# Patient Record
Sex: Female | Born: 1980 | Race: Black or African American | Hispanic: No | Marital: Married | Smoking: Former smoker
Health system: Southern US, Community
[De-identification: ages and names within clinical notes are randomized; demographics above are authoritative.]

## PROBLEM LIST (undated history)

## (undated) DIAGNOSIS — F410 Panic disorder [episodic paroxysmal anxiety] without agoraphobia: Secondary | ICD-10-CM

## (undated) DIAGNOSIS — F419 Anxiety disorder, unspecified: Secondary | ICD-10-CM

## (undated) DIAGNOSIS — Z9889 Other specified postprocedural states: Secondary | ICD-10-CM

## (undated) DIAGNOSIS — M797 Fibromyalgia: Secondary | ICD-10-CM

## (undated) DIAGNOSIS — F99 Mental disorder, not otherwise specified: Secondary | ICD-10-CM

## (undated) DIAGNOSIS — E785 Hyperlipidemia, unspecified: Secondary | ICD-10-CM

## (undated) DIAGNOSIS — F32A Depression, unspecified: Secondary | ICD-10-CM

## (undated) DIAGNOSIS — K5900 Constipation, unspecified: Secondary | ICD-10-CM

## (undated) DIAGNOSIS — M549 Dorsalgia, unspecified: Secondary | ICD-10-CM

## (undated) DIAGNOSIS — D649 Anemia, unspecified: Secondary | ICD-10-CM

## (undated) DIAGNOSIS — F329 Major depressive disorder, single episode, unspecified: Secondary | ICD-10-CM

## (undated) DIAGNOSIS — Z9289 Personal history of other medical treatment: Secondary | ICD-10-CM

## (undated) DIAGNOSIS — R112 Nausea with vomiting, unspecified: Secondary | ICD-10-CM

## (undated) HISTORY — DX: Constipation, unspecified: K59.00

## (undated) HISTORY — DX: Fibromyalgia: M79.7

## (undated) HISTORY — DX: Mental disorder, not otherwise specified: F99

## (undated) HISTORY — DX: Dorsalgia, unspecified: M54.9

## (undated) HISTORY — PX: TUBAL LIGATION: SHX77

## (undated) SURGERY — Surgical Case
Anesthesia: *Unknown

---
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
## 2001-12-14 ENCOUNTER — Emergency Department (HOSPITAL_COMMUNITY): Admission: EM | Admit: 2001-12-14 | Discharge: 2001-12-14 | Payer: Self-pay | Admitting: Emergency Medicine

## 2001-12-15 ENCOUNTER — Emergency Department (HOSPITAL_COMMUNITY): Admission: EM | Admit: 2001-12-15 | Discharge: 2001-12-16 | Payer: Self-pay | Admitting: Emergency Medicine

## 2002-03-28 ENCOUNTER — Emergency Department (HOSPITAL_COMMUNITY): Admission: EM | Admit: 2002-03-28 | Discharge: 2002-03-28 | Payer: Self-pay | Admitting: Emergency Medicine

## 2002-05-15 ENCOUNTER — Inpatient Hospital Stay (HOSPITAL_COMMUNITY): Admission: AD | Admit: 2002-05-15 | Discharge: 2002-05-15 | Payer: Self-pay | Admitting: *Deleted

## 2002-05-15 ENCOUNTER — Encounter: Payer: Self-pay | Admitting: *Deleted

## 2002-05-18 ENCOUNTER — Inpatient Hospital Stay (HOSPITAL_COMMUNITY): Admission: AD | Admit: 2002-05-18 | Discharge: 2002-05-18 | Payer: Self-pay | Admitting: *Deleted

## 2002-06-06 ENCOUNTER — Inpatient Hospital Stay (HOSPITAL_COMMUNITY): Admission: AD | Admit: 2002-06-06 | Discharge: 2002-06-06 | Payer: Self-pay | Admitting: *Deleted

## 2002-06-11 ENCOUNTER — Inpatient Hospital Stay (HOSPITAL_COMMUNITY): Admission: AD | Admit: 2002-06-11 | Discharge: 2002-06-11 | Payer: Self-pay | Admitting: Family Medicine

## 2002-06-12 ENCOUNTER — Encounter: Payer: Self-pay | Admitting: *Deleted

## 2002-06-15 DIAGNOSIS — Z9289 Personal history of other medical treatment: Secondary | ICD-10-CM

## 2002-06-15 HISTORY — DX: Personal history of other medical treatment: Z92.89

## 2002-06-16 ENCOUNTER — Inpatient Hospital Stay (HOSPITAL_COMMUNITY): Admission: AD | Admit: 2002-06-16 | Discharge: 2002-06-16 | Payer: Self-pay | Admitting: *Deleted

## 2002-06-19 ENCOUNTER — Encounter (INDEPENDENT_AMBULATORY_CARE_PROVIDER_SITE_OTHER): Payer: Self-pay | Admitting: Specialist

## 2002-06-19 ENCOUNTER — Inpatient Hospital Stay (HOSPITAL_COMMUNITY): Admission: AD | Admit: 2002-06-19 | Discharge: 2002-06-23 | Payer: Self-pay | Admitting: Family Medicine

## 2002-06-24 ENCOUNTER — Inpatient Hospital Stay (HOSPITAL_COMMUNITY): Admission: AD | Admit: 2002-06-24 | Discharge: 2002-06-26 | Payer: Self-pay | Admitting: Obstetrics and Gynecology

## 2002-06-28 ENCOUNTER — Inpatient Hospital Stay (HOSPITAL_COMMUNITY): Admission: AD | Admit: 2002-06-28 | Discharge: 2002-06-28 | Payer: Self-pay | Admitting: Family Medicine

## 2003-10-24 ENCOUNTER — Inpatient Hospital Stay (HOSPITAL_COMMUNITY): Admission: AD | Admit: 2003-10-24 | Discharge: 2003-10-24 | Payer: Self-pay | Admitting: Obstetrics & Gynecology

## 2003-11-07 ENCOUNTER — Inpatient Hospital Stay (HOSPITAL_COMMUNITY): Admission: AD | Admit: 2003-11-07 | Discharge: 2003-11-07 | Payer: Self-pay | Admitting: Obstetrics & Gynecology

## 2003-11-07 IMAGING — US US OB COMP LESS 14 WK
1 series · 19 of 28 positions shown · non-contrast
Comparison: none

CLINICAL DATA: Recurrent urinary tract infection/positive urine pregnancy test.
 EARLY OBSTETRICAL ULTRASOUND:
 There is a single fetus with an estimated gestational age of 8 weeks 6 days based on crown rump length.  Heart rate is 176 bpm.  Amniotic fluid is subjectively normal.  Yolk sac is visualized.

[Series 1: us ob comp<14 wk · 19 of 35 slices shown]
[im 1/35]
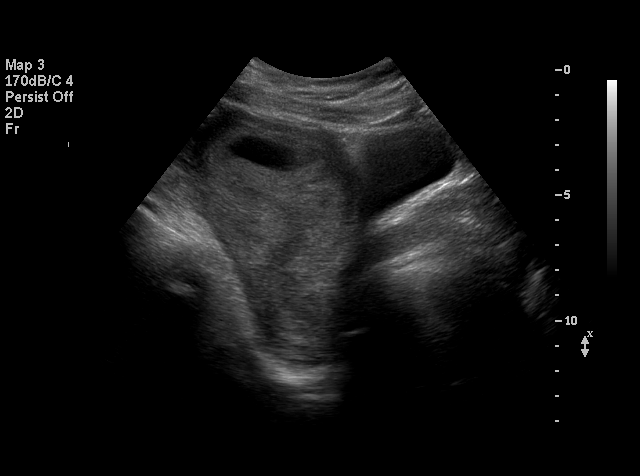
[im 3/35]
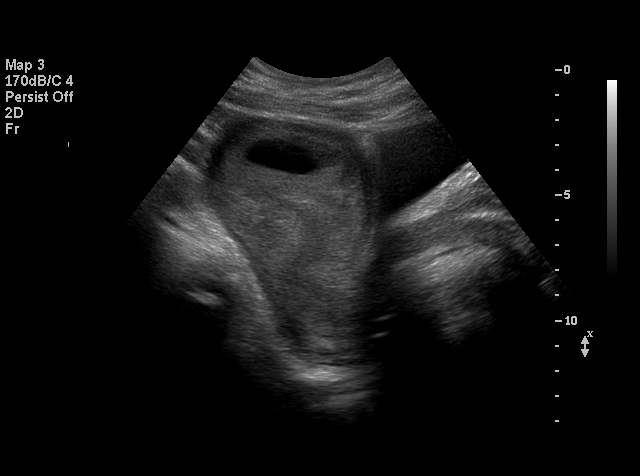
[im 4/35]
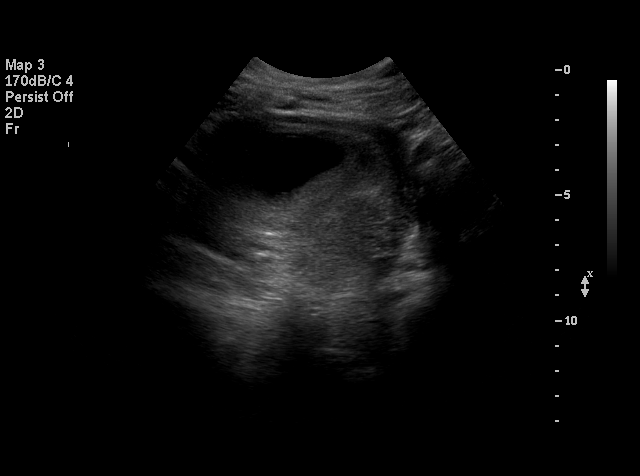
[im 7/35]
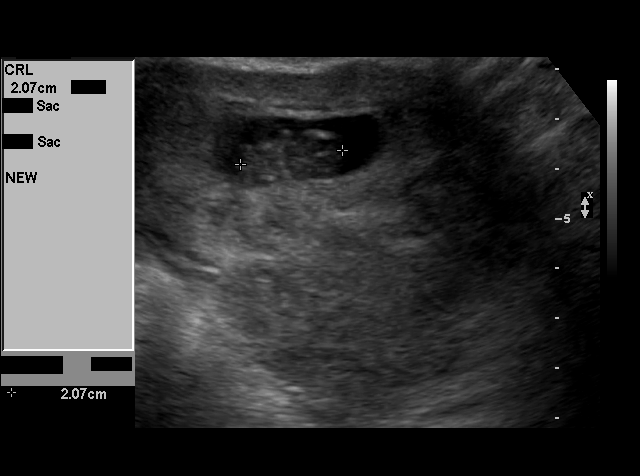
[im 8/35]
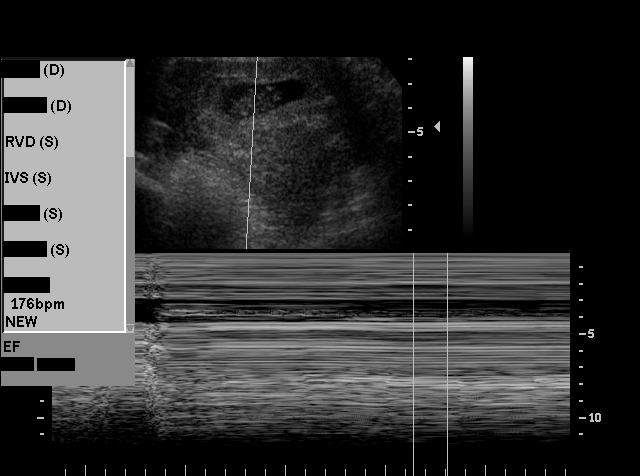
[im 11/35]
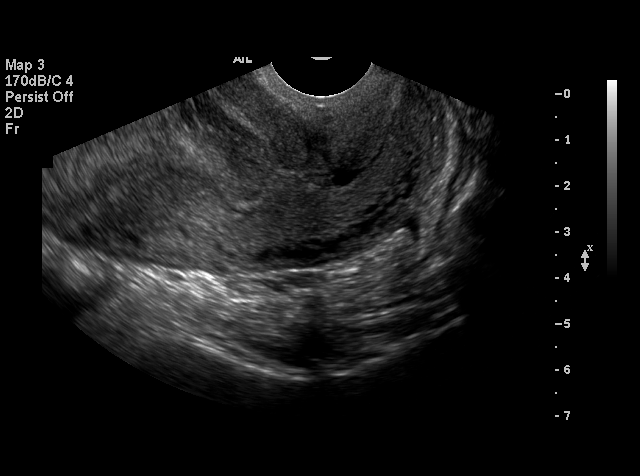
[im 12/35]
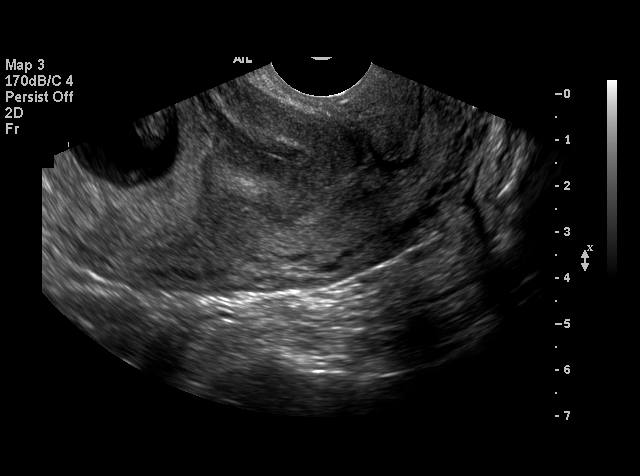
[im 14/35]
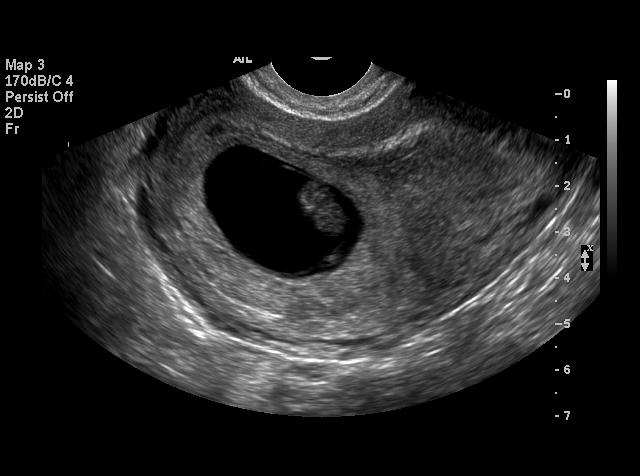
[im 16/35]
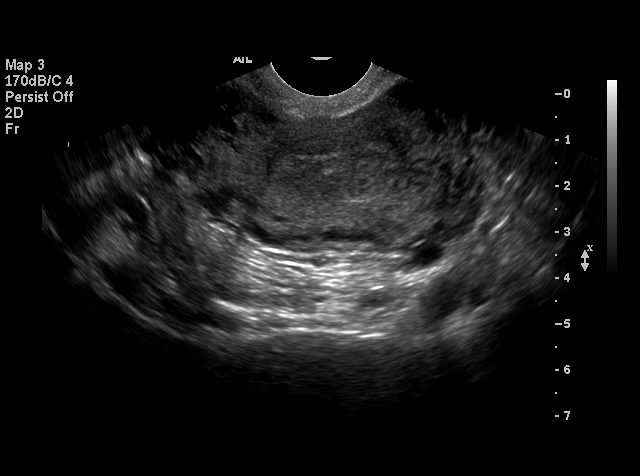
[im 18/35]
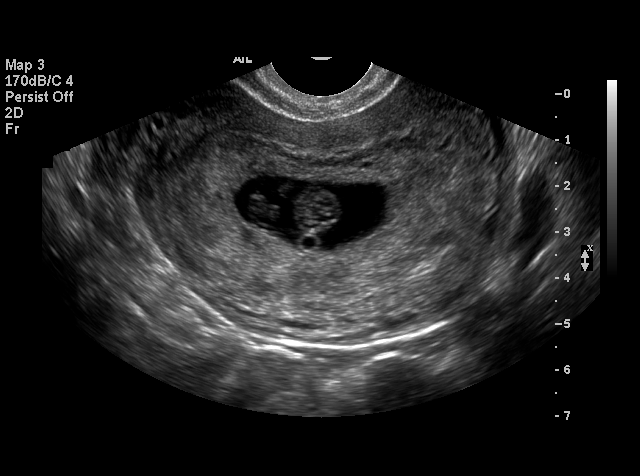
[im 19/35]
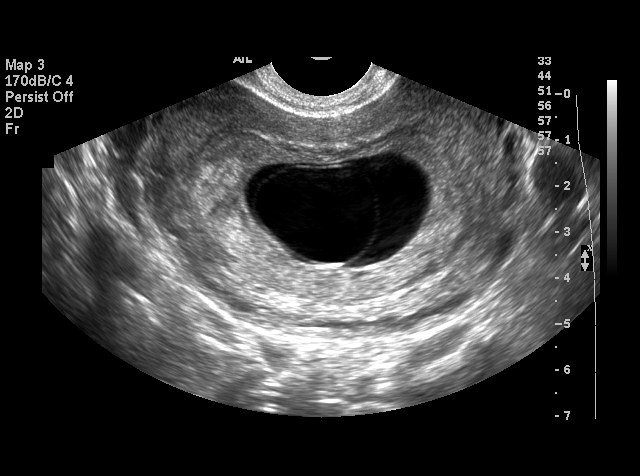
[im 21/35]
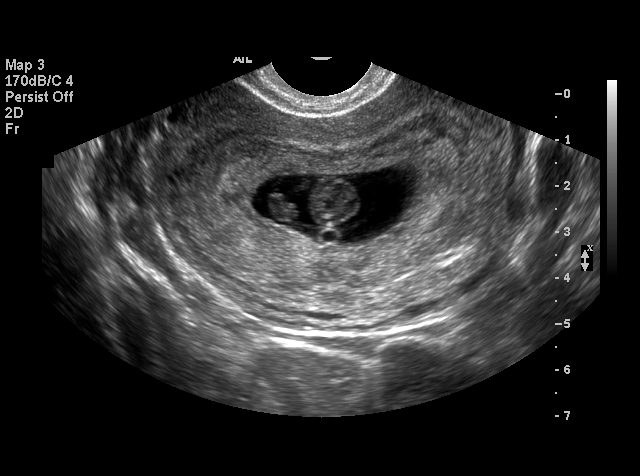
[im 23/35]
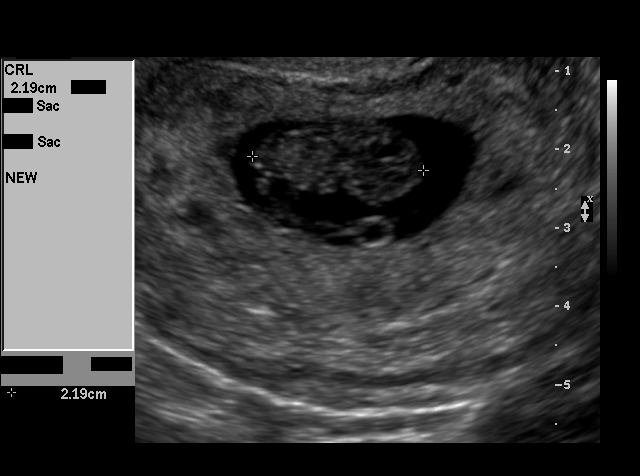
[im 24/35]
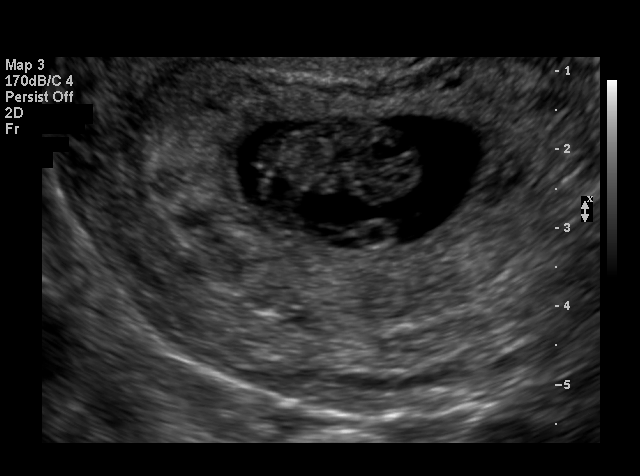
[im 27/35]
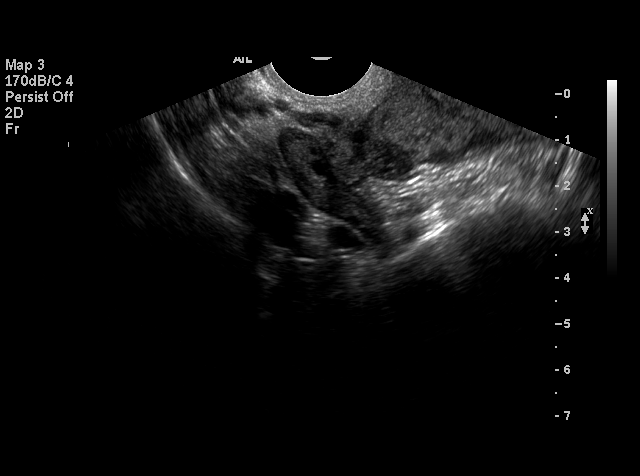
[im 28/35]
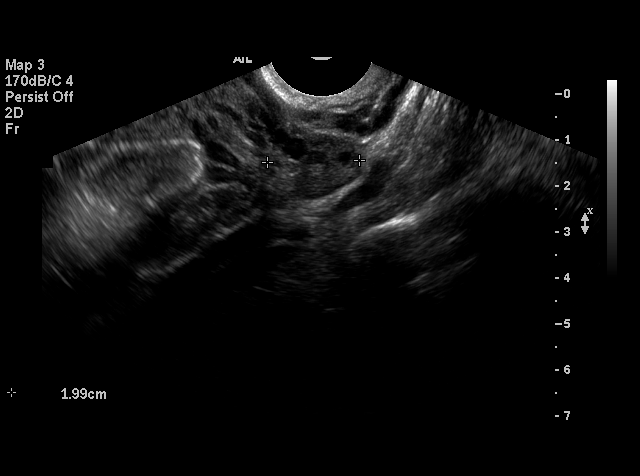
[im 31/35]
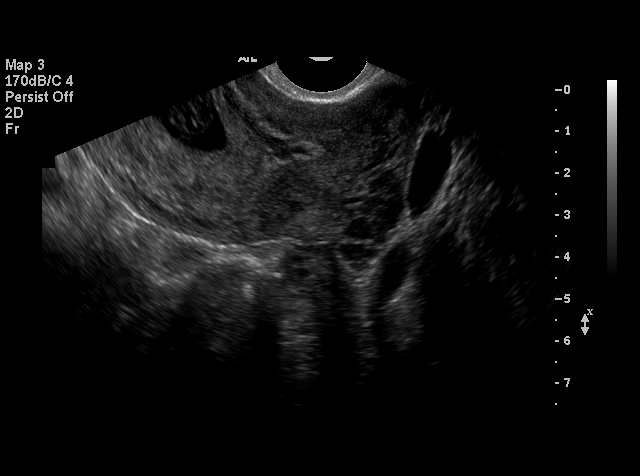
[im 32/35]
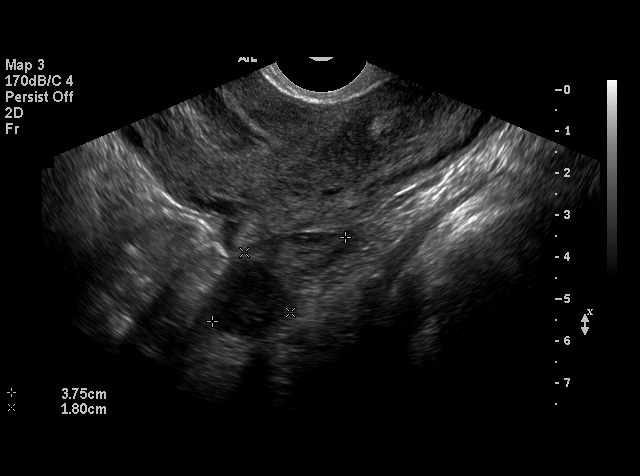
[im 35/35]
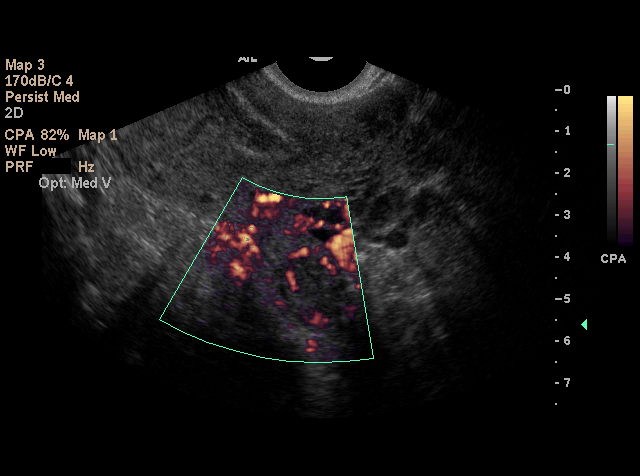

[19 of 28 positions shown; findings below may reference images not displayed]

IMPRESSION: Single, alive intrauterine fetus, with heart rate of 176 bpm.  Gestational age estimated at 8 weeks 6 days.

## 2004-01-05 ENCOUNTER — Ambulatory Visit (HOSPITAL_COMMUNITY): Admission: RE | Admit: 2004-01-05 | Discharge: 2004-01-05 | Payer: Self-pay | Admitting: Obstetrics & Gynecology

## 2004-01-05 IMAGING — US US OB COMP +14 WK
1 series · 13 of 28 positions shown · non-contrast
Comparison: none

CLINICAL DATA: Anatomic exam.

[Series 1: us ob comp +14 wk · 0.37mm/px · 13 of 76 slices shown]
[im 3/76]
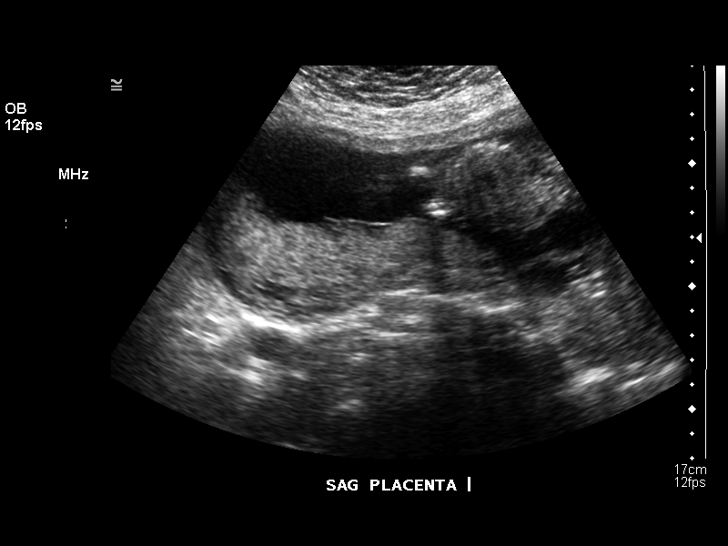
[im 9/76]
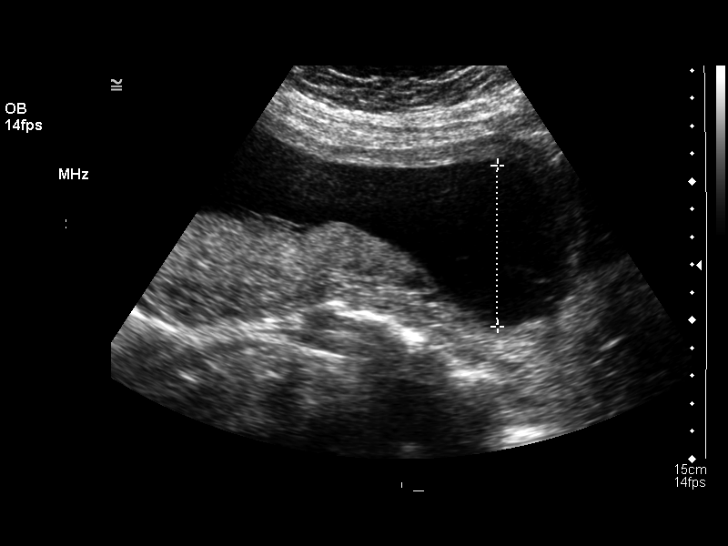
[im 14/76]
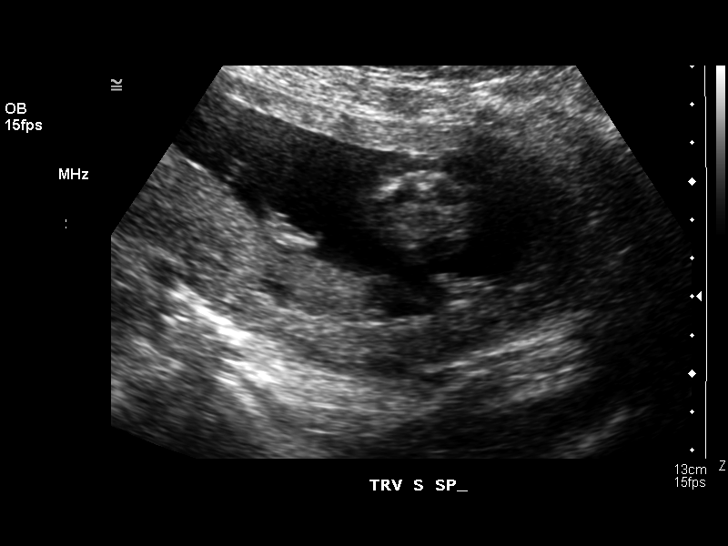
[im 20/76]
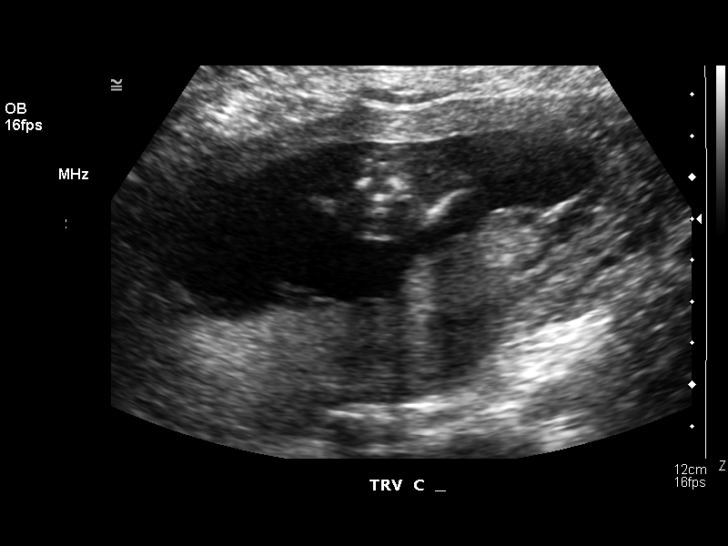
[im 26/76]
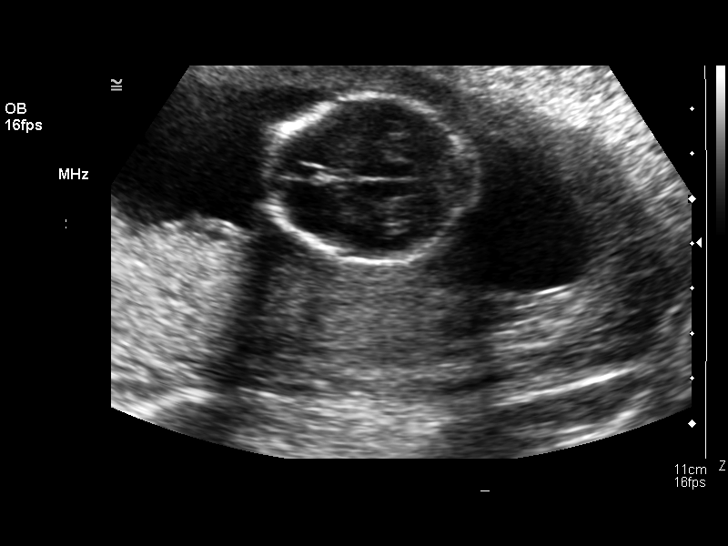
[im 31/76]
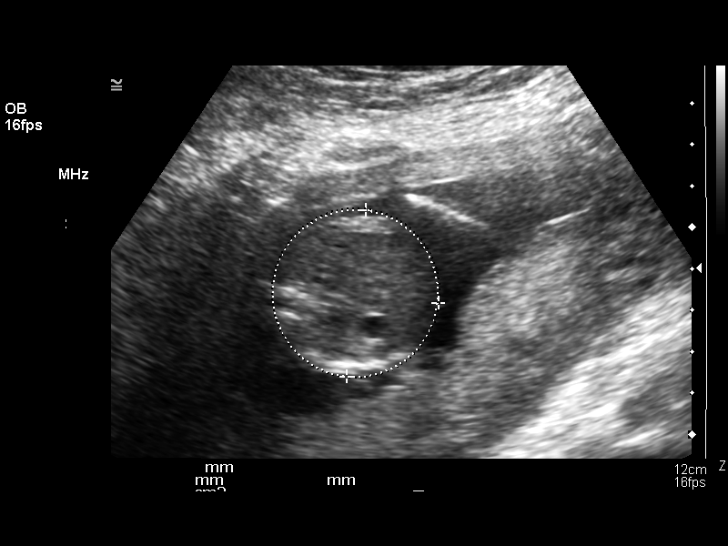
[im 39/76]
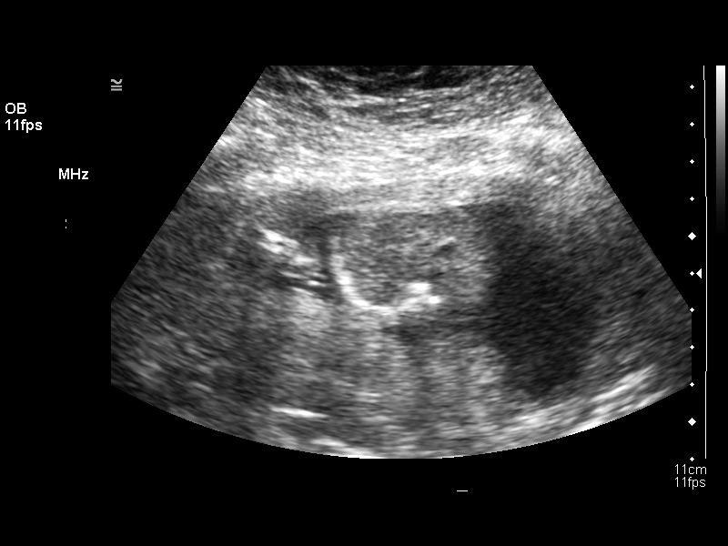
[im 45/76]
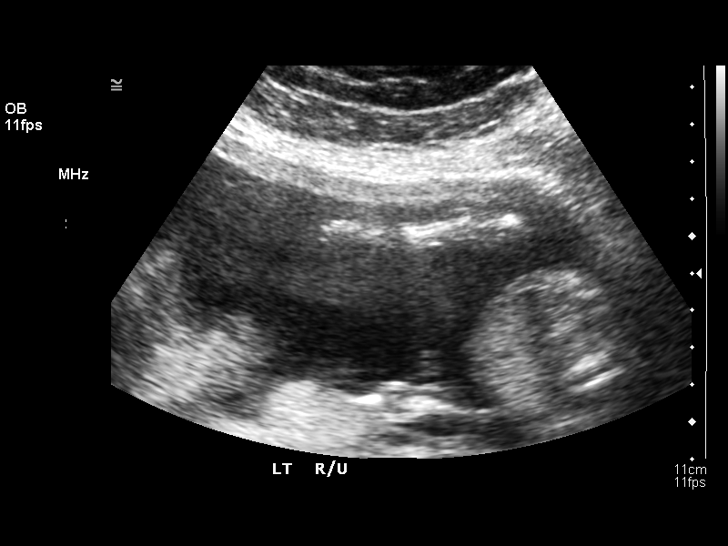
[im 51/76]
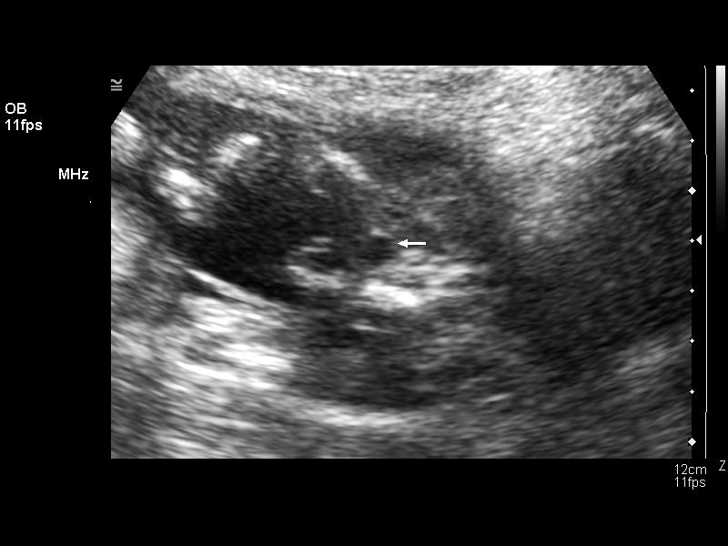
[im 56/76]
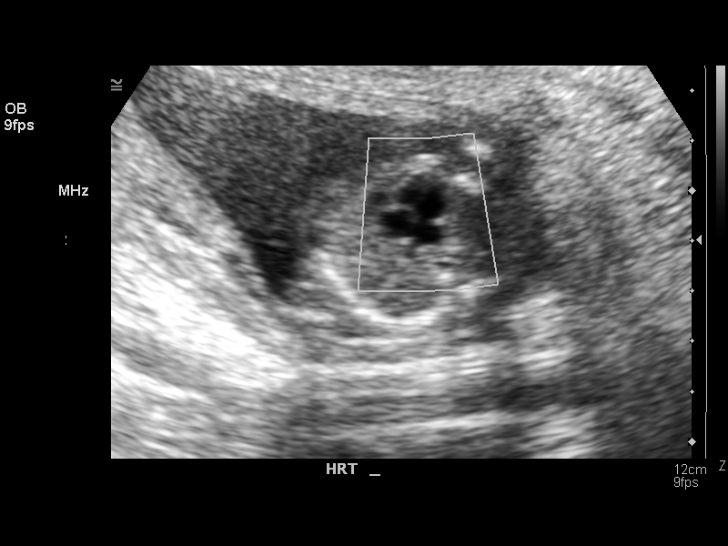
[im 62/76]
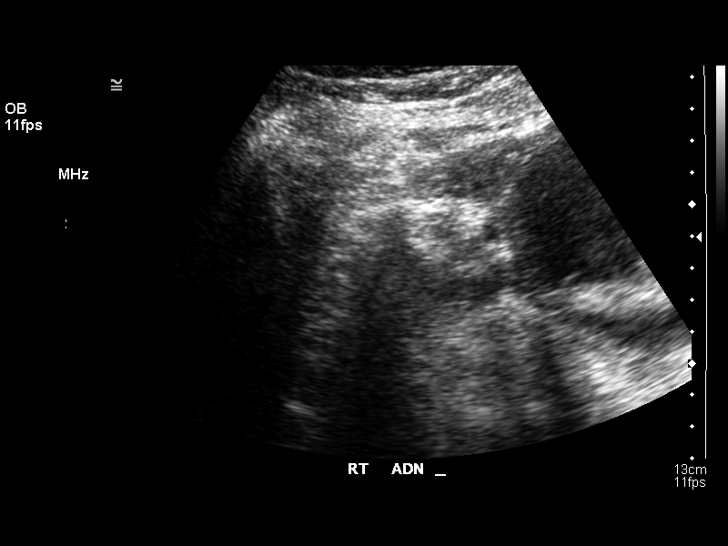
[im 67/76]
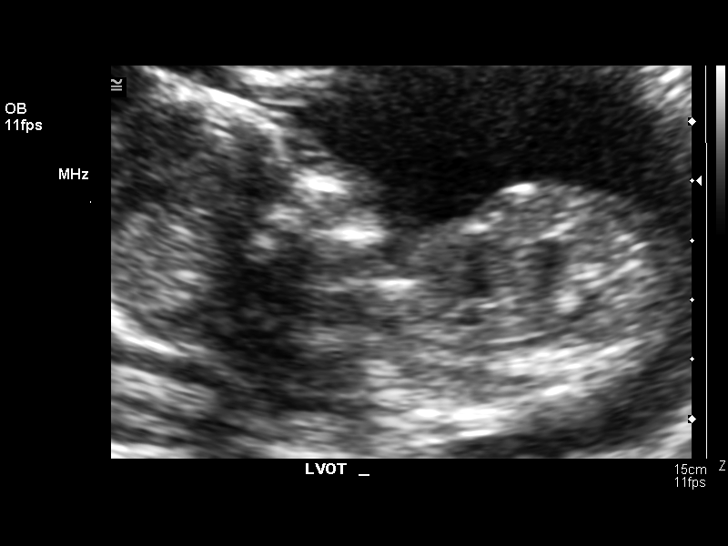
[im 73/76]
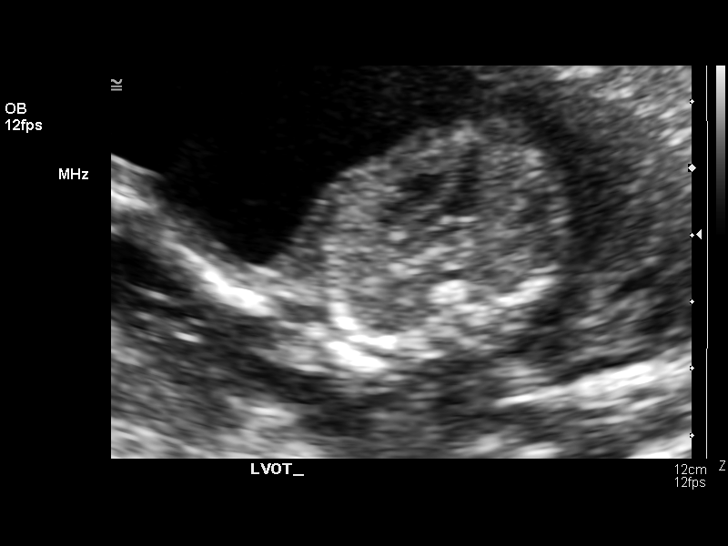

[13 of 28 positions shown; findings below may reference images not displayed]

OBSTETRICAL ULTRASOUND:
Number of Fetuses:  1
Heart Rate:  141
Movement:  Yes
Breathing:    No  
Presentation:  Breech
Placental Location:  Posterior
Grade:  I
Previa:  No
Amniotic Fluid (Subjective):  Normal
Amniotic Fluid (Objective):   5.8 cm Vertical pocket 

FETAL BIOMETRY
BPD:  3.8 cm   17 w 4 d
HC:  14.5 cm  17 w 5 d
AC:  13.0 cm   18 w 2 d
FL:    2.6 cm   18 w 0 d

MEAN GA:  17 w 6 d

FETAL ANATOMY
Lateral Ventricles:    Visualized 
Thalami/CSP:      Visualized 
Posterior Fossa:  Visualized 
Nuchal Region:    Visualized 
Spine:      Visualized 
4 Chamber Heart on Left:      Visualized 
Stomach on Left:      Visualized 
3 Vessel Cord:    Visualized 
Cord Insertion site:    Visualized 
Kidneys:  Visualized 
Bladder:  Visualized 
Extremities:      Visualized 

ADDITIONAL ANATOMY VISUALIZED:  LVOT, RVOT, upper lip, orbits, profile, diaphragm, heel, 5th digit, ductal arch, aortic arch, and female genitalia.  

MATERNAL UTERINE AND ADNEXAL FINDINGS
Cervix:   3.2 cm Transabdominally
IMPRESSION: 1.  Single intrauterine pregnancy demonstrating an estimated gestational age by ultrasound of 17 weeks and 6 days.  Correlation with assigned gestational age by initial ultrasound of 17 weeks and 4 days suggests appropriate growth.  
2.  No focal fetal or placental abnormalities are noted with a reasonable anatomic exam possible.  Overall scan resolution was somewhat limited by maternal body habitus.  

</u12:p>

## 2004-03-21 ENCOUNTER — Inpatient Hospital Stay (HOSPITAL_COMMUNITY): Admission: AD | Admit: 2004-03-21 | Discharge: 2004-03-21 | Payer: Self-pay | Admitting: Obstetrics & Gynecology

## 2004-03-21 IMAGING — US US OB FOLLOW-UP
1 series · 13 of 28 positions shown · non-contrast
Comparison: none

<!--  IDXRADR:ADDEND:BEGIN -->Addendum Begins<!--  IDXRADR:ADDEND:INNER_BEGIN -->DUPLICATE COPY for exam association in [facility].  No change to original report. ([DATE])

 <!--  IDXRADR:ADDEND:INNER_END -->Addendum Ends
<!--  IDXRADR:ADDEND:END -->Clinical data: Back pain. Headache. Pregnant.
OBSTETRICAL ULTRASOUND RE-EVALUATION WITH TRANSVAGINAL:
Number of fetuses:  1
Heart rate:  150
Movement:    Yes
Breathing:  No
Presentation:   Cephalic
Placental Location:  Posterior
Placental Grade:   I
Placenta Previa:    No
Amniotic Fluid (Subjective):   Normal
Amniotic Fluid (Objective):   11.3 cm AFI
FETAL BIOMETRY:
BPD:  7.2 cm 28 w 5 d
HC:    25.5 cm 27 w 5 d
AC:    24.2 cm 28 w 3 d
FL:     5.3 cm 28 w 2 d
Mean GA:     28 w 2 d
EFW:  [HO] g (H) 75th - 90th%ile ([HO] - [HO] g) for 28 wks
FETAL ANATOMY
Lateral Ventricle:    Visualized
Thalamus/CSP:     Previously seen 
Posterior Fossa:    Previously seen 
Nuchal Region:     Previously seen 
Spine:                 Previously seen 
4-Chamber Heart:     Previously seen 
Stomach:          Visualized 
3-Vessel Cord:   Previously seen 
Abd Cord Insert:   Previously seen 
Kidneys:           Visualized 
Bladder:            Visualized 
Extremities:        Previously seen 
Limited by:   Fetal position
MATERNAL UTERINE AND ADNEXAL FINDINGS
Cervix: 2.8 cm Transvaginally; 2.3 cm with fundal pressure

[Series 1: us ob follow-up · 0.35mm/px · 13 of 32 slices shown]
[im 2/32]
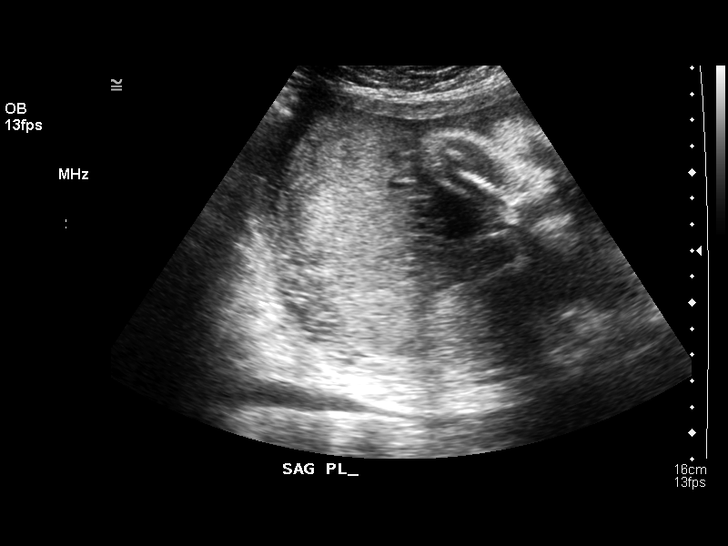
[im 4/32]
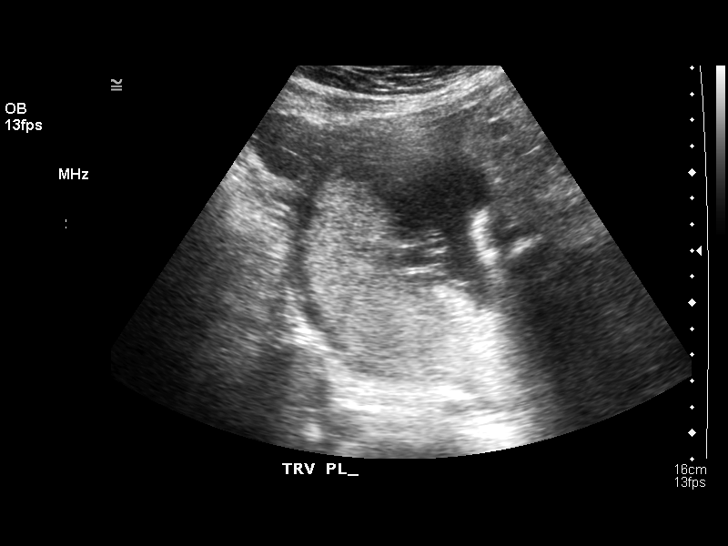
[im 6/32]
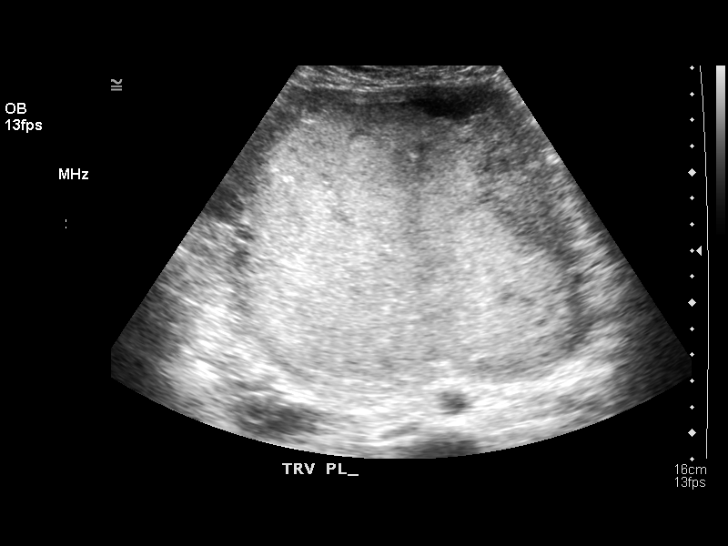
[im 9/32]
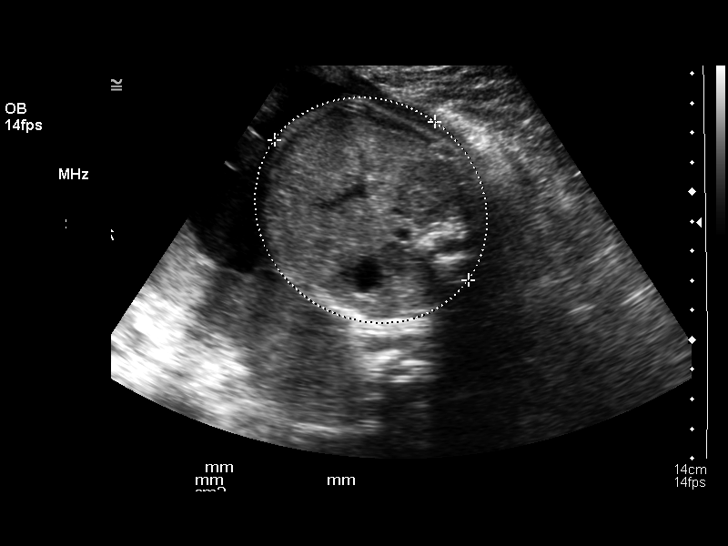
[im 11/32]
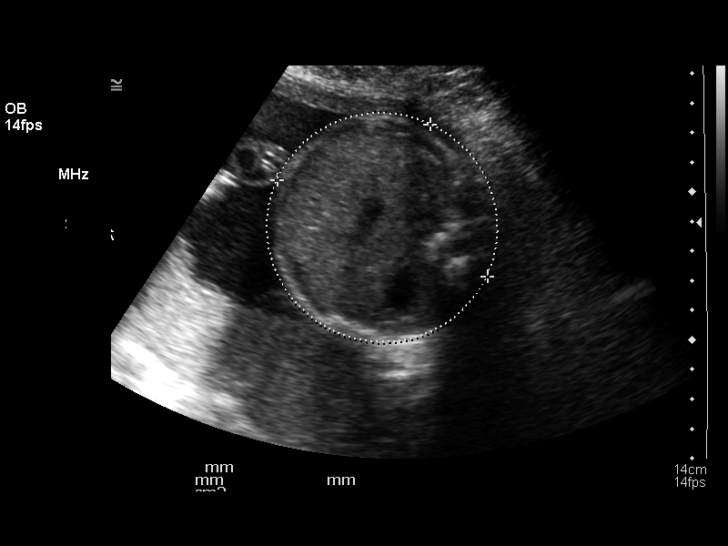
[im 13/32]
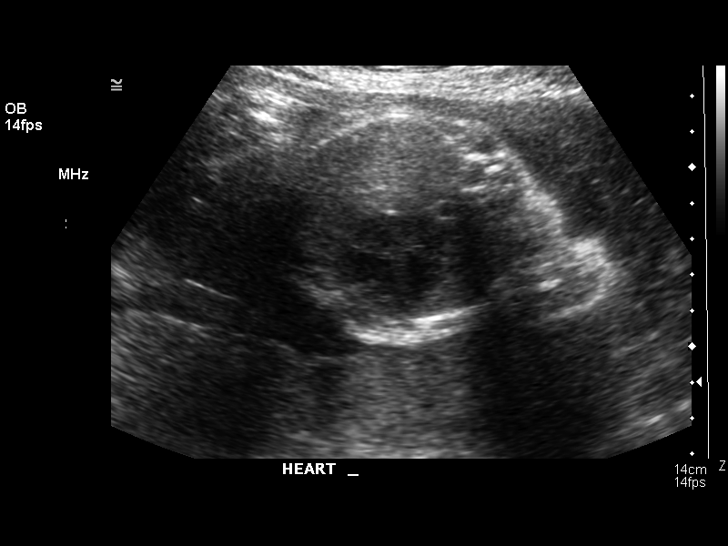
[im 17/32]
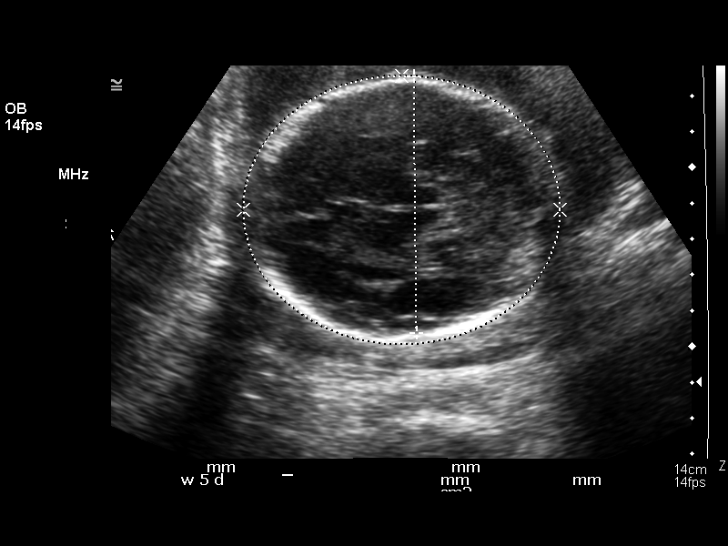
[im 19/32]
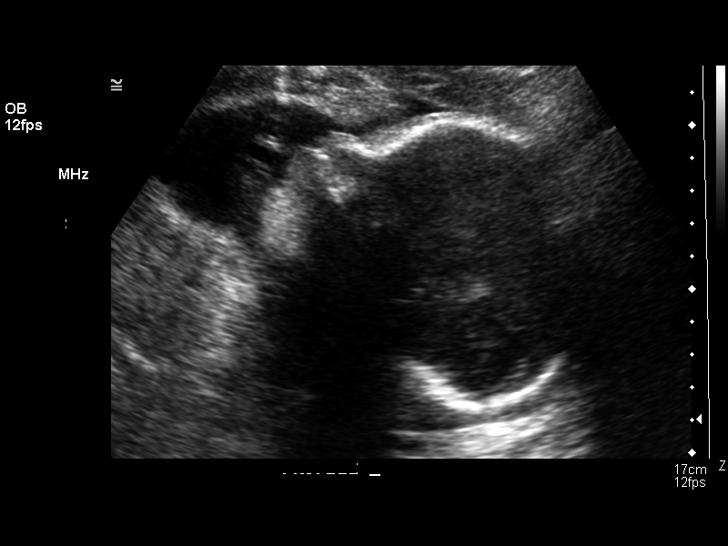
[im 21/32]
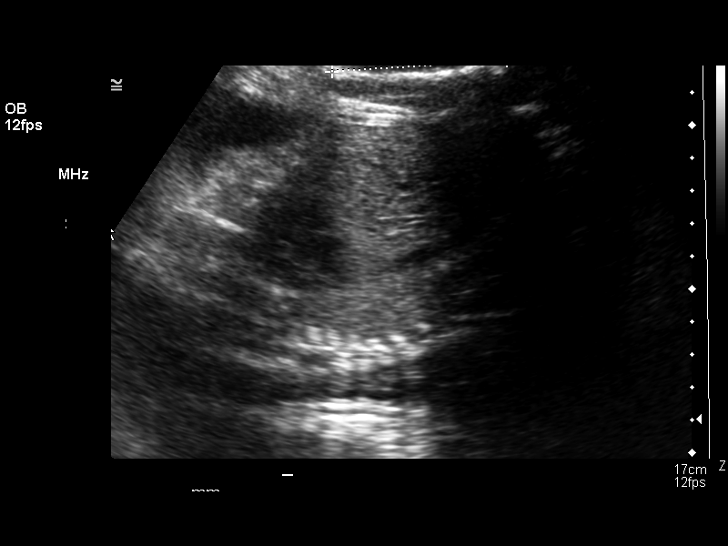
[im 23/32]
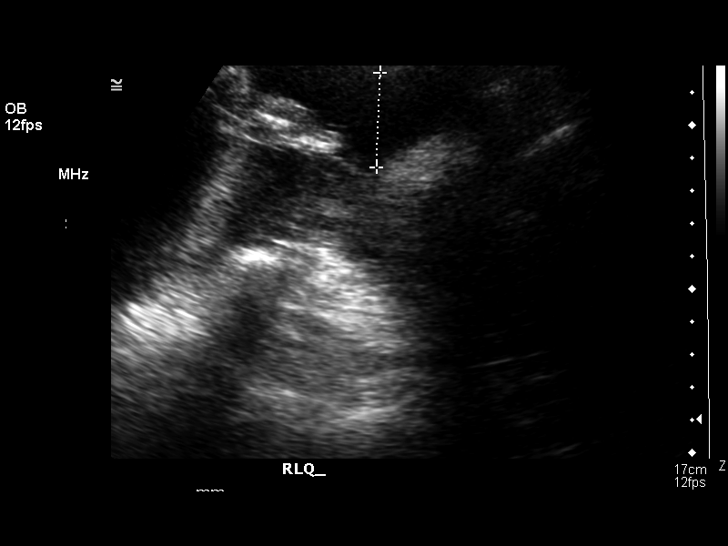
[im 26/32]
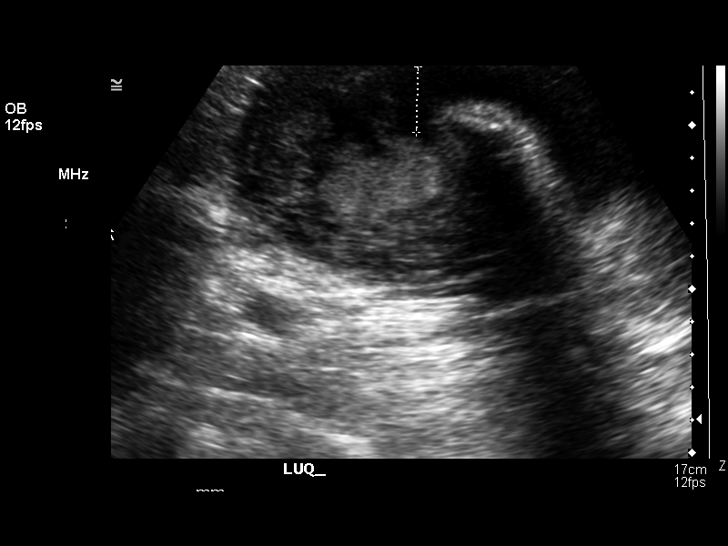
[im 28/32]
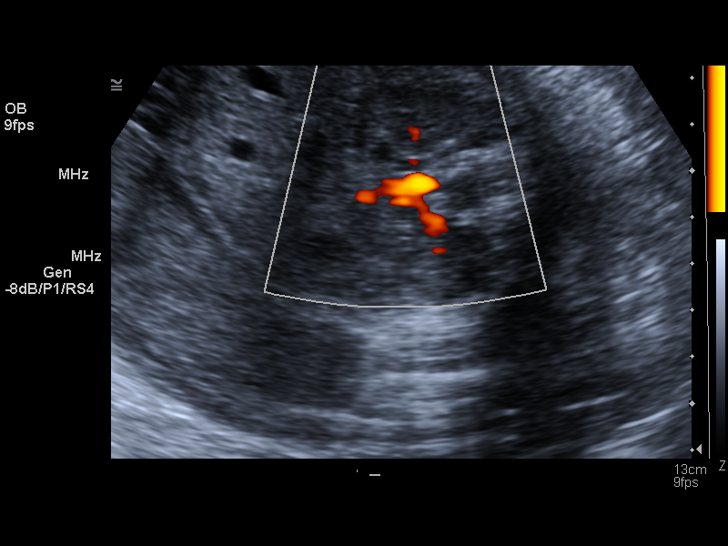
[im 30/32]
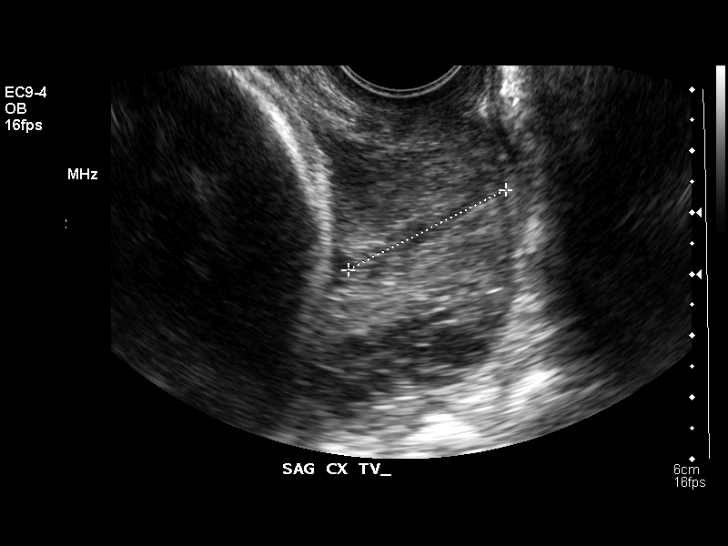

[13 of 28 positions shown; findings below may reference images not displayed]

IMPRESSION: 1. Cervical length 2.3 cm with fundal pressure.
2. Normal amniotic fluid index.
3. Live 28 week 2 day intrauterine gestation.

## 2004-05-26 ENCOUNTER — Inpatient Hospital Stay (HOSPITAL_COMMUNITY): Admission: AD | Admit: 2004-05-26 | Discharge: 2004-05-29 | Payer: Self-pay | Admitting: Obstetrics & Gynecology

## 2004-05-26 ENCOUNTER — Encounter (INDEPENDENT_AMBULATORY_CARE_PROVIDER_SITE_OTHER): Payer: Self-pay | Admitting: *Deleted

## 2004-07-19 ENCOUNTER — Inpatient Hospital Stay (HOSPITAL_COMMUNITY): Admission: AD | Admit: 2004-07-19 | Discharge: 2004-07-19 | Payer: Self-pay | Admitting: Obstetrics

## 2004-12-29 ENCOUNTER — Emergency Department (HOSPITAL_COMMUNITY): Admission: EM | Admit: 2004-12-29 | Discharge: 2004-12-29 | Payer: Self-pay | Admitting: Emergency Medicine

## 2006-05-26 ENCOUNTER — Emergency Department (HOSPITAL_COMMUNITY): Admission: EM | Admit: 2006-05-26 | Discharge: 2006-05-26 | Payer: Self-pay | Admitting: Emergency Medicine

## 2006-07-03 ENCOUNTER — Emergency Department (HOSPITAL_COMMUNITY): Admission: EM | Admit: 2006-07-03 | Discharge: 2006-07-04 | Payer: Self-pay | Admitting: Emergency Medicine

## 2006-07-04 IMAGING — US US ABDOMEN COMPLETE
1 series · 14 of 25 positions shown · non-contrast
Comparison: none

CLINICAL DATA: Abdominal pain. 
 ABDOMEN ULTRASOUND:
TECHNIQUE: Complete abdominal ultrasound examination was performed including evaluation of the liver, gallbladder, bile ducts, pancreas, kidneys, spleen, IVC, and abdominal aorta.

[Series 1: unknown · 0.34mm/px · 14 of 41 slices shown]
[im 1/41]
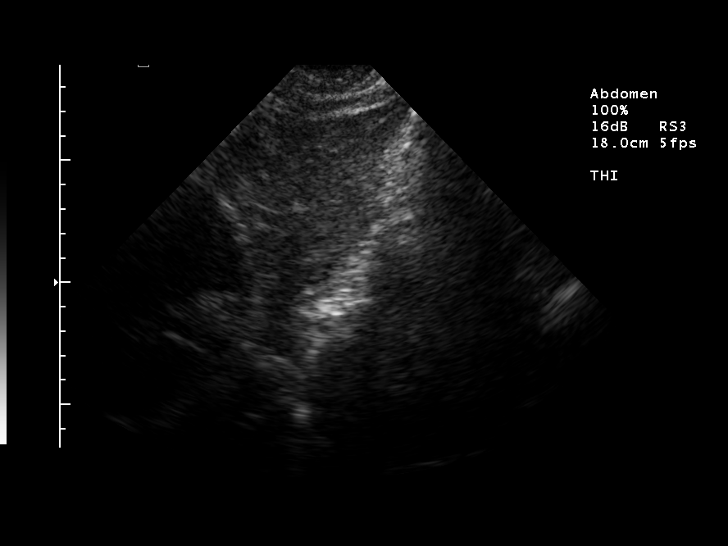
[im 4/41]
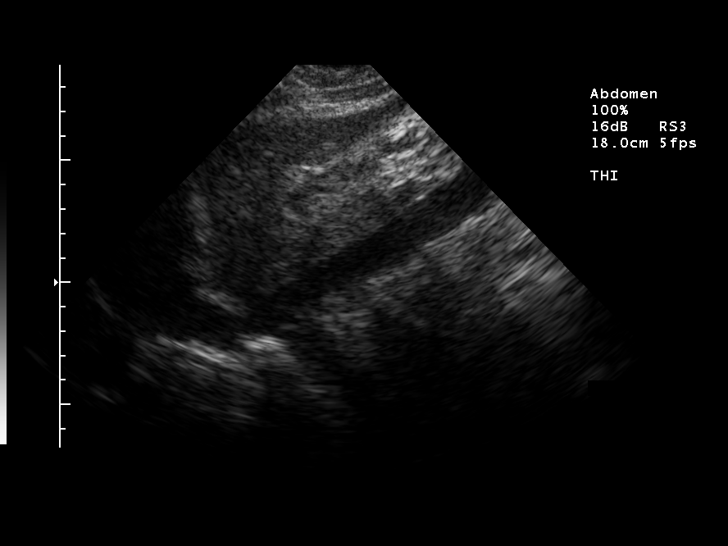
[im 7/41]
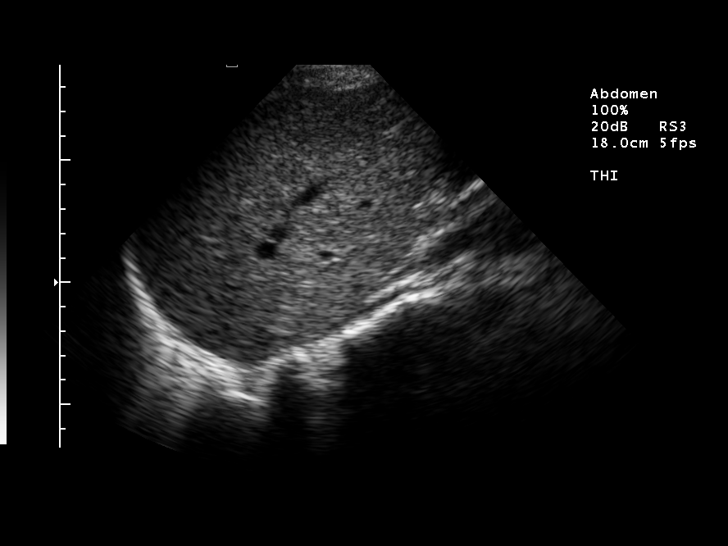
[im 11/41]
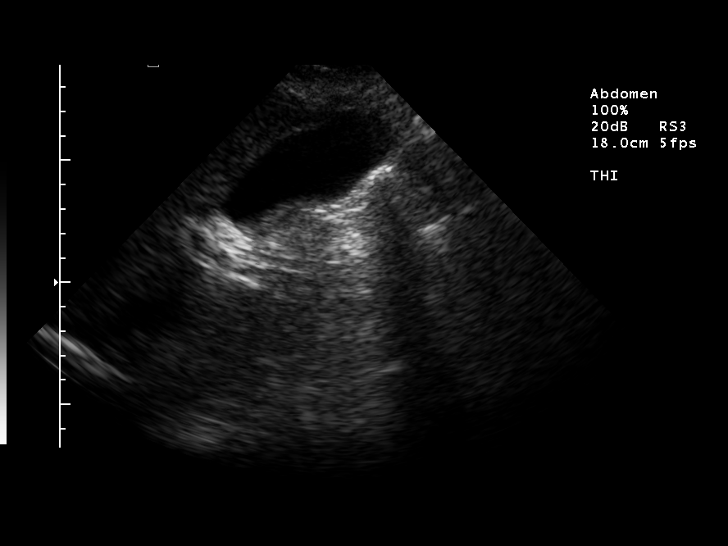
[im 14/41]
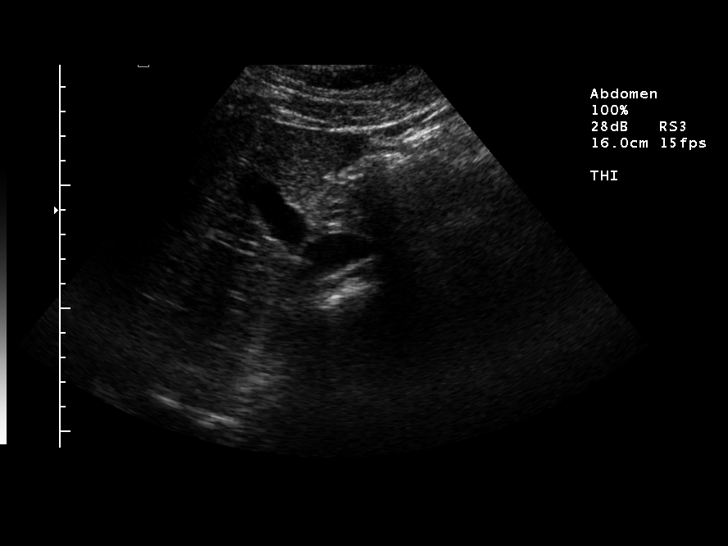
[im 16/41]
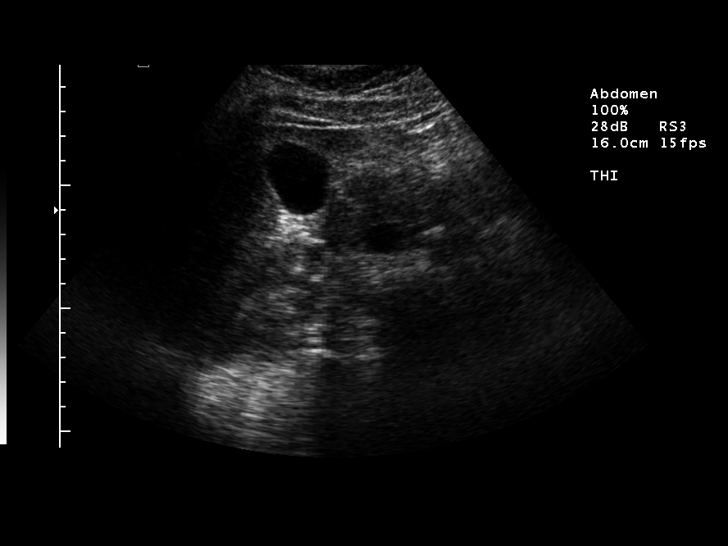
[im 19/41]
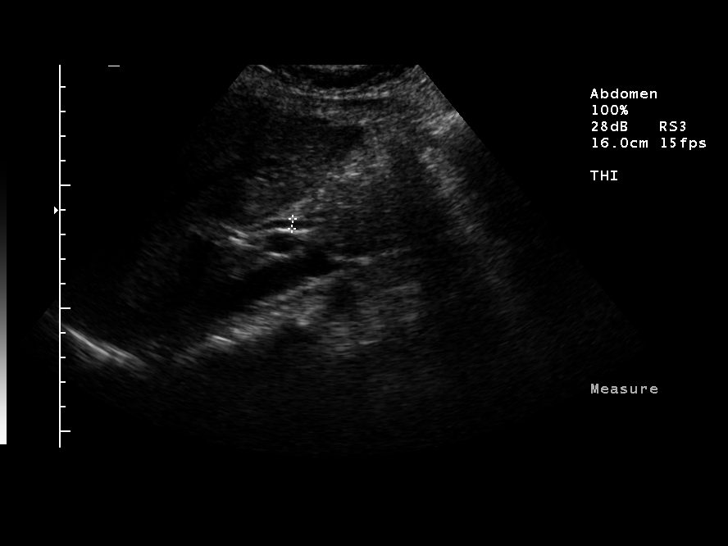
[im 22/41]
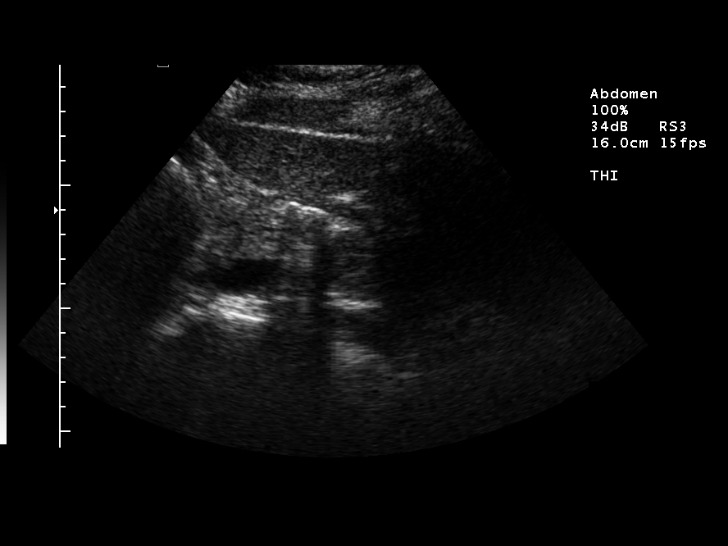
[im 26/41]
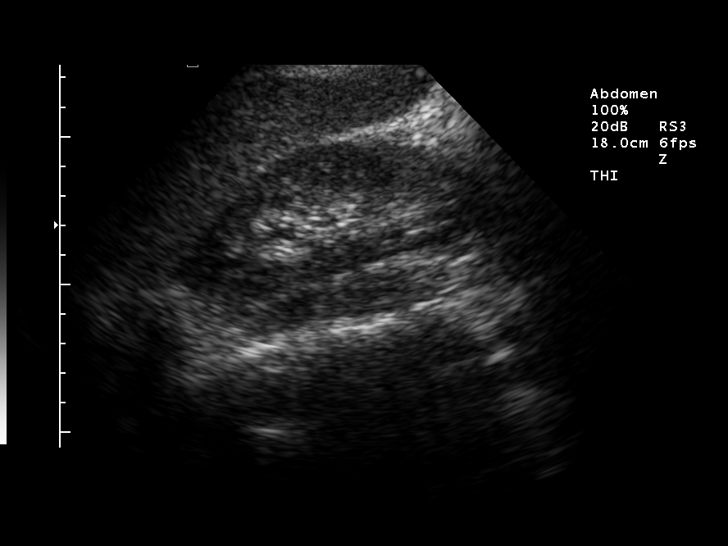
[im 27/41]
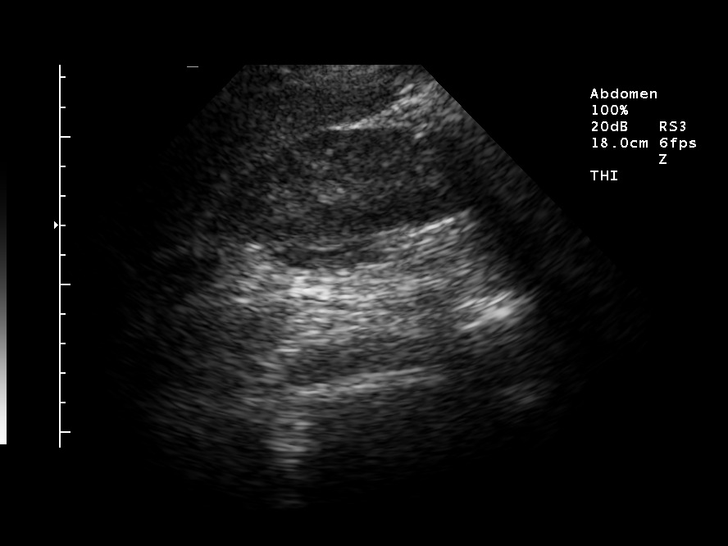
[im 31/41]
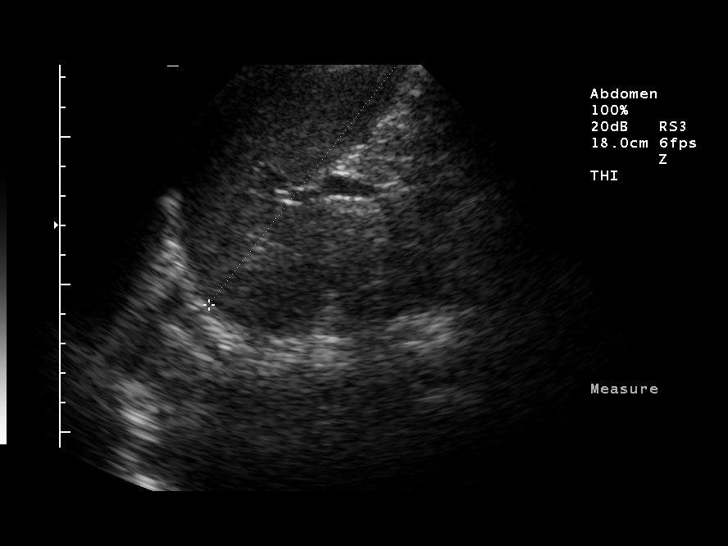
[im 34/41]
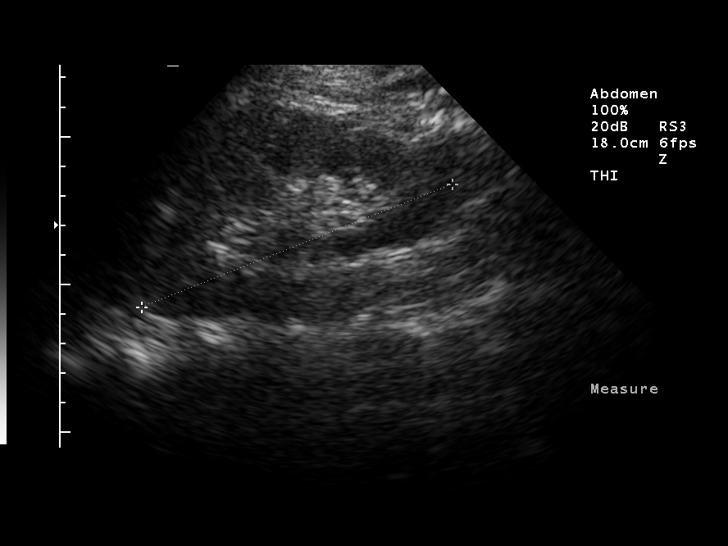
[im 37/41]
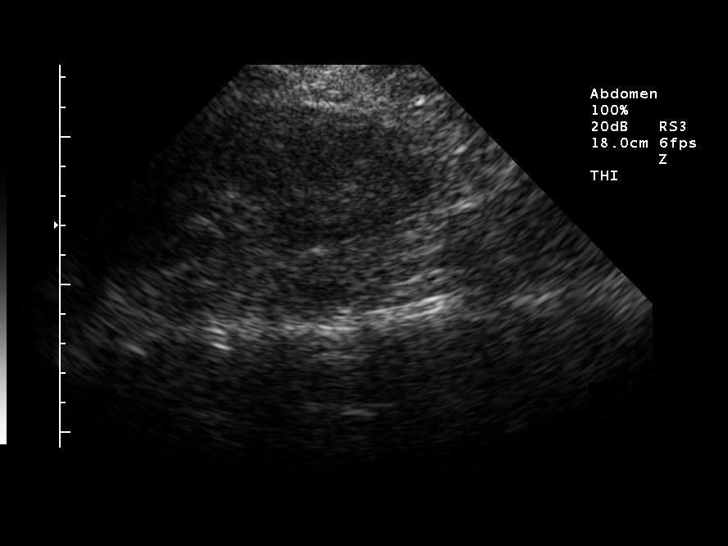
[im 41/41]
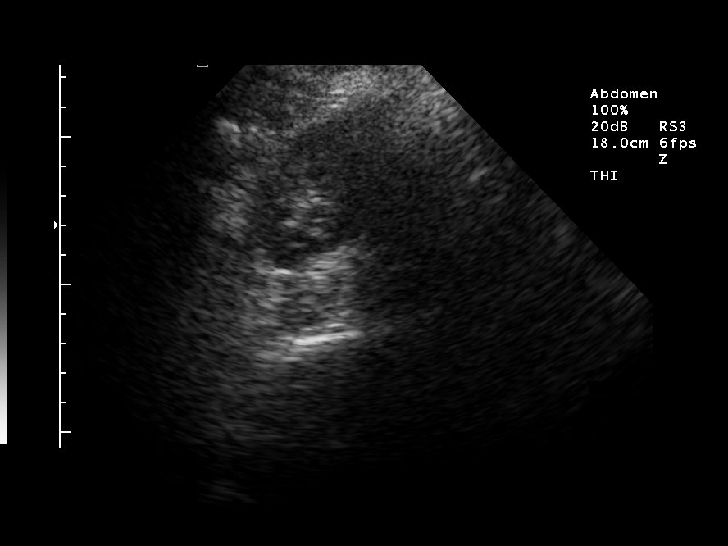

[14 of 25 positions shown; findings below may reference images not displayed]

FINDINGS: There is no evidence of gallstones or biliary ductal dilatation.  The liver is within normal limits in echogenicity, and no focal liver lesions are seen.  The visualized portions of the IVC and pancreas are unremarkable.
 There is no evidence of splenomegaly.  The kidneys are unremarkable, and there is no evidence of hydronephrosis.  The abdominal aorta is non-dilated.
IMPRESSION: Negative abdominal ultrasound.

## 2006-07-04 IMAGING — CR DG CHEST 2V
2 series · 2 of 2 positions shown · non-contrast
Comparison: None.

CLINICAL DATA: Back pain.
 CHEST - 2 VIEW:

[w chest lat]
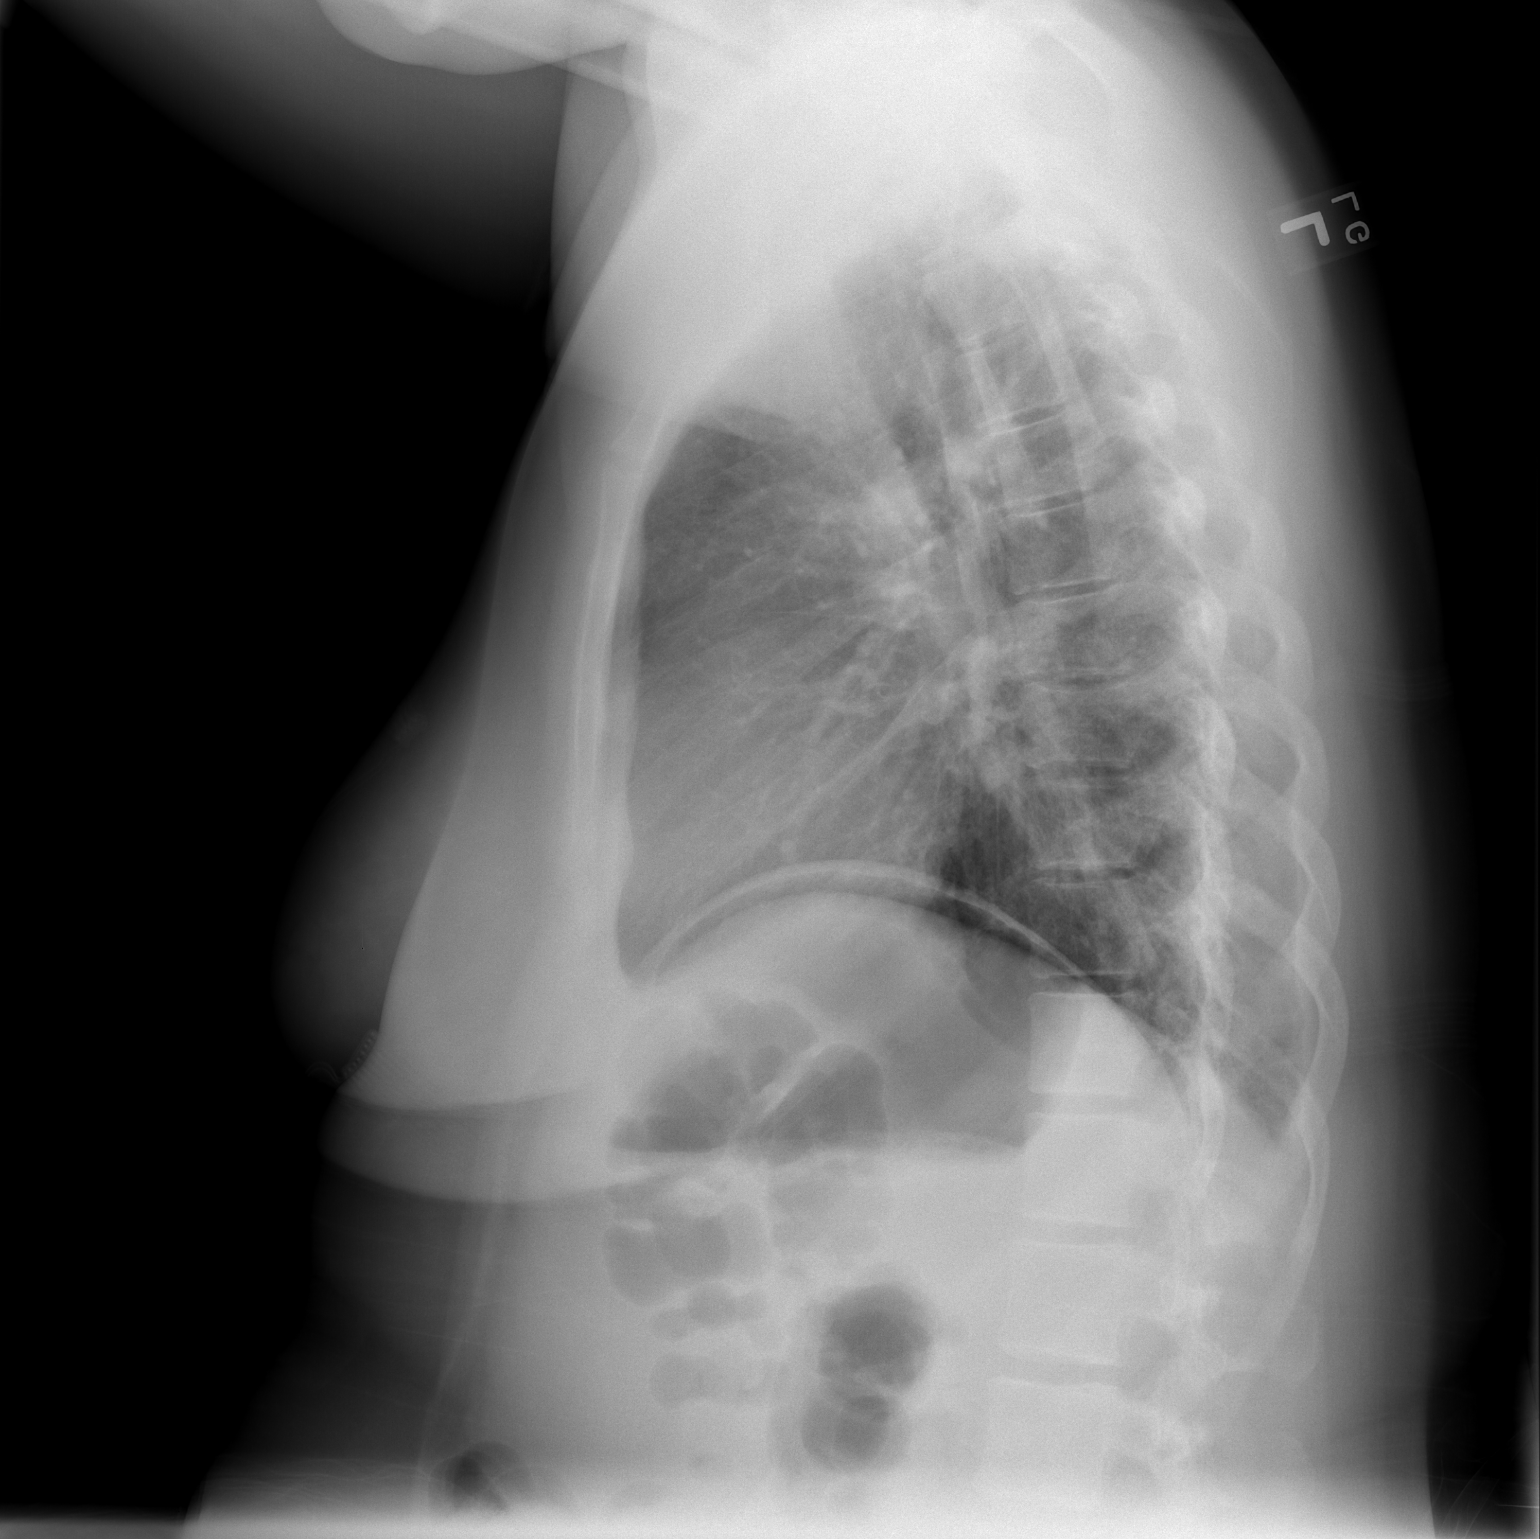

[w chest pa]
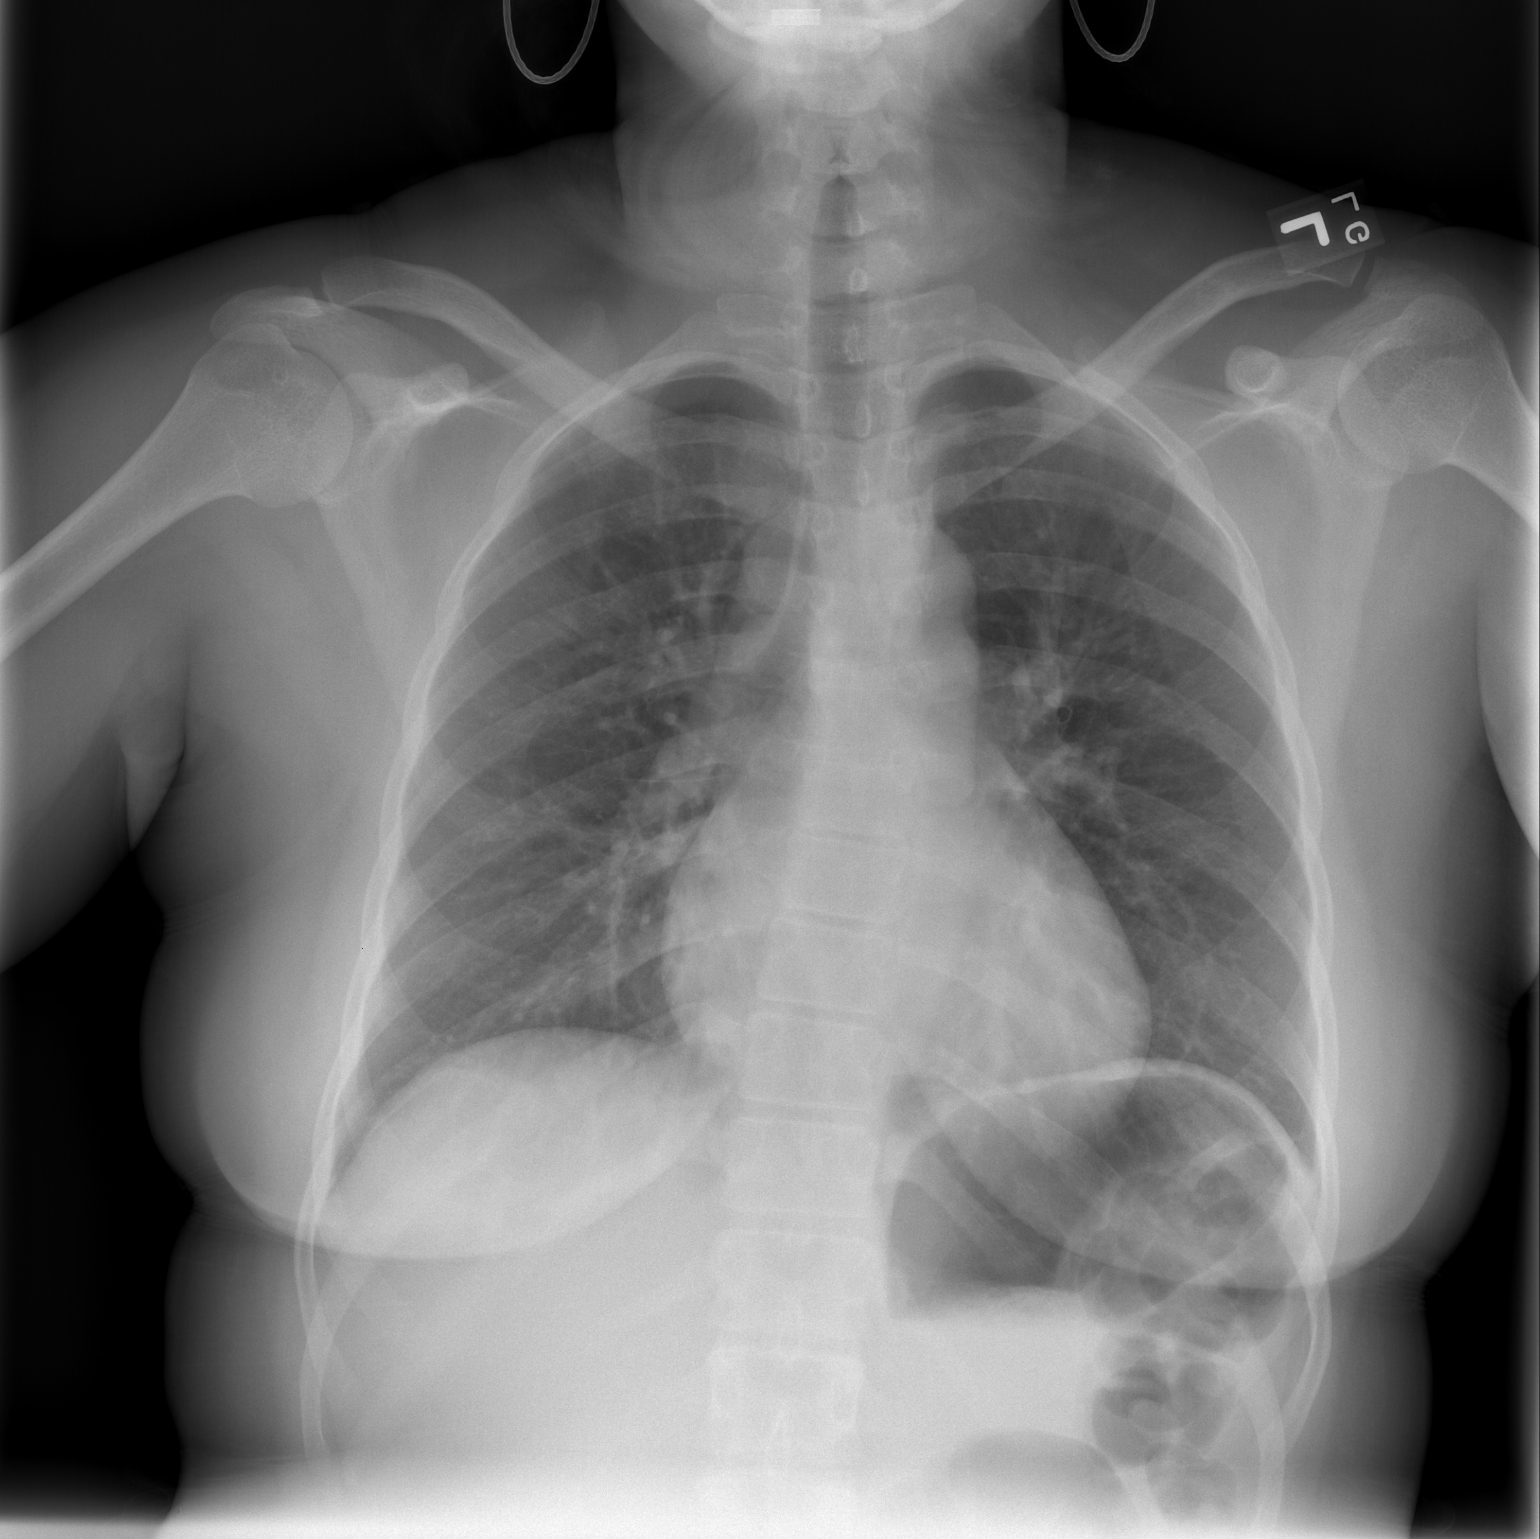

[2 of 2 positions shown; findings below may reference images not displayed]

FINDINGS: The heart is upper normal in size.  Bronchitic changes are present.  No pulmonary edema.  No pneumothoraces or effusions are seen.
IMPRESSION: Bronchitic changes.

## 2006-12-14 ENCOUNTER — Emergency Department (HOSPITAL_COMMUNITY): Admission: EM | Admit: 2006-12-14 | Discharge: 2006-12-14 | Payer: Self-pay | Admitting: Emergency Medicine

## 2007-11-18 IMAGING — CR DG LUMBAR SPINE COMPLETE 4+V
5 series · 5 of 5 positions shown · non-contrast
Comparison: None

CLINICAL DATA: Low back pain.  No known trauma

LUMBAR SPINE - COMPLETE 4+ VIEW

[t l-spine a.p.]
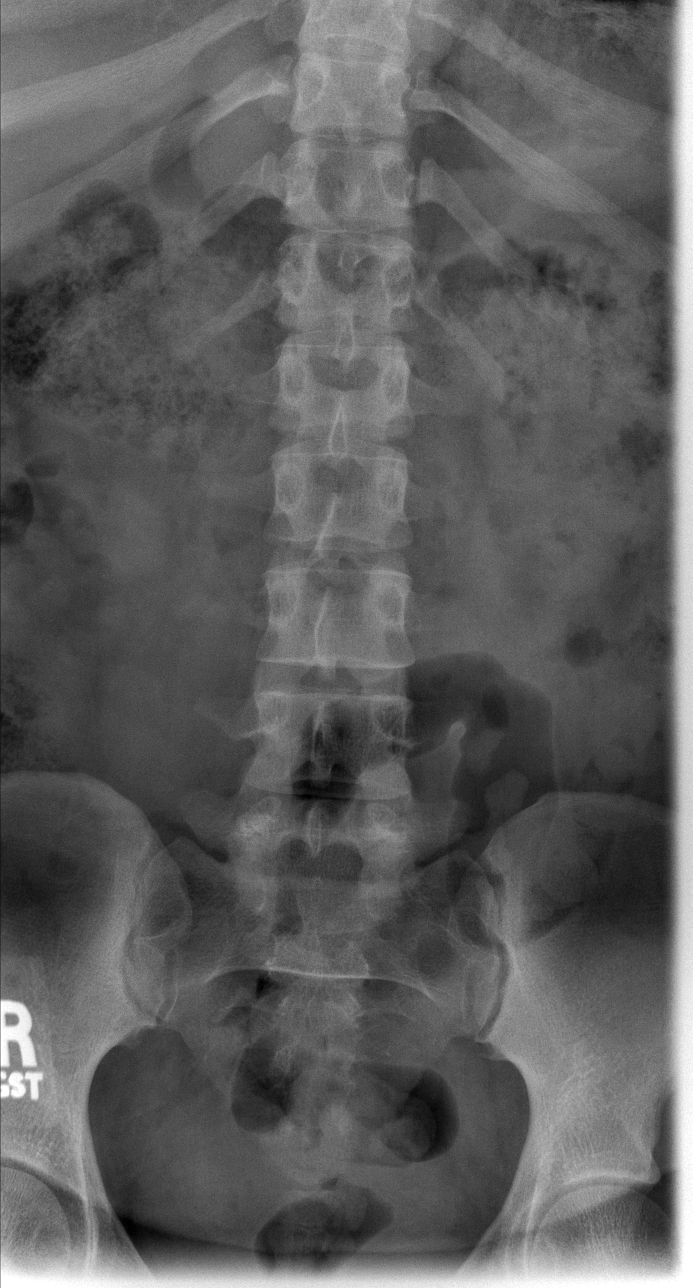

[t l-spine oblique exposure (1 of 2)]
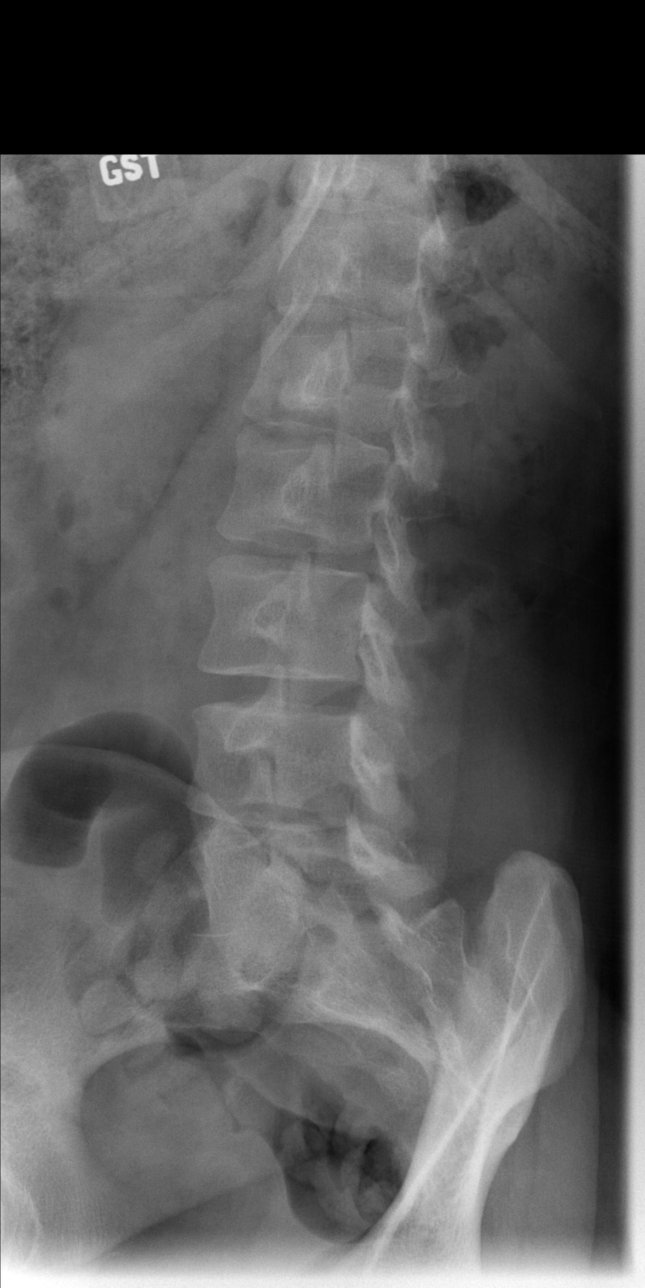

[t l-spine oblique exposure (2 of 2)]
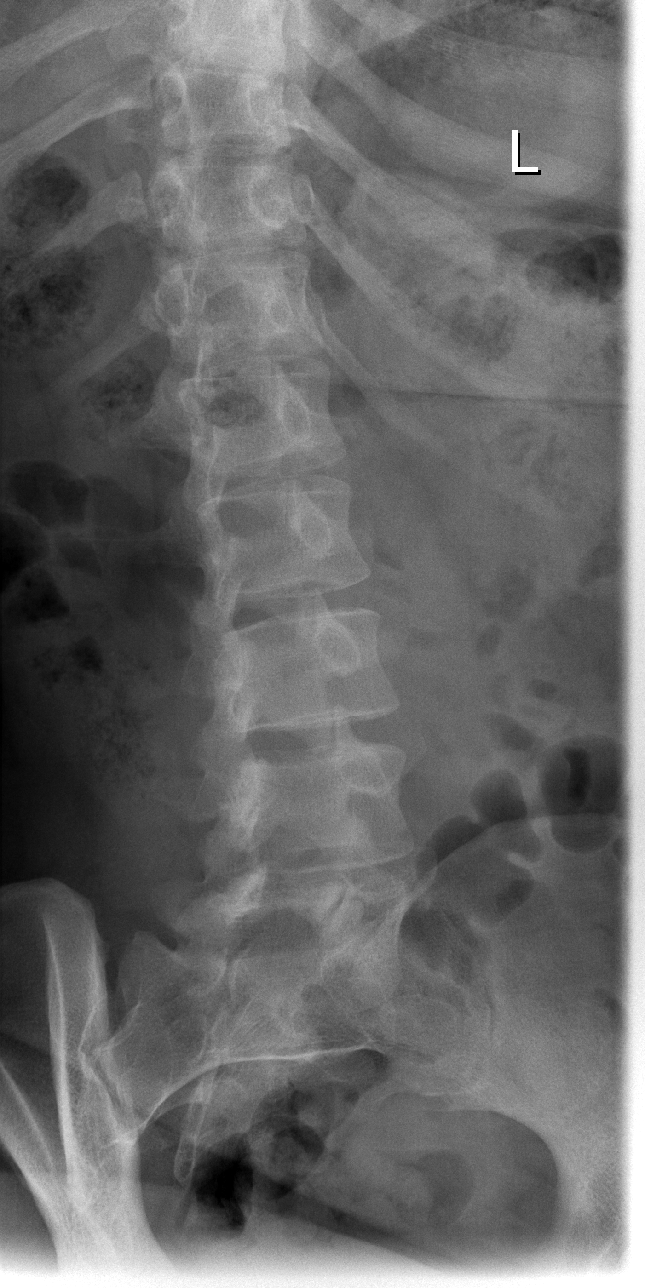

[t l-spine lat]
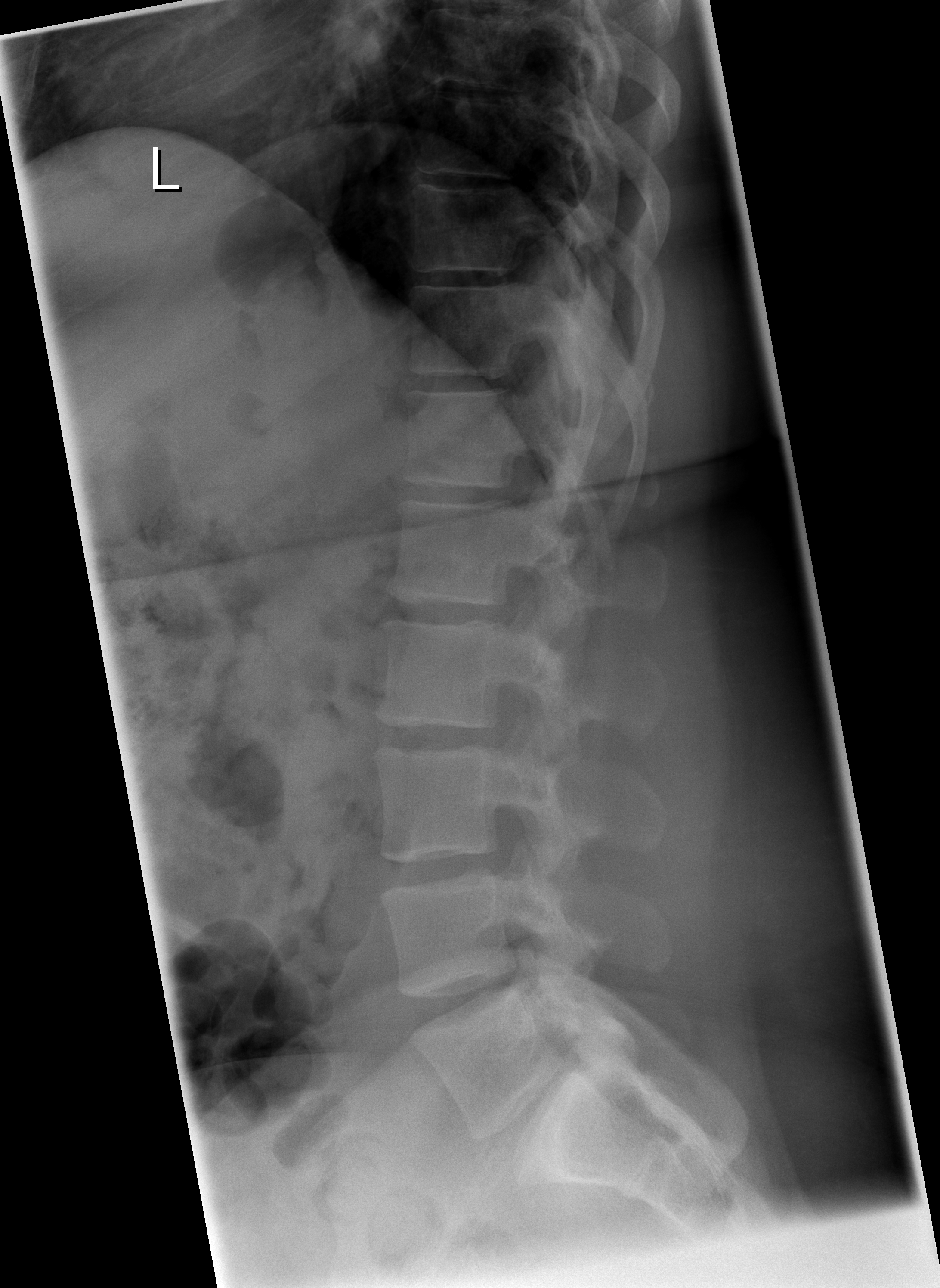

[t l-spine l5-s1 spot]
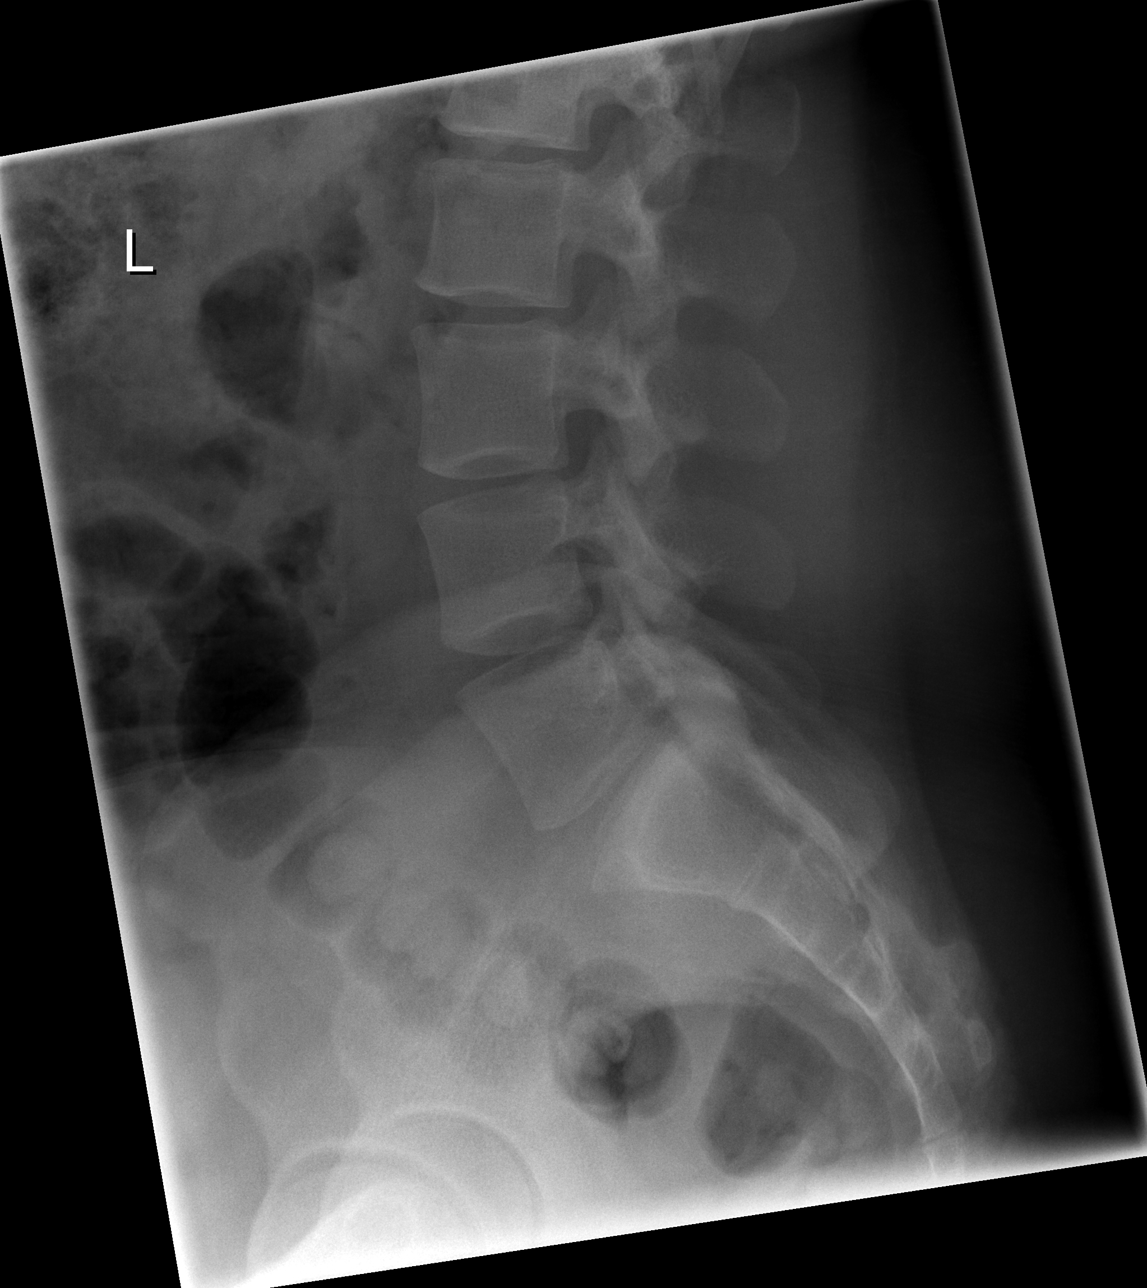

[5 of 5 positions shown; findings below may reference images not displayed]

FINDINGS: There is no evidence of lumbar spine fracture.  Alignment
is normal.  Intervertebral disc spaces are maintained.
IMPRESSION: Negative.

## 2008-04-12 ENCOUNTER — Emergency Department (HOSPITAL_COMMUNITY): Admission: EM | Admit: 2008-04-12 | Discharge: 2008-04-13 | Payer: Self-pay | Admitting: Emergency Medicine

## 2008-04-28 ENCOUNTER — Emergency Department (HOSPITAL_COMMUNITY): Admission: EM | Admit: 2008-04-28 | Discharge: 2008-04-28 | Payer: Self-pay | Admitting: Emergency Medicine

## 2008-07-12 ENCOUNTER — Emergency Department (HOSPITAL_COMMUNITY): Admission: EM | Admit: 2008-07-12 | Discharge: 2008-07-12 | Payer: Self-pay | Admitting: Emergency Medicine

## 2008-09-22 ENCOUNTER — Observation Stay (HOSPITAL_COMMUNITY): Admission: EM | Admit: 2008-09-22 | Discharge: 2008-09-22 | Payer: Self-pay | Admitting: Emergency Medicine

## 2008-09-22 IMAGING — CT CT ABDOMEN W/ CM
2 of 4 series · 17 of 46 positions shown, 19 images · IV contrast (water & 100 ML OMNI 300)
Comparison: None

CT ABDOMEN

CLINICAL DATA: Right-sided abdominal pain and nausea.

CT ABDOMEN AND PELVIS WITH CONTRAST
TECHNIQUE: Multidetector CT imaging of the abdomen and pelvis was
performed using the standard protocol following bolus
administration of intravenous contrast.
Contrast: 100 ml of [36]

[Series 2: routine abdomen · axial · 0.88mm/px · z∈[-457,-82]mm · 14 of 83 slices shown, 16 images]
[im 4/83  soft-tissue]
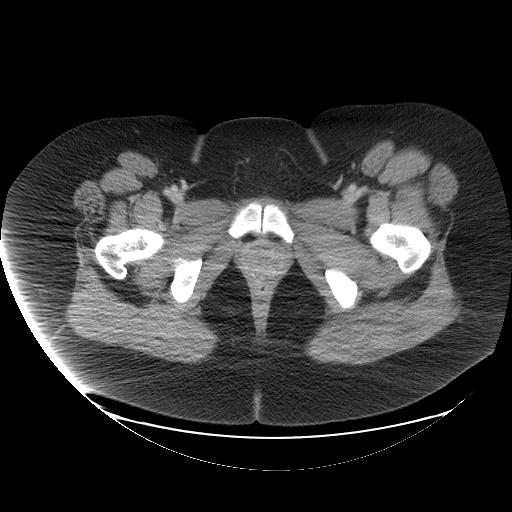
[im 4/83  bone]
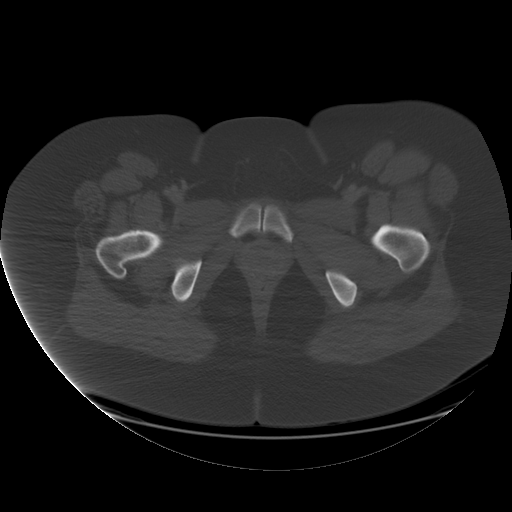
[im 11/83  soft-tissue]
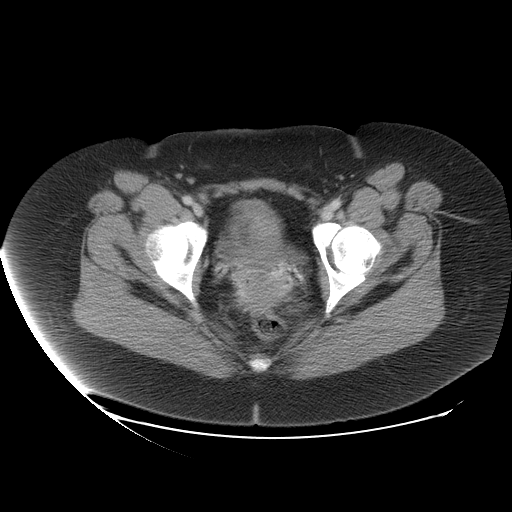
[im 15/83  soft-tissue]
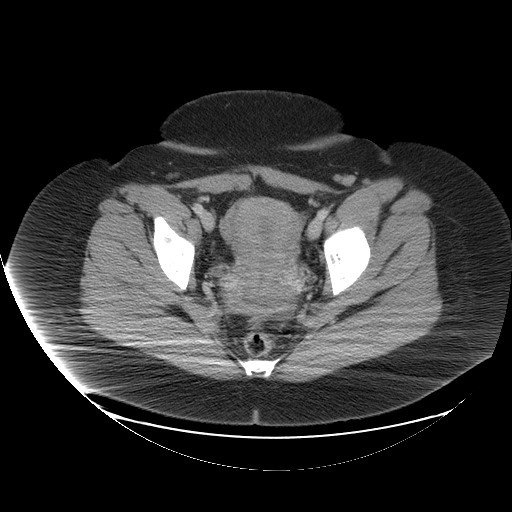
[im 22/83  soft-tissue]
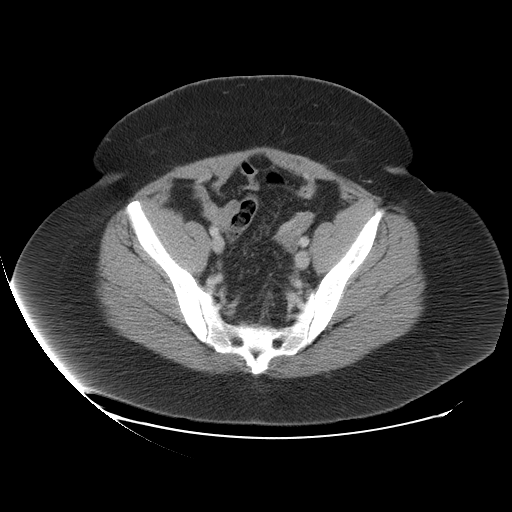
[im 29/83  soft-tissue]
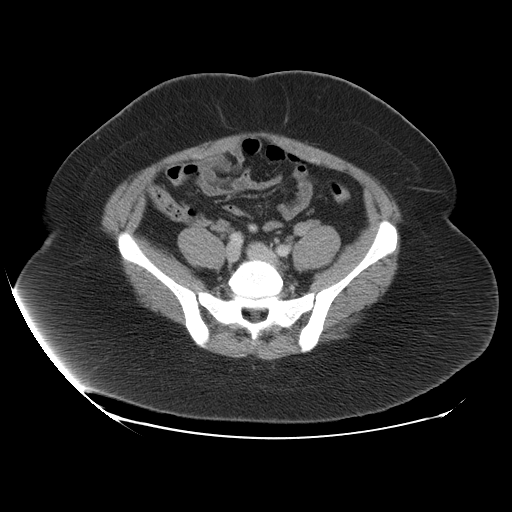
[im 33/83  soft-tissue]
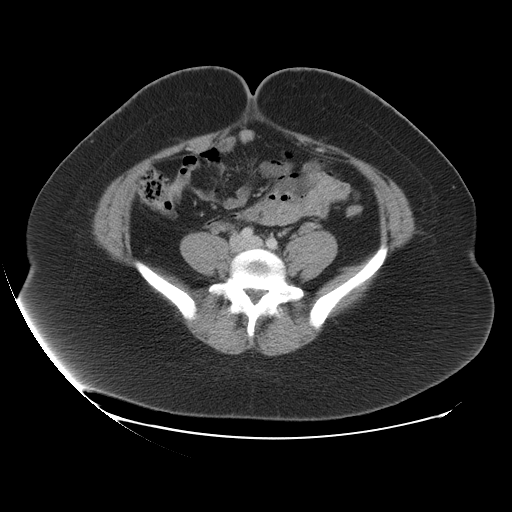
[im 40/83  soft-tissue]
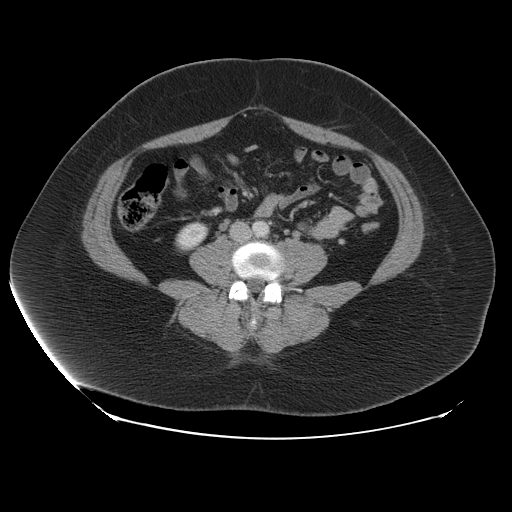
[im 43/83  soft-tissue]
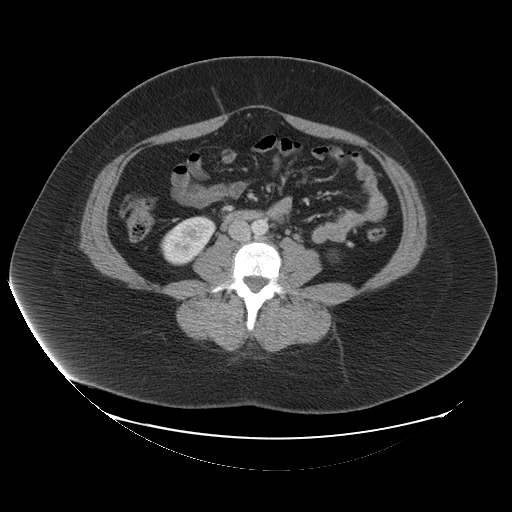
[im 50/83  soft-tissue]
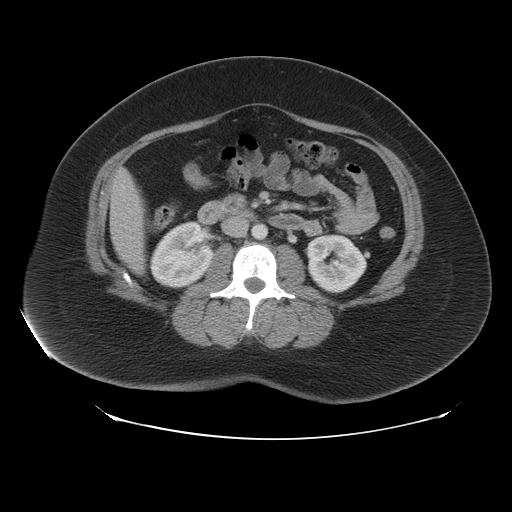
[im 50/83  bone]
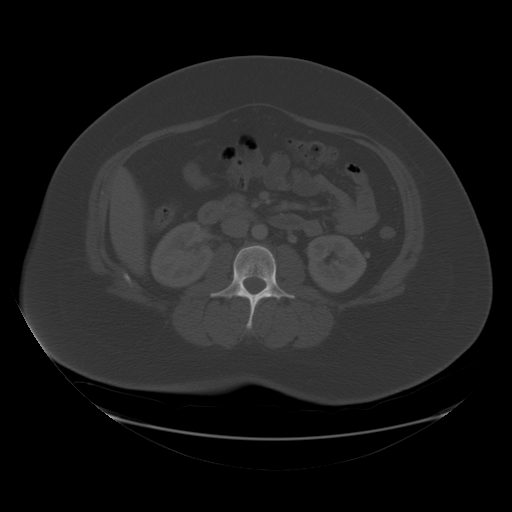
[im 54/83  soft-tissue]
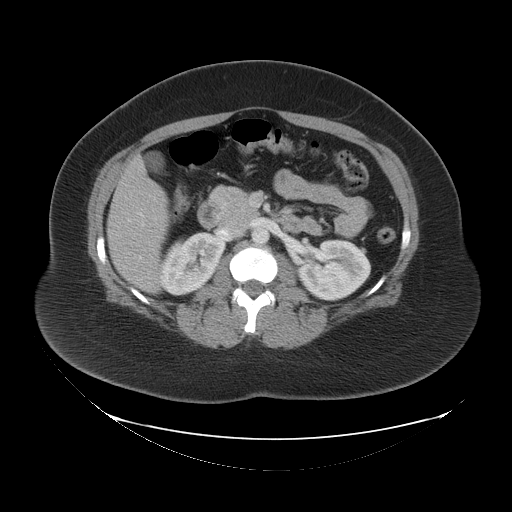
[im 61/83  soft-tissue]
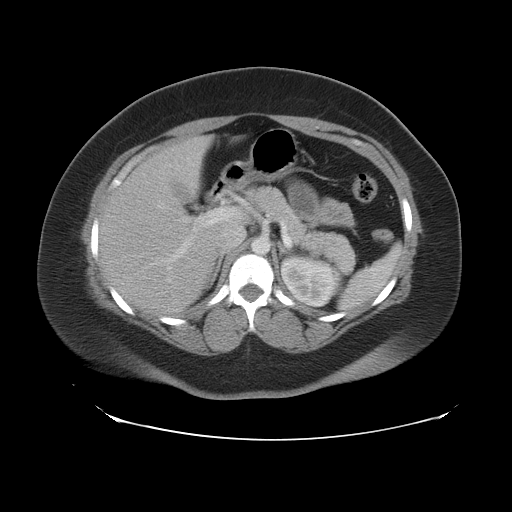
[im 68/83  soft-tissue]
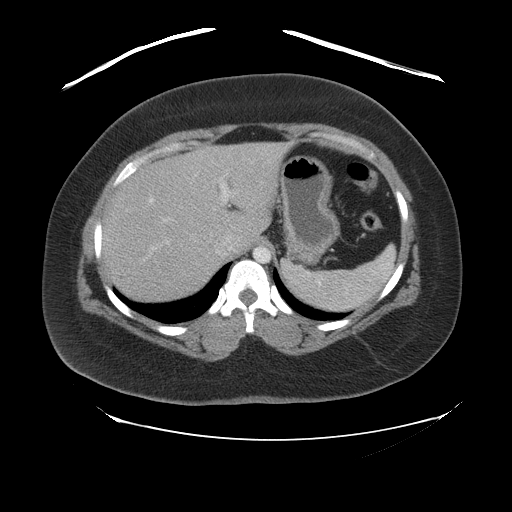
[im 72/83  soft-tissue]
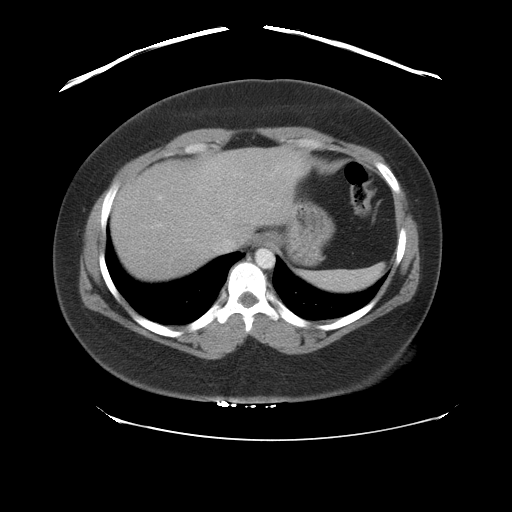
[im 79/83  soft-tissue]
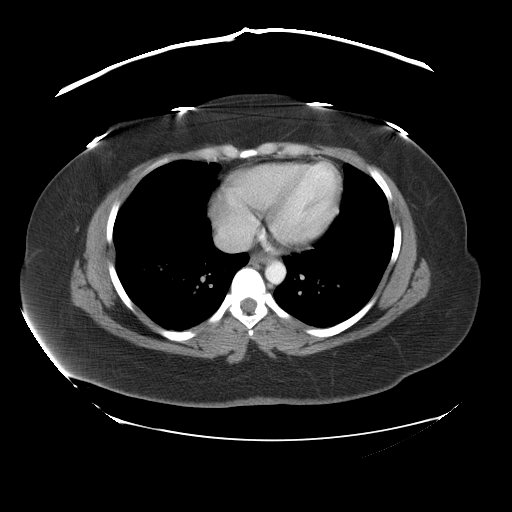

[Series 401: cor · coronal · 0.88mm/px · 3 of 87 slices shown]
[im 29/87  soft-tissue]
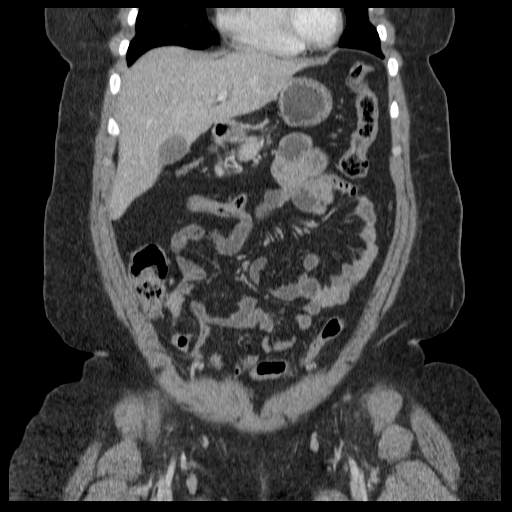
[im 39/87  soft-tissue]
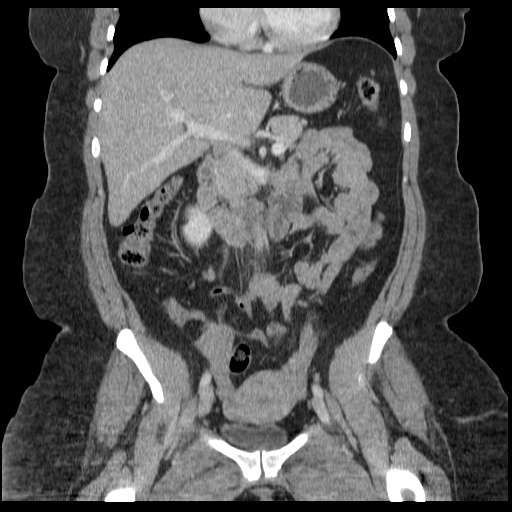
[im 48/87  soft-tissue]
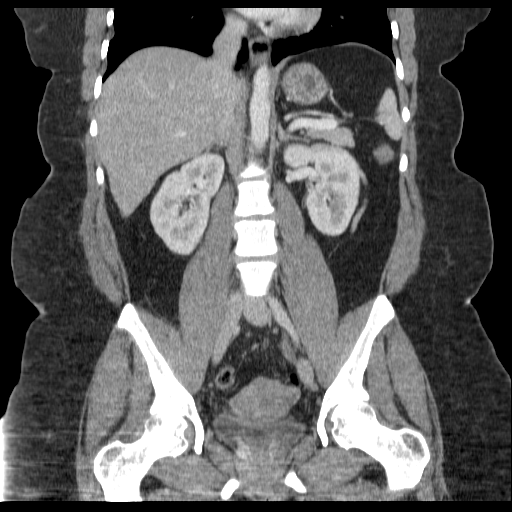

[17 of 46 positions shown; findings below may reference images not displayed]

FINDINGS: The lung bases are clear except for minimal dependent
atelectasis.

The liver, spleen, pancreas, adrenal glands and kidneys are
unremarkable.  The gallbladder appears normal.  The stomach,
duodenum, small bowel and colon are unremarkable.  No mesenteric or
retroperitoneal masses or adenopathy.  The aorta is normal in
caliber.  The major branch vessels are normal.  The portal and
splenic veins patent.

No significant bony findings.
IMPRESSION: Unremarkable CT examination of the abdomen.  No acute abdominal
findings, mass lesions or adenopathy.

CT PELVIS
FINDINGS: The rectum, sigmoid colon and visualized small bowel
loops are unremarkable.  The appendix is normal.  A tiny
appendicolith is noted.  The uterus and ovaries are unremarkable.
There is a 18 mm cyst associated the left ovary.  No pelvic mass,
adenopathy or free pelvic fluid collection.  No inguinal mass or
hernia.  The bony pelvis is intact.
IMPRESSION: 1.  No acute pelvic findings, mass lesions or adenopathy.
2.  Normal appendix except for tiny appendicolith.
3.  18 mm cyst associated with the left ovary.

## 2008-12-10 ENCOUNTER — Emergency Department (HOSPITAL_COMMUNITY): Admission: EM | Admit: 2008-12-10 | Discharge: 2008-12-10 | Payer: Self-pay | Admitting: Emergency Medicine

## 2008-12-10 IMAGING — CR DG CHEST 2V
2 series · 2 of 2 positions shown · non-contrast
Comparison: [DATE]

CLINICAL DATA: Upper chest pain, shortness of breath, cough

CHEST - 2 VIEW

[w chest pa]
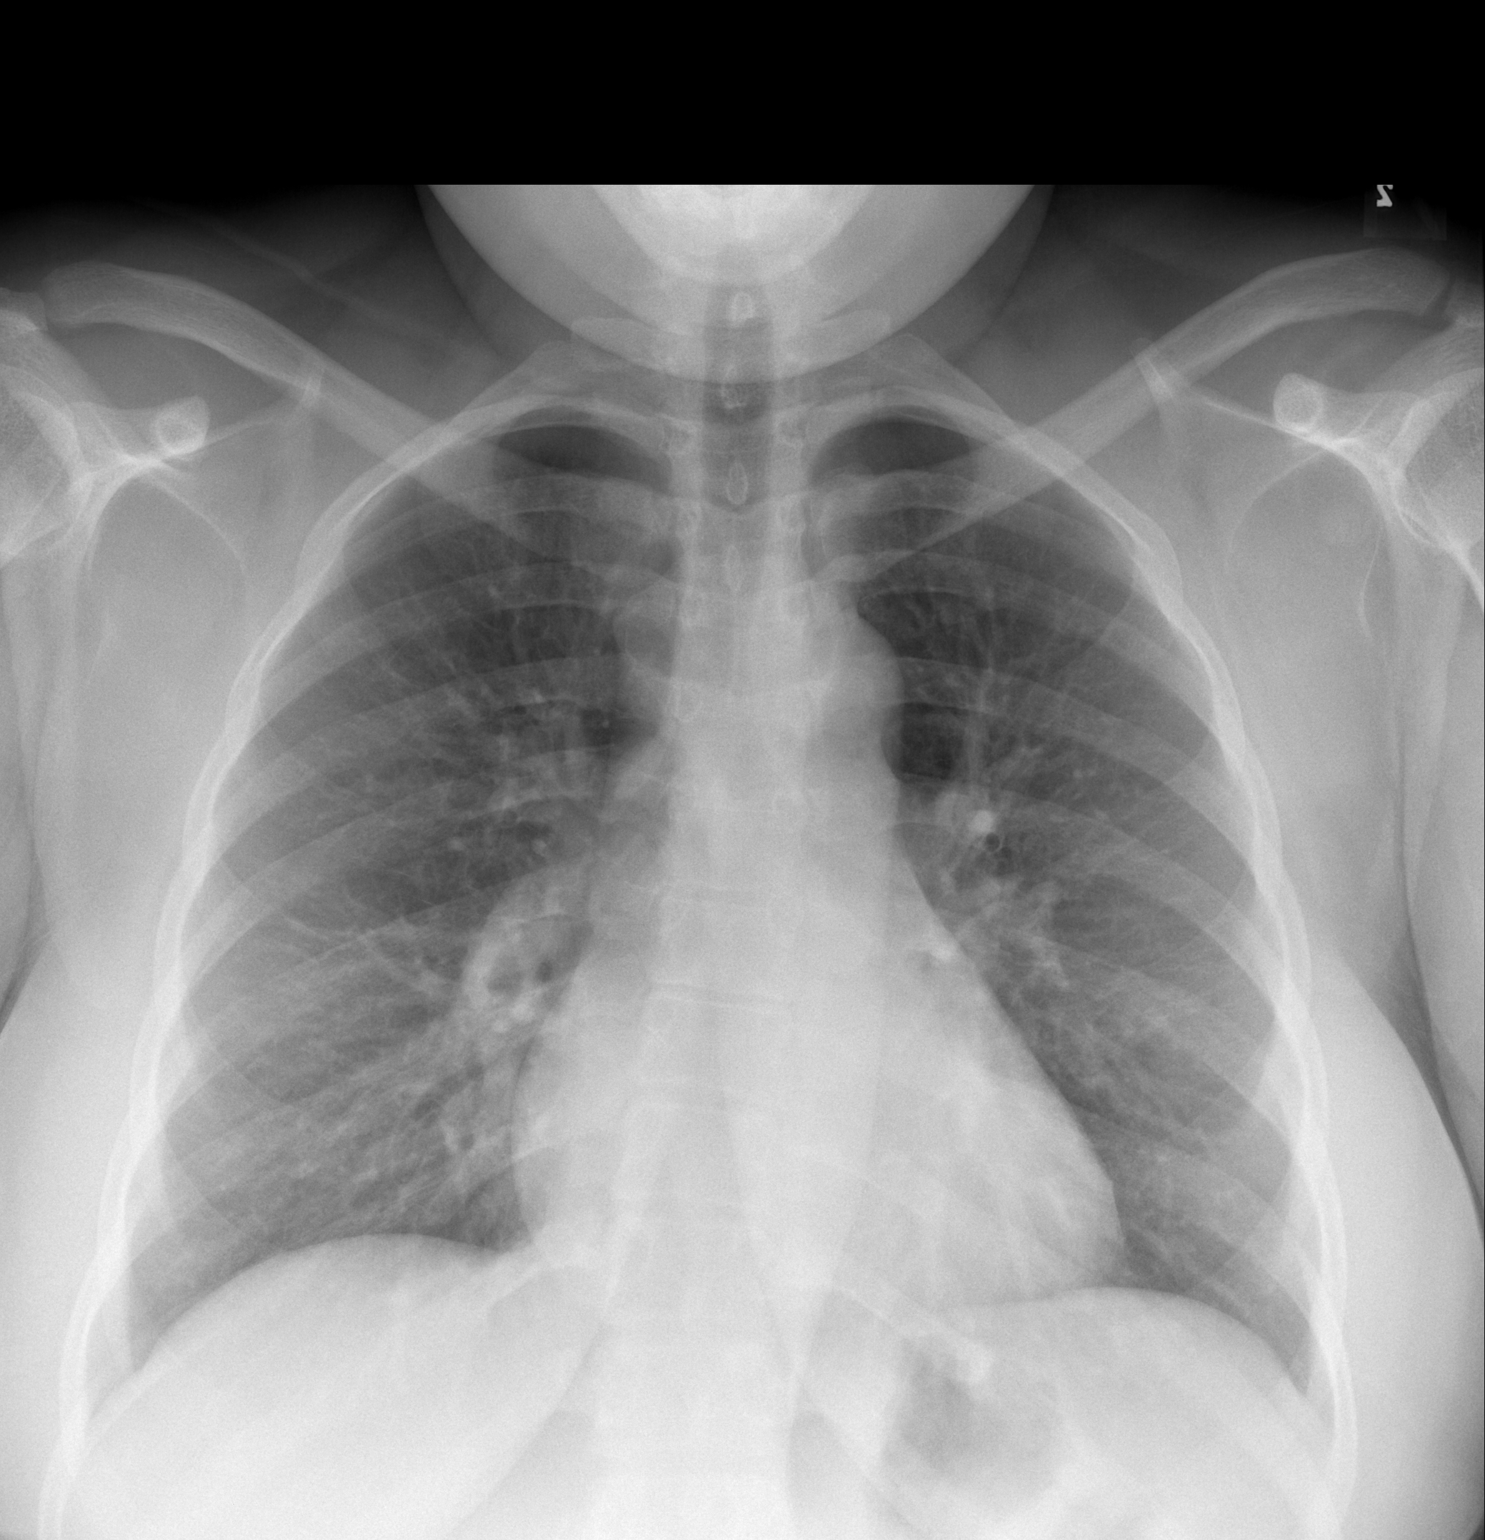

[w chest lat]
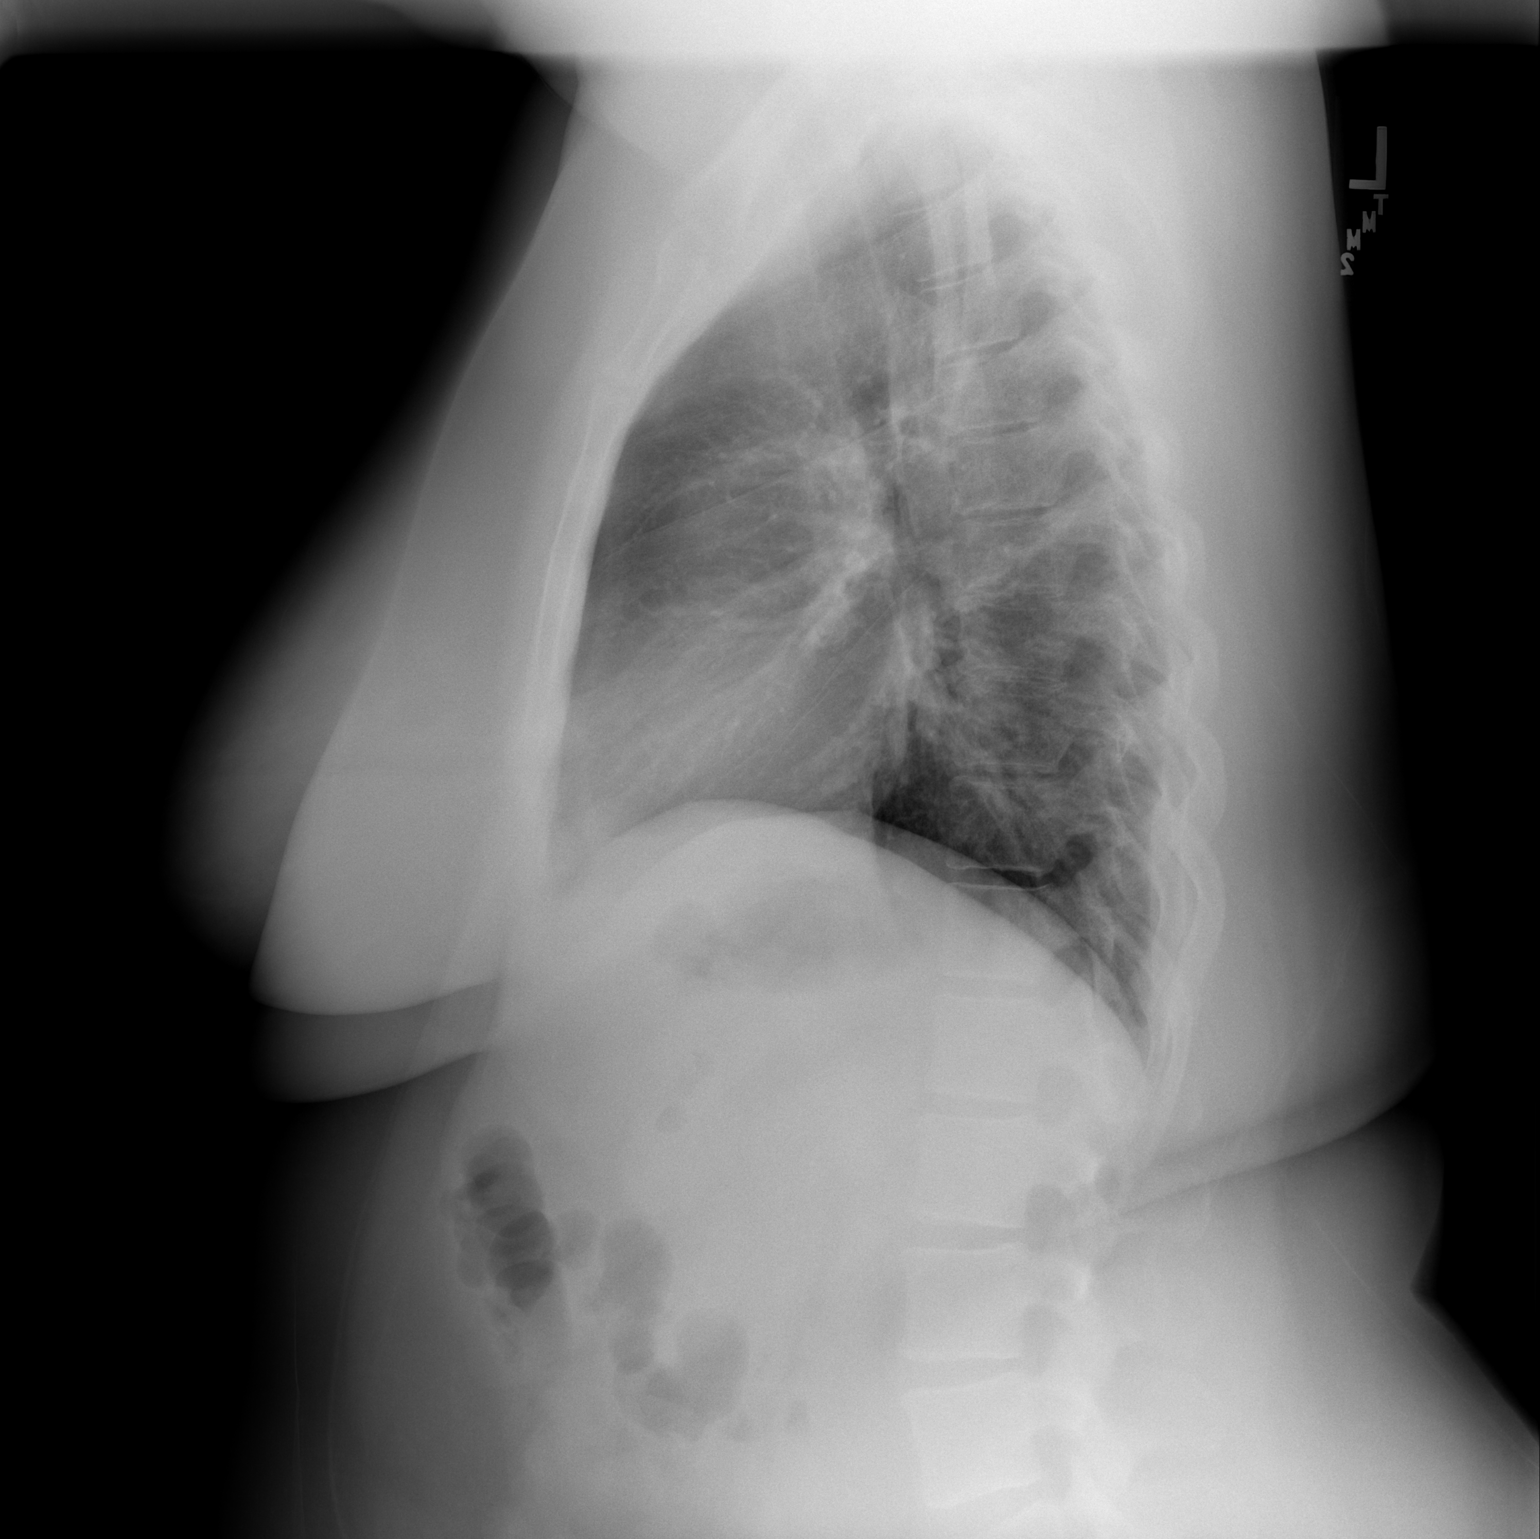

[2 of 2 positions shown; findings below may reference images not displayed]

FINDINGS: Borderline cardiac enlargement.
Normal mediastinal contours and pulmonary vascularity.
Minimal chronic bronchitic changes without infiltrate or effusion.
Bones unremarkable.
No pneumothorax.
IMPRESSION: Minimal chronic bronchitic changes.
No acute abnormalities.

## 2009-10-18 ENCOUNTER — Encounter: Admission: RE | Admit: 2009-10-18 | Discharge: 2009-12-04 | Payer: Self-pay | Admitting: Internal Medicine

## 2010-04-20 LAB — WET PREP, GENITAL
Clue Cells Wet Prep HPF POC: NONE SEEN
Trich, Wet Prep: NONE SEEN

## 2010-04-20 LAB — COMPREHENSIVE METABOLIC PANEL
ALT: 19 U/L (ref 0–35)
AST: 25 U/L (ref 0–37)
Albumin: 4 g/dL (ref 3.5–5.2)
Alkaline Phosphatase: 66 U/L (ref 39–117)
BUN: 10 mg/dL (ref 6–23)
CO2: 22 mEq/L (ref 19–32)
Calcium: 9.4 mg/dL (ref 8.4–10.5)
Chloride: 105 mEq/L (ref 96–112)
Creatinine, Ser: 0.72 mg/dL (ref 0.4–1.2)
GFR calc Af Amer: >60 mL/min (ref >60)
GFR calc non Af Amer: >60 mL/min (ref >60)
Glucose, Bld: 95 mg/dL (ref 70–99)
Potassium: 4.2 mEq/L (ref 3.5–5.1)
Sodium: 138 mEq/L (ref 135–145)
Total Bilirubin: 0.5 mg/dL (ref 0.3–1.2)
Total Protein: 7.7 g/dL (ref 6.0–8.3)

## 2010-04-20 LAB — CBC
HCT: 29.2 % — ABNORMAL LOW (ref 36.0–46.0)
Hemoglobin: 9.2 g/dL — ABNORMAL LOW (ref 12.0–15.0)
MCHC: 31.6 g/dL (ref 30.0–36.0)
MCV: 70.9 fL — ABNORMAL LOW (ref 78.0–100.0)
Platelets: 359 10*3/uL (ref 150–400)
RBC: 4.13 MIL/uL (ref 3.87–5.11)
RDW: 19 % — ABNORMAL HIGH (ref 11.5–15.5)
WBC: 12.3 10*3/uL — ABNORMAL HIGH (ref 4.0–10.5)

## 2010-04-20 LAB — URINALYSIS, ROUTINE W REFLEX MICROSCOPIC
Bilirubin Urine: NEGATIVE
Glucose, UA: NEGATIVE mg/dL
Hgb urine dipstick: NEGATIVE
Ketones, ur: 15 mg/dL — AB
Nitrite: NEGATIVE
Protein, ur: NEGATIVE mg/dL
Specific Gravity, Urine: 1.03 (ref 1.005–1.030)
Urobilinogen, UA: 0.2 mg/dL (ref 0.0–1.0)
pH: 6 (ref 5.0–8.0)

## 2010-04-20 LAB — DIFFERENTIAL
Basophils Absolute: 0 10*3/uL (ref 0.0–0.1)
Basophils Relative: 0 % (ref 0–1)
Eosinophils Absolute: 0.1 10*3/uL (ref 0.0–0.7)
Eosinophils Relative: 0 % (ref 0–5)
Lymphocytes Relative: 17 % (ref 12–46)
Lymphs Abs: 2.1 10*3/uL (ref 0.7–4.0)
Monocytes Absolute: 0.7 10*3/uL (ref 0.1–1.0)
Monocytes Relative: 6 % (ref 3–12)
Neutro Abs: 9.4 10*3/uL — ABNORMAL HIGH (ref 1.7–7.7)
Neutrophils Relative %: 77 % (ref 43–77)

## 2010-04-20 LAB — GC/CHLAMYDIA PROBE AMP, GENITAL
Chlamydia, DNA Probe: NEGATIVE
GC Probe Amp, Genital: NEGATIVE

## 2010-04-20 LAB — PREGNANCY, URINE: Preg Test, Ur: NEGATIVE

## 2010-04-20 LAB — LIPASE, BLOOD: Lipase: 31 U/L (ref 11–59)

## 2010-04-23 LAB — URINE MICROSCOPIC-ADD ON

## 2010-04-23 LAB — URINALYSIS, ROUTINE W REFLEX MICROSCOPIC
Bilirubin Urine: NEGATIVE
Glucose, UA: NEGATIVE mg/dL
Ketones, ur: NEGATIVE mg/dL
Leukocytes, UA: NEGATIVE
Nitrite: NEGATIVE
Protein, ur: NEGATIVE mg/dL
Specific Gravity, Urine: 1.025 (ref 1.005–1.030)
Urobilinogen, UA: 0.2 mg/dL (ref 0.0–1.0)
pH: 6 (ref 5.0–8.0)

## 2010-04-23 LAB — POCT PREGNANCY, URINE: Preg Test, Ur: NEGATIVE

## 2010-04-26 LAB — URINALYSIS, ROUTINE W REFLEX MICROSCOPIC
Bilirubin Urine: NEGATIVE
Glucose, UA: NEGATIVE mg/dL
Ketones, ur: NEGATIVE mg/dL
Nitrite: NEGATIVE
Protein, ur: NEGATIVE mg/dL
Specific Gravity, Urine: 1.012 (ref 1.005–1.030)
Urobilinogen, UA: 1 mg/dL (ref 0.0–1.0)
pH: 6.5 (ref 5.0–8.0)

## 2010-04-26 LAB — URINE MICROSCOPIC-ADD ON

## 2010-04-26 LAB — PREGNANCY, URINE: Preg Test, Ur: NEGATIVE

## 2010-06-01 NOTE — Discharge Summary (Signed)
[name], Caroline Carlson NO.:  [id number]   MEDICAL RECORD NO.:  [id number]                   PATIENT TYPE:  INP   LOCATION:  9109                                 FACILITY:  [facility]   PHYSICIAN:  Tanya S. Shawnie Pons, M.D.                DATE OF BIRTH:  [birth year]   DATE OF ADMISSION:  06/19/2002  DATE OF DISCHARGE:                                 DISCHARGE SUMMARY   DISCHARGE DIAGNOSES:  1. Preterm delivery.  2. Postoperative day number three status post repeat low transverse cesarean     section.  3. Chorioamnionitis.  4. Anemia.   DISCHARGE MEDICATIONS:  1. Percocet 5/325 mg one tablet p.o. q.4h. p.r.n. pain.  2. Prenatal vitamins one tablet p.o. daily.  3. Iron sulfate 325 mg one tablet p.o. b.i.d.   DISCHARGE LABORATORIES:  Hematocrit 23, hemoglobin 7.1 (obtained on June 20, 2002).  Placental pathology pending.  Urine culture no growth to date.   HOSPITAL COURSE:  The patient is a 30 year old that presented for fevers and  abdominal pain on June 19, 2002 to the maternal admissions unit.  She was 34-  1/7 weeks IUP having fevers, chills with fevers up to 103 degrees and  diffuse abdominal tenderness.  While in the MAU patient was ruled out for  ruptured membranes.  Fetal heart tracings were reassuring with a baseline  fetal heart rate of 170.  The patient was started on empiric antibiotics  with ampicillin and gentamicin and taken to the OR for repeat transverse  cesarean section by Shelbie Proctor. Shawnie Pons, M.D.  Complications of her pregnancy  included late prenatal care, history of IUFD at 5-6 months, anemia, history  of GBS with pregnancy number four, treated.  After her cesarean section she  is now G5, P1-2-2-2 currently postoperative day number three status post a  repeat low transverse cesarean section at 34 and 1 weeks IUP secondary to  chorioamnionitis.  Cesarean section was performed without complications.  Apgars were 8/1 and 9/5.  Placenta was easily  separated and sent to  pathology.  The infant was handed to the NICU team.  Antibiotics were  changed to Cefotetan and patient defervesced postpartum.  She has been  afebrile x48 hours prior to discharge date.  Her postpartum hemoglobin was  found to be 7.1 on June 6 and patient was started on iron sulfate 325 mg  p.o. b.i.d.  She was breast-feeding without complications.  She delivered a  preterm female currently in the newborn nursery under [facility].  For  contraception she will have Depo-Provera prior to discharge home.  Notable  other laboratories on her OB panel include that she is B+, antibody  negative, and rubella immune.   DISCHARGE INSTRUCTIONS:  She is instructed to do no heavy lifting, nothing  in the vagina for six weeks.  Her postpartum check will be at [facility]  in six weeks.  She has been given prescriptions for Percocet prenatal  vitamins with iron supplementation.  She is to return to maternal admission  on June 24, 2002 for staple removal.    Lorne Skeens, D.O.                         Shelbie Proctor. Shawnie Pons, M.D.   Erick Alley  D:  06/22/2002  T:  06/22/2002  Job:  [id number]   cc:   [facility]

## 2010-06-01 NOTE — Op Note (Signed)
Caroline Carlson, Caroline Carlson NO.:  [id number]   MEDICAL RECORD NO.:  [id number]                   PATIENT TYPE:  INP   LOCATION:  9109                                 FACILITY:  [facility]   PHYSICIAN:  Tanya S. Shawnie Pons, M.D.                DATE OF BIRTH:  Jun 02, 1980   DATE OF PROCEDURE:  06/19/2002  DATE OF DISCHARGE:                                 OPERATIVE REPORT   PREOPERATIVE DIAGNOSES:  1. Intrauterine pregnancy at  34+ weeks.  2. Chorioamnionitis.  3. Previous cesarean section.   POSTOPERATIVE DIAGNOSES:  1. Intrauterine pregnancy at  34+ weeks.  2. Chorioamnionitis.  3. Previous cesarean section.   PROCEDURE:  Repeat low transverse cesarean section.   SURGEON:  Shelbie Proctor. Shawnie Pons, M.D.   ANESTHESIA:  Spinal, Burnett Corrente, M.D.   FINDINGS:  A viable female infant, weight 5 pounds 5 ounces.  Apgars 8 and  9.  A very thin lower uterine segment.  Normal tubes and ovaries.   ESTIMATED BLOOD LOSS:  750 mL.   COMPLICATIONS:  None.   SPECIMENS:  Placenta to pathology.   REASON FOR PROCEDURE:  Briefly, he patient is a 30 year old gravida 2, para  1-1-2-1, who had a previous C-section for arrest of dilation in her last  pregnancy, who reported onset today of significant lower abdominal cramping-  type pain consistent with contractions, lower back pain, and fever and  chills.  When she arrived at the maternity admissions unit, her temperature  was 103.0, and she appeared to be contracting.  Rupture of membranes was not  confirmed; however, she was definitely contracting on the monitor, was  having to breathe through them, was 1-2, 75%, and high on vaginal exam.  Her  uterus was exquisitely tender.  Given her likely labor secondary to  chorioamnionitis, although rupture of membranes was not confirmed, it was  felt the patient would be best served with delivery.  The patient requested  a repeat C-section for delivery.  The NICU team was called, and the  patient  was prepared for surgery.   DESCRIPTION OF PROCEDURE:  The patient was taken to the operating room,  where she was placed in supine position with a left lateral tilt after  spinal analgesia was obtained.  She was then prepped and draped in the usual  sterile fashion and anesthesia was noted to be adequate using an Allis test.  The knife was used to make a Pfannenstiel incision in the skin, and this was  taken down to the underlying fascia through numerous bits of scar tissue.  The fascia was then opened laterally with a Mayo scissors and the fascia  grasped in the midline on the superior edge with Kocher clamps and elevated  off the underlying rectus muscles, which were dissected off the fascia  bluntly laterally and sharply in the midline.  Similarly, the inferior edge  of the fascia  was grasped with Kocher clamps and dissected off the  underlying rectus bluntly laterally and sharply in the midline.  An adhesion  was taken down in the midline of the rectus muscles and the peritoneal  cavity entered bluntly above the bladder.  The bladder blade was then placed  in.  A pair of Metzenbaum scissors was used to create a bladder flap on the  uterus.  Once the bladder flap was made, a low transverse incision was made  in the uterus in a concave fashion with one pass of the knife.  The amniotic  fluid cavity was bulging through the incision and the incision was then able  to be opened bluntly.  A pair of pickups was used to break what was clear  fluid, artificial rupture of membranes and found clear fluid.  The infant  was then delivered atraumatically in an OA position.  A nuchal cord was  found, the baby delivered through this.  The cord was clamped x2 and cut.  The infant was bulb-suctioned and had a spontaneous cry.  It was taken to  the waiting NICU team.  Apgars were 8 and 9.  Weight was 5 pounds 5 ounces.  The cord blood was obtained.  The placenta was then delivered.  Remaining   membranes were evacuated from the uterus via lap pad.  The edges of the  uterine incision were grasped with ring forceps and the uterine incision was  closed with a locked running #1 Vicryl suture.  A second suture of #1 Vicryl  was used as an imbricating layer in a __________ fashion.  Pressure was held  on the incision and the gutters were irrigated and cleared of all clots.  At  that time the tubes and ovaries were examined and found to be normal.  After  irrigation, the Pfannenstiel incision was again looked at and found to be  hemostatic.  All instruments were then removed from the abdominal cavity.  The rectus was examined and found to be hemostatic, and the fascia was  closed with #1 Vicryl suture in a running fashion, the subcutaneous tissue  irrigated, any bleeders cauterized with the electrocautery, and the skin  closed using staples.  All instrument, needle, and lap counts were correct  x2.  The patient was awakened and taken to the recovery room in stable  condition.                                                Shelbie Proctor. Shawnie Pons, M.D.    TSP/MEDQ  D:  06/19/2002  T:  06/20/2002  Job:  [id number]

## 2010-06-01 NOTE — Discharge Summary (Signed)
   NAMEROWENA, MOILANEN NO.:  [id number]   MEDICAL RECORD NO.:  [id number]                   PATIENT TYPE:  INP   LOCATION:  9109                                 FACILITY:  [facility]   PHYSICIAN:  Lorne Skeens, D.O.                   DATE OF BIRTH:  1980/02/10   DATE OF ADMISSION:  06/19/2002  DATE OF DISCHARGE:  06/23/2002                                 DISCHARGE SUMMARY   ADDENDUM TO DICTATION 914782:   ATTENDING OF RECORD:  Tanya S. Shawnie Pons, M.D.   The patient stayed an additional hospital day secondary to baby being  transferred to the NICU in the evening.  No additional complaints.  The  patient is tearful on exam, and anxious about her baby's status in the NICU.  Concern for postpartum depression, although patient denies any history of  depression prior to pregnancy.  Incision was clean, dry, and intact.  Staples were intact.  Staples were removed today on exam.  Steri-strips were  placed.   The patient was given discharge instructions.  She has already received her  Depo-Provera shot.  She continues to pump breasts for breast milk.  She will  continue on iron sulfate for her anemia, and she is instructed to follow up  in the NAU if postpartum depression becomes worse.  She has been scheduled  for a follow up appointment in six weeks to be seen at [facility].                                               Lorne Skeens, D.O.    KL/MEDQ  D:  06/23/2002  T:  06/23/2002  Job:  [id number]

## 2010-06-01 NOTE — Discharge Summary (Signed)
Caroline Carlson, Caroline Carlson            ACCOUNT NO.:  [id number]   MEDICAL RECORD NO.:  [id number]          PATIENT TYPE:  INP   LOCATION:  9113                          [facility]ACILITY:  [facility]   PHYSICIAN:  Charles A. Clearance Coots, M.D.DATE OF BIRTH:  [birth year]   DATE OF ADMISSION:  05/26/2004  DATE OF DISCHARGE:  05/29/2004                                 DISCHARGE SUMMARY   ADMISSION DIAGNOSES:  1.  Previous cesarean section x2.  2.  Desires repeat cesarean section.  3.  Desires permanent sterilization.   DISCHARGE DIAGNOSES:  1.  Previous cesarean section x2.  2.  Desires repeat cesarean section.  3.  Desires permanent sterilization.  4.  Status post repeat low transverse cesarean section and bilateral tubal      ligation on May 26, 2004 with delivery of a viable female at 78 with      Apgar's of 8 at 1 minute, 9 at 5 minutes, weight of 3030 g, length of 48      3/4 cm. Mother and infant discharged home in good condition.   REASON FOR ADMISSION:  A 29 year old black female, G6, P1-2-0-2, estimated  date of confinement of Jun 10, 2004 presents with uterine contractions at 37  and 6/[redacted] weeks gestation. The patient had a history of two previous cesarean  sections and had desired repeat cesarean section. She also desired permanent  sterilization. The patient was taken to the operating room for repeat  cesarean section and bilateral tubal ligation.   PAST MEDICAL HISTORY:  1.  Surgery:  Cesarean section x2,  2.  Illnesses:  Hypertension chronic, no medications.  History of cystitis.   MEDICATIONS:  Prenatal vitamins, iron.   ALLERGIES:  No known drug allergies.   SOCIAL HISTORY:  Single, negative tobacco, alcohol or recreational drug use.   PHYSICAL EXAMINATION:  GENERAL:  Well-nourished, well-developed female in no  acute distress.  VITAL SIGNS:  Afebrile, vital signs stable.  LUNGS:  Clear to auscultation bilaterally.  HEART:  Regular rate and rhythm.  ABDOMEN:  Gravid, nontender.  Cervix long, closed, vertex at a -3.   ADMITTING LABORATORY VALUES:  Hemoglobin 9.2, hematocrit 28.3, white blood  cell count 12,600, platelets 258,000.   HOSPITAL COURSE:  The patient underwent a repeat low transverse cesarean  section and bilateral tubal ligation on May 26, 2004. There were no  intraoperative complications. Postoperative course was uncomplicated. The  patient was discharged home in good condition on postoperative day #3.   DISCHARGE LABORATORY VALUES:  Hemoglobin 8.8, hematocrit 26.6, white blood  cell count 11,000, platelets 233,000.   DISPOSITION:  Medications:  Continue prenatal vitamins. Iron was prescribed  for anemia. Tylenol and ibuprofen was prescribed for pain.   Routine written instructions per booklet were given for obstetrical  discharge after cesarean section. The patient is to call the office for a  followup appointment in two weeks.      CAH/MEDQ  D:  05/29/2004  T:  05/29/2004  Job:  [id number]

## 2010-06-01 NOTE — Op Note (Signed)
Caroline Carlson, Caroline Carlson NO.:  [id number]   MEDICAL RECORD NO.:  [id number]          PATIENT TYPE:  INP   LOCATION:  9113                          FACILITY:  [facility]   PHYSICIAN:  Roseanna Rainbow, M.D.DATE OF BIRTH:  [birth year]   DATE OF PROCEDURE:  05/26/2004  DATE OF DISCHARGE:                                 OPERATIVE REPORT   PREOPERATIVE DIAGNOSES:  1.  Intrauterine pregnancy at term with history of previous cesarean      deliveries x 2.  2.  Early latent labor.  3.  Desires a sterilization procedure.   POSTOPERATIVE DIAGNOSES:  1.  Intrauterine pregnancy at term with history of previous cesarean      deliveries x 2.  2.  Early latent labor.  3.  Desires a sterilization procedure.   PROCEDURE:  Repeat cesarean delivery and modified Pomeroy bilateral tubal  ligation.   SURGEON:  Dr. Tamela Oddi.   ANESTHESIA:  Spinal.   SPECIMENS:  Portions of fallopian tubes.   ESTIMATED BLOOD LOSS:  600 mL.   COMPLICATIONS:  None.   PROCEDURE:  The patient was taken to the operating room and spinal  anesthetic was placed without difficulty.  She was then placed in the dorsal  supine position with a leftward tilt and prepped and draped in the usual  sterile fashion. A Pfannenstiel skin incision was then made through the  previous scar with s scalpel.  This was carried down to the underlying  fascia with Bovie.  The fascia was incised in the midline.  The fascial  incision was then extended bilaterally with curved Mayo scissors.  The  inferior aspect of the fascial incision was tented up and the underlying  rectus muscles dissected off.  The inferior aspect of the fascial incision  was then manipulated in a similar fashion.  The rectus muscles were  separated in the midline.  Parietal peritoneum was entered bluntly.  The  peritoneal incision was then extended superiorly and inferiorly with good  visualization of the bladder.  The bladder blade was then placed.   The  vesicouterine peritoneum was tented up and entered sharply with Metzenbaum  scissors.  This incision was then extended bilaterally and the bladder flap  created bluntly.  The bladder blade was then re-placed.  The lower uterine  segment was then incised in a transverse fashion with scalpel.  The uterine  incision was then extended bluntly.  The infant's head was delivered  atraumatically.  The oropharynx was suctioned with the bulb suction.  The  cord was clamped and cut, and the infant was handed off to the waiting  neonatologist.  Please note the infant was noted to have a cleft lip.  The  placenta was then removed.  The intrauterine cavity was evacuated of any  remaining amniotic fluid clots and debris with a moist laparotomy sponge.  The uterine incision was then reapproximated in running interlocking fashion  using O Monocryl.  A second imbricating suture of the same was then placed.  Adequate hemostasis was noted.  The uterus was then exteriorized.  The mid  isthmic portion of  the left fallopian tube was then grasped with a Babcock  clamp.  A 2-3 cm segment of tube was doubly ligated with O plain and  excised.  Adequate hemostasis was noted.  The right fallopian tube was  manipulated in a similar fashion.  The uterus was returned to the abdomen.  The paracolic gutters were then irrigated.  The parietal peritoneum was  reapproximated in a running fashion using 2-0 Vicryl.  The fascia was  reapproximated in a running fashion with O PDS.  The skin was reapproximated  in a subcuticular fashion using 3-0 Monocryl.  At the close of the  procedure, the instrument and pack counts were said to be correct x 2.  The  patient was taken to the PACU awake and in stable condition.      LAJ/MEDQ  D:  05/26/2004  T:  05/27/2004  Job:  [id number]

## 2010-06-01 NOTE — Discharge Summary (Signed)
NAMEJERILYN, GILLASPIE NO.:  [id number]   MEDICAL RECORD NO.:  [id number]                   PATIENT TYPE:  INP   LOCATION:  9372                                 FACILITY:  [facility]   PHYSICIAN:  Dr. Kristen Loader                    DATE OF BIRTH:  13-Jan-1981   DATE OF ADMISSION:  06/24/2002  DATE OF DISCHARGE:  06/26/2002                                 DISCHARGE SUMMARY   HISTORY:  This is a 30 year old G5, P1-2-2-2, who presents on postoperative  day number 5, status post repeat LTCS at 34-1/7ths weeks for  chorioamnionitis in labor with nonreassuring fetal heart rate.   ADMISSION DIAGNOSES:  1. Possible preeclampsia.  2. Severe anemia.   DISCHARGE DIAGNOSES:  1. Possible preeclampsia, resolved.  2. Anemia, resolved.  3. SP blood transfusion postop C-section day number 7.   HISTORY:  This 30 year old G5, P1-2-2-2, presented with complaint of a  headache as well as some orthostatic symptoms and incisional pain.  Of note  she had also been seen at [facility] for incisional pain and superficial  inflammation two days before day of admission.  Her postoperative course on  06/19/2002 was essentially uncomplicated and she remained normotensive  throughout that hospitalization.  She was discharged home on hospital day #3  with staples still in and was to come back for staple removal on day 7.  On  admission, she was complaining of a headache which had been present for  several days and was not severe or unilateral.  She also had some  orthostatic symptoms and some incisional pain.  She reported an increase in  lower extremity edema, but no generalized edema.   ADMISSION PHYSICAL EXAM:  Temp 98.  Pulse 80.  Blood pressure 122/76.  HEART:  Regular rate and rhythm without murmur.  LUNGS:  Clear to auscultation bilaterally.  ABDOMEN:  Appropriate tenderness over incision.  There was also some mild  epigastric tenderness and thoracic back pain.   Her headache had  resolved on hospital day one.  She no longer had visual  symptoms.  She was placed on magnesium sulfate on day of admission.  Her  magnesium levels ranged from 4.8 to 6.6.  She continued to have an  intermittent mild headache and some blurry vision.  Of note, on June 25, 2002, her orthostatic vital signs revealed significant changes and she was  counseled on risks and benefits of transfusion and proceeded to have  transfusion of two units of packed rbc's and her hemoglobin at that point  was 6.5.  Post transfusion, hemoglobin was 9.2.  Hospital day 2, she again  had PIH labs and of note her uric acid was 5.5, LFTs were normal, LDH  normal.  Platelets remained in the 300s, and she had negative proteinuria.  On postoperative day number 7, which was hospital day number 2, her  magnesium sulfate  infusion had been discontinued.  Since then she was doing  well with good diuresis.  Her balance was negative 380 for eight hours prior  to discharge and for 24 hours was negative.  Protein 72.  Her weight had  decreased.  She was voiding well since magnesium discontinued.  Doing well  with a breast pump.  Neonate was stable.   PHYSICAL EXAM:  Afebrile.  Pulse was 80.  Blood pressures remained in the 96  to 118/50 to 60 range.  ABDOMEN:  Soft and nontender.  Fundus was firm _________ with the umbilicus.  Steri-strips were all loose and removed.  The right margin of incision had a  superficial skin separation, scant serous drainage.  No induration or  redness.  Her DTRs were 1+.  Her admission urine culture had returned as  negative.   DISPOSITION AND DISCHARGE INSTRUCTIONS:  She was discharged home with an  apron dressing and instructions to keep the wound clean and dry.  She was  counseled on breastfeeding and was continued on medications as per her  discharge on June 23, 2002.  She was also advised to keep her [facility] appointment in six weeks and return before that if she had any   problems.      Deirdre Christy Gentles, C.N.M.                       Dr. Kristen Loader    DP/MEDQ  D:  07/26/2002  T:  07/27/2002  Job:  [id number]

## 2010-06-01 NOTE — Discharge Summary (Signed)
. [facility]  Patient:    Caroline Carlson, Caroline Carlson                      MRN: [id number] Adm. Date:  [id number] Disc. Date: [id number] Attending:  Odie Sera                           Discharge Summary  IDENTIFYING INFORMATION:  Ms. Daleen Squibb is a 30 year old, single African-American female who became intoxicated and displayed disorganized behaviors and even had a level of dangerousness with this and made suicidal statements.  She was acting so bizarrely as to warrant inpatient psychiatric care.  However, when sober and evaluated at 10:00 a.m. on April 10, patient did not meet the criteria for hospitalization.  HOSPITAL COURSE:  As mentioned, patient was intoxicated and behaving bizarrely and in a dangerous fashion.  However, when I interviewed her, she was alert, oriented, coherent, and cooperative, had no suicidal thoughts, no hallucinations, and even had no recall of her bizarre behaviors.  I interviewed her a second time just to be sure that this was the case half an hour later and she was without suicidal thoughts, able to fully contract for safety.  DISCHARGE PLANS:  Patient has no scheduled psychiatric follow-up.  LABORATORY RESULTS:  See ER record.  FINAL DIAGNOSES:   AXIS I:  1. Alcoholic intoxication.            2. Blackouts secondary to alcohol.            3. 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  AXIS II:  None. AXIS III:  No acute medical problems at the present time. DD:  05/14/99 TD:  05/14/99 Job: [id number]

## 2010-07-08 ENCOUNTER — Emergency Department (HOSPITAL_COMMUNITY)
Admission: EM | Admit: 2010-07-08 | Discharge: 2010-07-08 | Disposition: A | Discharge status: Home / Self Care | Payer: Medicaid Other | Attending: Emergency Medicine | Admitting: Emergency Medicine

## 2010-07-08 DIAGNOSIS — S2190XA Unspecified open wound of unspecified part of thorax, initial encounter: Secondary | ICD-10-CM | POA: Insufficient documentation

## 2010-07-08 DIAGNOSIS — F411 Generalized anxiety disorder: Secondary | ICD-10-CM | POA: Insufficient documentation

## 2010-07-08 DIAGNOSIS — F410 Panic disorder [episodic paroxysmal anxiety] without agoraphobia: Secondary | ICD-10-CM | POA: Insufficient documentation

## 2010-09-10 ENCOUNTER — Emergency Department (HOSPITAL_COMMUNITY)
Admission: EM | Admit: 2010-09-10 | Discharge: 2010-09-10 | Discharge status: Against Medical Advice | Payer: Medicaid Other | Source: Home / Self Care | Attending: Emergency Medicine | Admitting: Emergency Medicine

## 2010-09-10 ENCOUNTER — Ambulatory Visit (HOSPITAL_COMMUNITY)
Admission: EM | Admit: 2010-09-10 | Discharge: 2010-09-12 | Disposition: A | Discharge status: Home / Self Care | Payer: Medicaid Other | Attending: Surgery | Admitting: Surgery

## 2010-09-10 DIAGNOSIS — Z01812 Encounter for preprocedural laboratory examination: Secondary | ICD-10-CM | POA: Insufficient documentation

## 2010-09-10 DIAGNOSIS — M549 Dorsalgia, unspecified: Secondary | ICD-10-CM | POA: Insufficient documentation

## 2010-09-10 DIAGNOSIS — G8929 Other chronic pain: Secondary | ICD-10-CM | POA: Insufficient documentation

## 2010-09-10 DIAGNOSIS — N949 Unspecified condition associated with female genital organs and menstrual cycle: Secondary | ICD-10-CM | POA: Insufficient documentation

## 2010-09-10 DIAGNOSIS — F411 Generalized anxiety disorder: Secondary | ICD-10-CM | POA: Insufficient documentation

## 2010-09-10 DIAGNOSIS — R1031 Right lower quadrant pain: Secondary | ICD-10-CM | POA: Insufficient documentation

## 2010-09-10 LAB — DIFFERENTIAL
Eosinophils Absolute: 0.2 10*3/uL (ref 0.0–0.7)
Eosinophils Relative: 1 % (ref 0–5)
Lymphs Abs: 4.2 10*3/uL — ABNORMAL HIGH (ref 0.7–4.0)
Monocytes Absolute: 1.3 10*3/uL — ABNORMAL HIGH (ref 0.1–1.0)
Monocytes Relative: 8 % (ref 3–12)

## 2010-09-11 ENCOUNTER — Emergency Department (HOSPITAL_COMMUNITY): Payer: Medicaid Other

## 2010-09-11 ENCOUNTER — Ambulatory Visit (HOSPITAL_COMMUNITY)
Admission: RE | Admit: 2010-09-11 | Discharge: 2010-09-11 | Disposition: A | Discharge status: Home / Self Care | Payer: Medicaid Other | Source: Ambulatory Visit | Attending: Emergency Medicine | Admitting: Emergency Medicine

## 2010-09-11 DIAGNOSIS — N83209 Unspecified ovarian cyst, unspecified side: Secondary | ICD-10-CM | POA: Insufficient documentation

## 2010-09-11 DIAGNOSIS — R1031 Right lower quadrant pain: Secondary | ICD-10-CM | POA: Insufficient documentation

## 2010-09-11 DIAGNOSIS — R109 Unspecified abdominal pain: Secondary | ICD-10-CM | POA: Insufficient documentation

## 2010-09-11 DIAGNOSIS — K389 Disease of appendix, unspecified: Secondary | ICD-10-CM | POA: Insufficient documentation

## 2010-09-11 DIAGNOSIS — D259 Leiomyoma of uterus, unspecified: Secondary | ICD-10-CM | POA: Insufficient documentation

## 2010-09-11 LAB — CBC
MCH: 22.4 pg — ABNORMAL LOW (ref 26.0–34.0)
MCHC: 31.4 g/dL (ref 30.0–36.0)
MCV: 71.5 fL — ABNORMAL LOW (ref 78.0–100.0)
Platelets: 406 10*3/uL — ABNORMAL HIGH (ref 150–400)
RDW: 19.1 % — ABNORMAL HIGH (ref 11.5–15.5)
WBC: 17.2 10*3/uL — ABNORMAL HIGH (ref 4.0–10.5)

## 2010-09-11 LAB — GC/CHLAMYDIA PROBE AMP, GENITAL
Chlamydia, DNA Probe: NEGATIVE
GC Probe Amp, Genital: NEGATIVE

## 2010-09-11 LAB — BASIC METABOLIC PANEL
BUN: 12 mg/dL (ref 6–23)
CO2: 25 mEq/L (ref 19–32)
Chloride: 108 mEq/L (ref 96–112)
Creatinine, Ser: 0.58 mg/dL (ref 0.50–1.10)
Potassium: 4.1 mEq/L (ref 3.5–5.1)

## 2010-09-11 LAB — URINALYSIS, ROUTINE W REFLEX MICROSCOPIC
Ketones, ur: 15 mg/dL — AB
Nitrite: NEGATIVE
Protein, ur: NEGATIVE mg/dL
Urobilinogen, UA: 1 mg/dL (ref 0.0–1.0)

## 2010-09-11 LAB — WET PREP, GENITAL
Trich, Wet Prep: NONE SEEN
WBC, Wet Prep HPF POC: NONE SEEN

## 2010-09-11 LAB — HEPATIC FUNCTION PANEL
ALT: 12 U/L (ref 0–35)
Bilirubin, Direct: <0.1 mg/dL (ref 0.0–0.3)
Total Bilirubin: 0.3 mg/dL (ref 0.3–1.2)

## 2010-09-11 IMAGING — CT CT ABD-PELV W/ CM
2 of 4 series · 17 of 46 positions shown, 19 images · IV contrast (APPLIED)
Comparison: [DATE]

CLINICAL DATA: Right lower quadrant abdominal pain.

CT ABDOMEN AND PELVIS WITH CONTRAST
TECHNIQUE: Multidetector CT imaging of the abdomen and pelvis was
performed following the standard protocol during bolus
administration of intravenous contrast.
Contrast: 100 ml [FQ]

[Series 2: abd/pelv with 5.0 b31f st · axial · 0.71mm/px · z∈[+494,+914]mm · 14 of 92 slices shown, 16 images]
[im 4/92  soft-tissue]
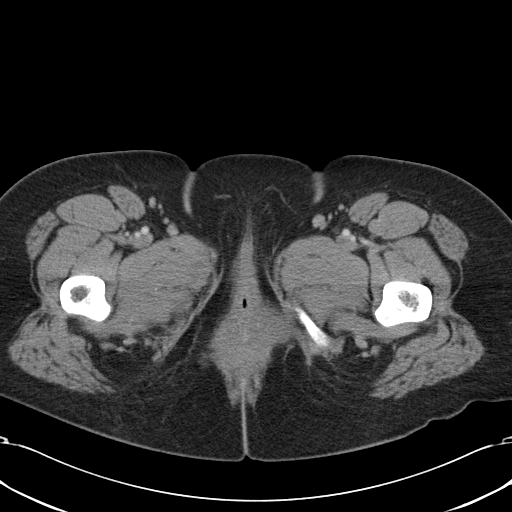
[im 4/92  bone]
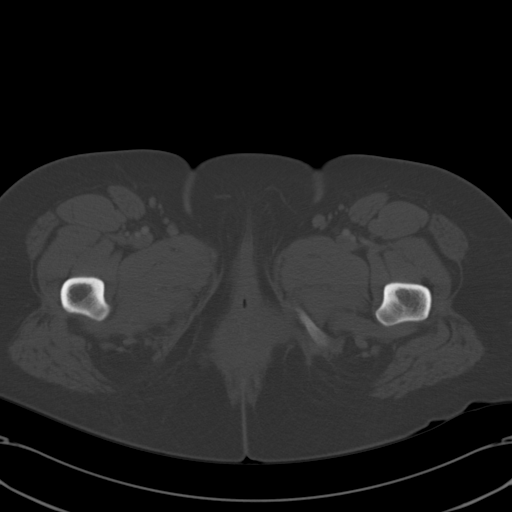
[im 11/92  soft-tissue]
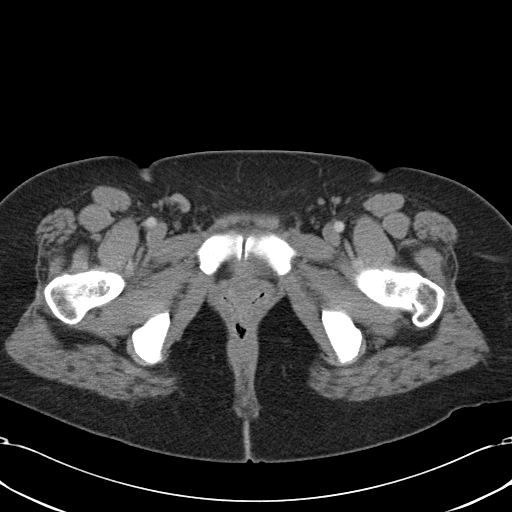
[im 19/92  soft-tissue]
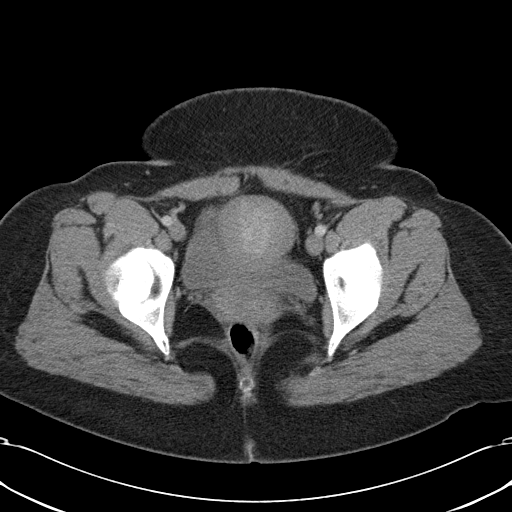
[im 26/92  soft-tissue]
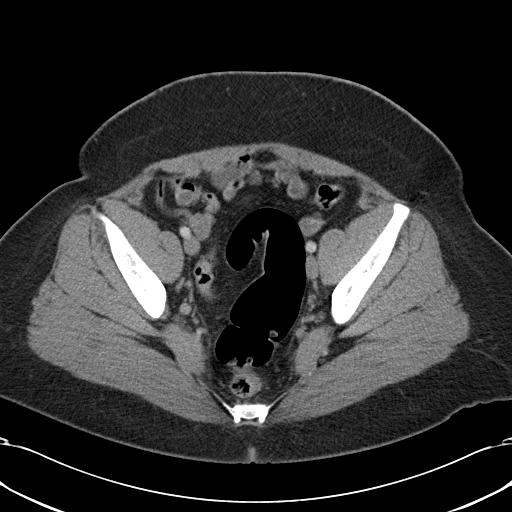
[im 30/92  soft-tissue]
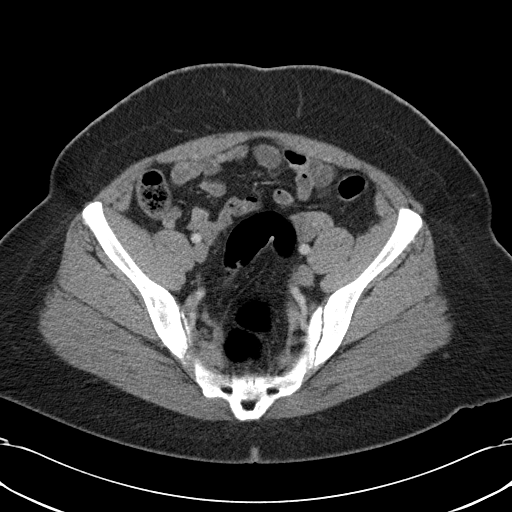
[im 37/92  soft-tissue]
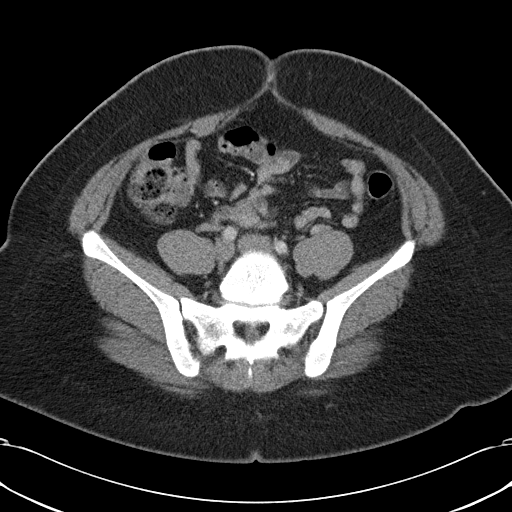
[im 44/92  soft-tissue]
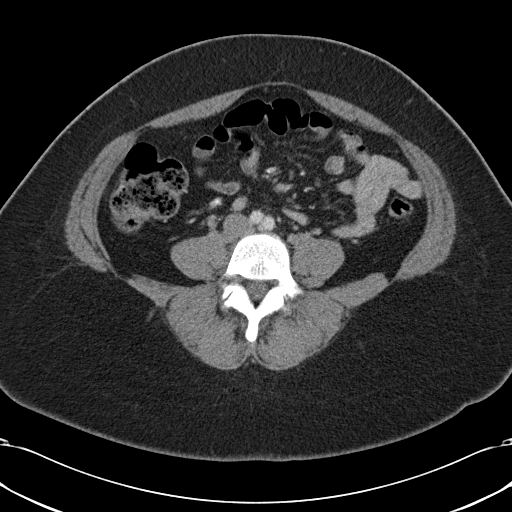
[im 48/92  soft-tissue]
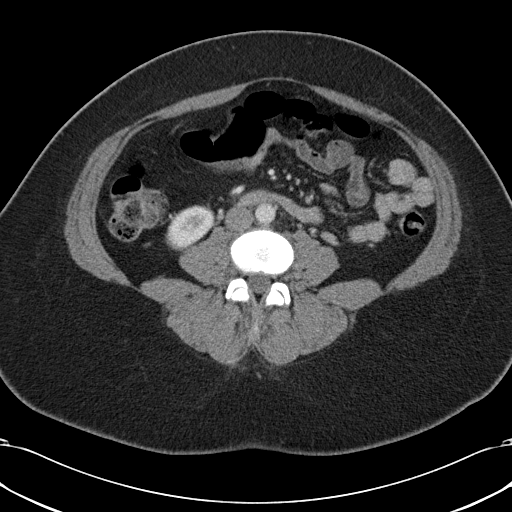
[im 55/92  soft-tissue]
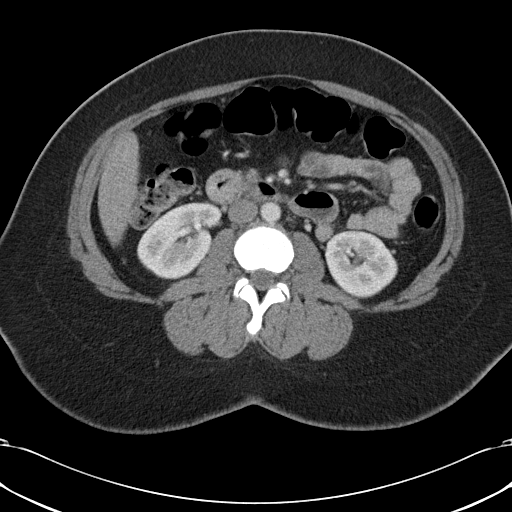
[im 55/92  bone]
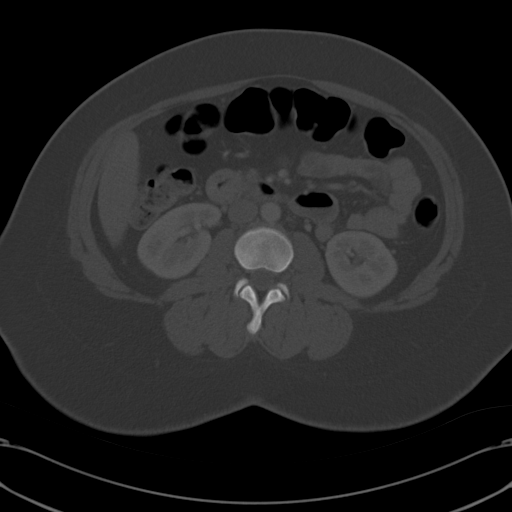
[im 62/92  soft-tissue]
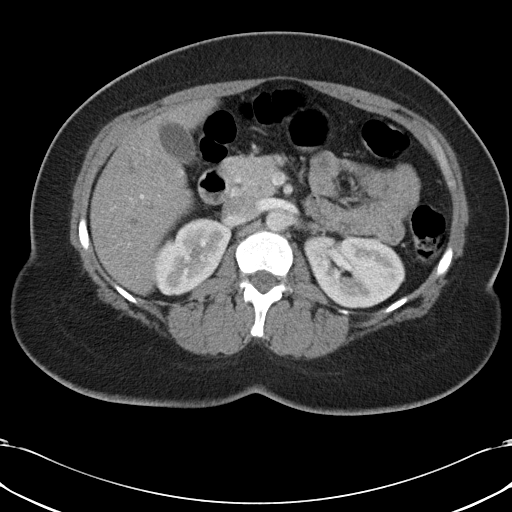
[im 70/92  soft-tissue]
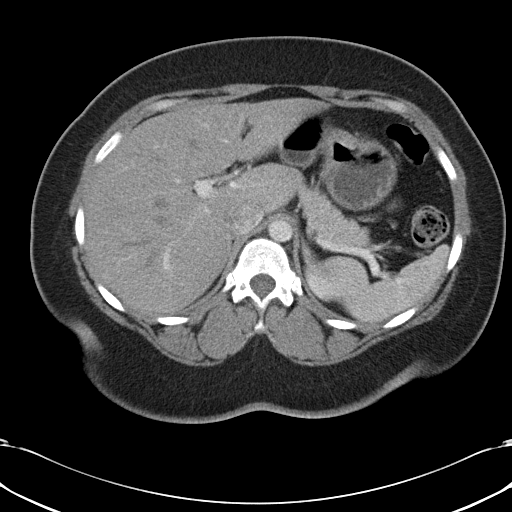
[im 73/92  soft-tissue]
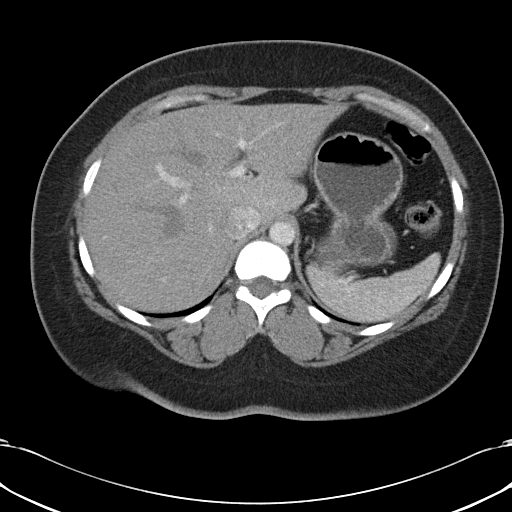
[im 81/92  soft-tissue]
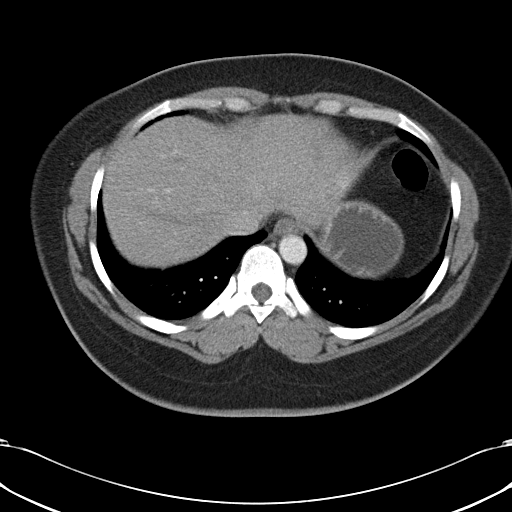
[im 88/92  soft-tissue]
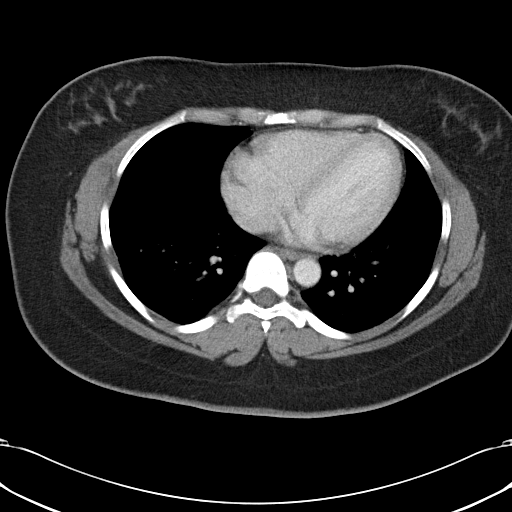

[Series 602: cor · coronal · 0.89mm/px · 3 of 128 slices shown]
[im 43/128  soft-tissue]
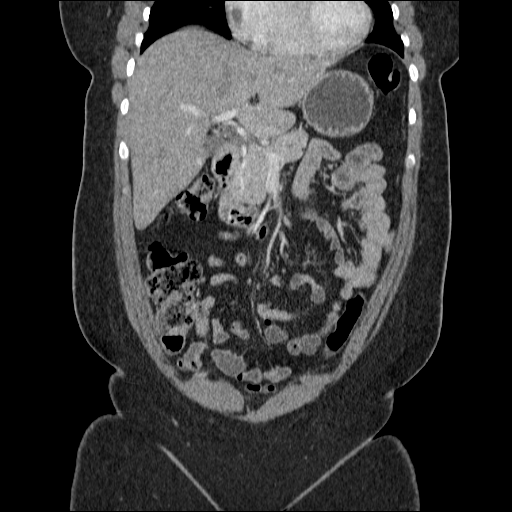
[im 57/128  soft-tissue]
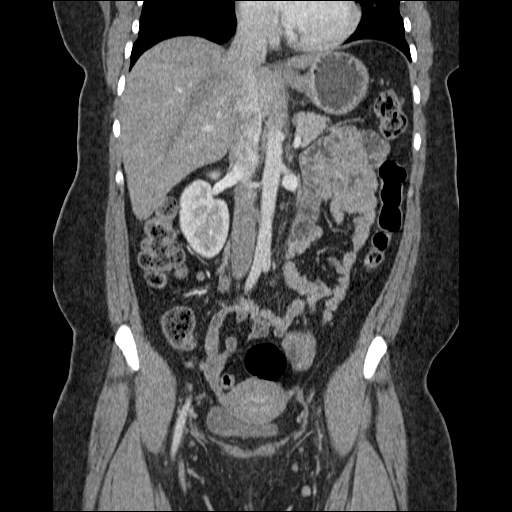
[im 71/128  soft-tissue]
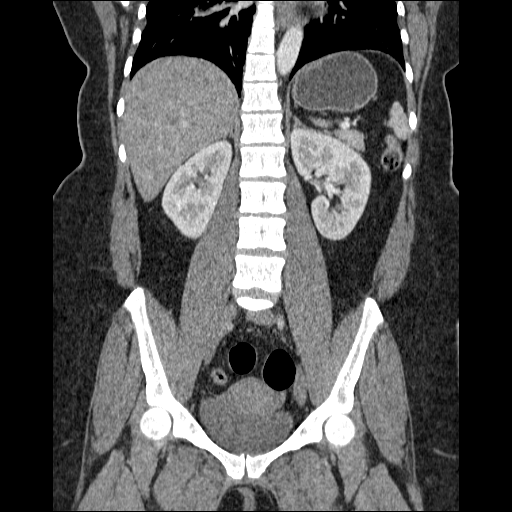

[17 of 46 positions shown; findings below may reference images not displayed]

FINDINGS: Limited images through the lung bases demonstrate no
significant appreciable abnormality. The heart size is within
normal limits. No pleural or pericardial effusion.

Low attenuation of the liver suggests fatty infiltration.
Unremarkable biliary system, spleen, pancreas, adrenal glands.  The
kidneys symmetrically enhance.  No hydronephrosis.  No urinary
tract calculi.

No bowel obstruction.  No colitis.  The appendix is normal in
caliber, without periappendiceal inflammation.  There is a small
appendicolith within the mid appendix however.

Normal vasculature.

Thin-walled bladder.  Uterus and adnexa within normal limits.  No
free fluid.  No free air.  No lymphadenopathy.

No aggressive osseous lesions.
IMPRESSION: There is a tiny appendicolith within the mid appendix however there
is no dilatation of the appendix or periappendiceal inflammation to
confirm acute appendicitis.

Otherwise, no acute abnormality.

Hepatic steatosis.

## 2010-09-11 IMAGING — US US PELVIS COMPLETE
1 series · 13 of 25 positions shown · non-contrast
Comparison: CT examination same date.

CLINICAL DATA: Pelvic pain.

TRANSABDOMINAL AND TRANSVAGINAL ULTRASOUND OF PELVIS
TECHNIQUE: Both transabdominal and transvaginal ultrasound
examinations of the pelvis were performed. Transabdominal technique
was performed for global imaging of the pelvis including uterus,
ovaries, adnexal regions, and pelvic cul-de-sac.

[Series 1: us pelvis complete · 0.26mm/px · 13 of 64 slices shown]
[im 1/64]
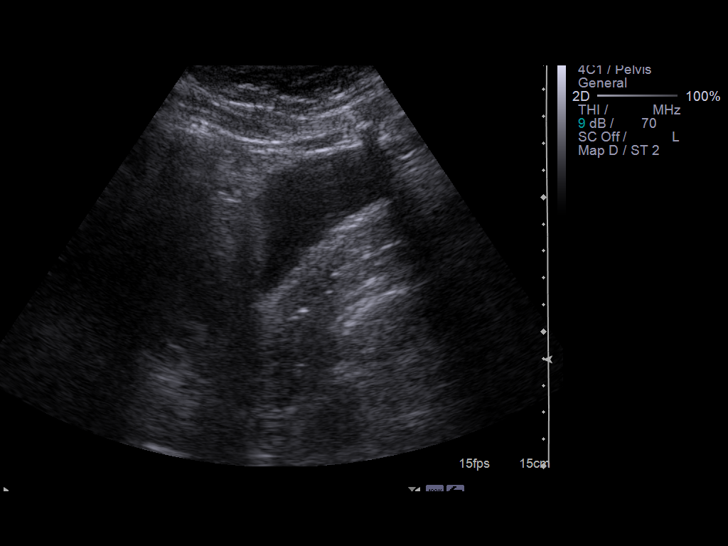
[im 6/64]
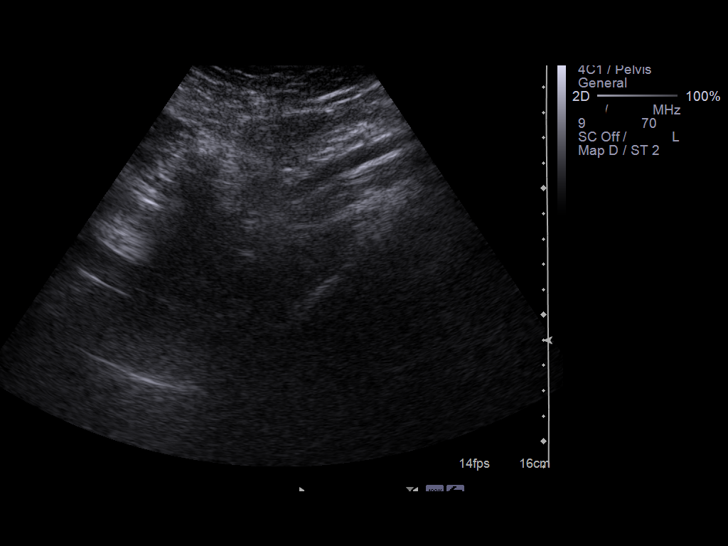
[im 11/64]
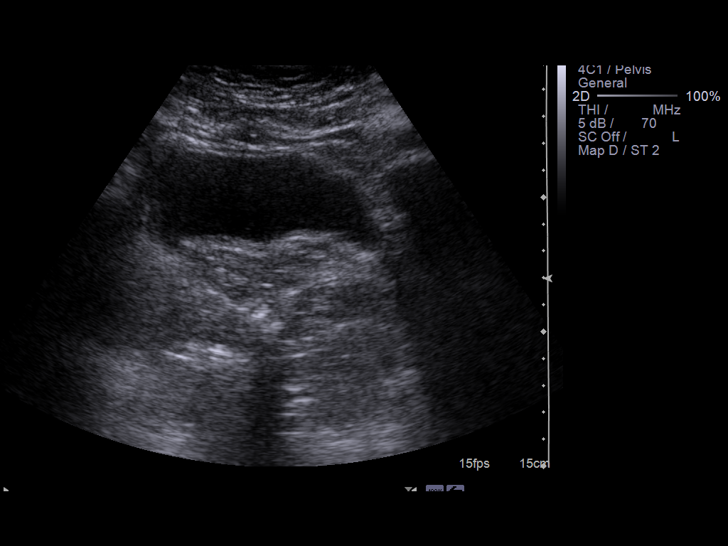
[im 16/64]
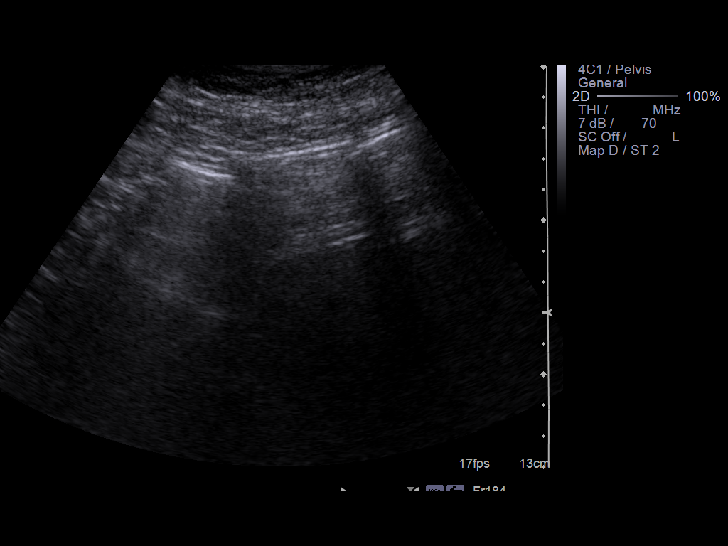
[im 22/64]
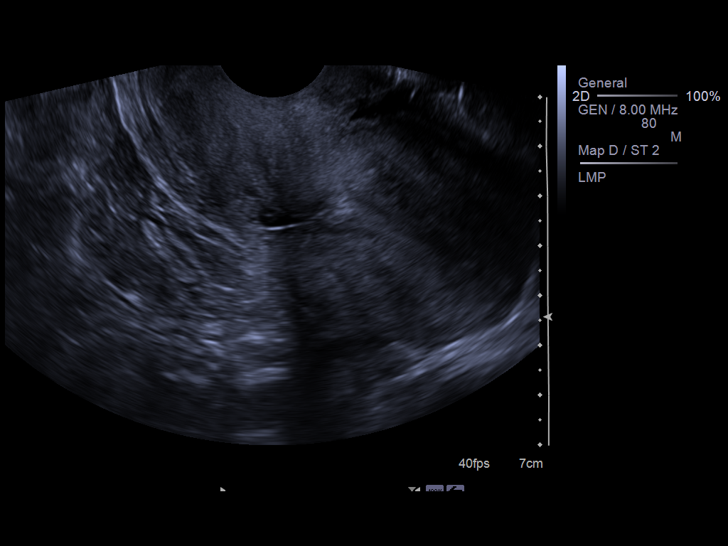
[im 27/64]
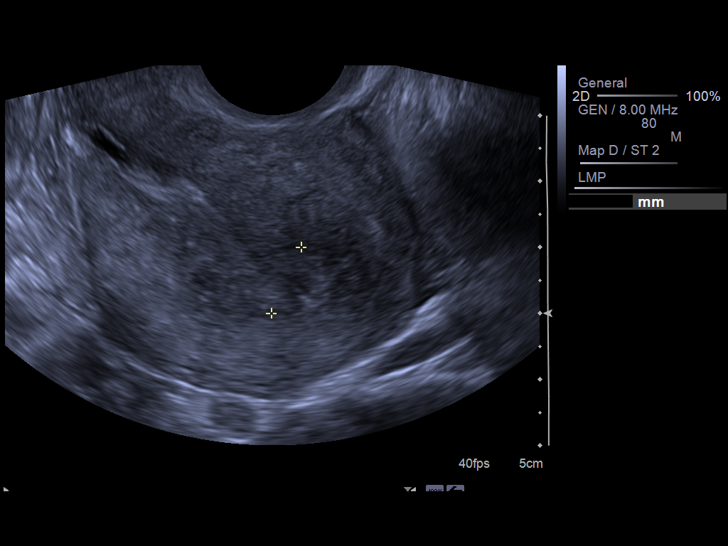
[im 32/64]
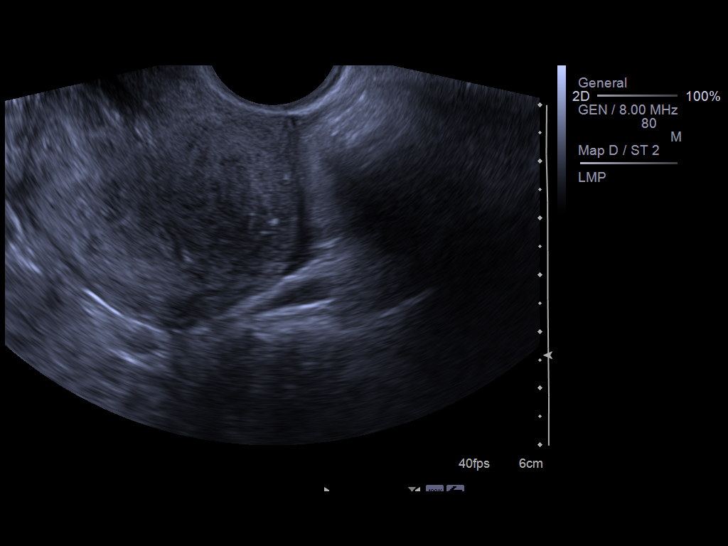
[im 37/64]
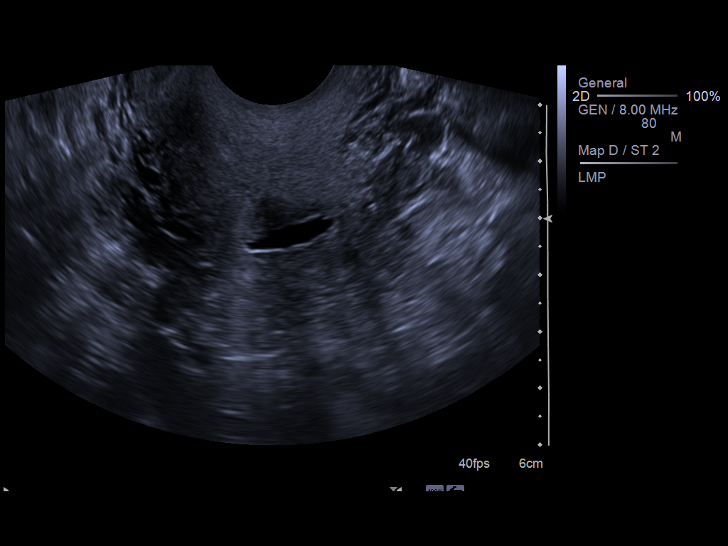
[im 43/64]
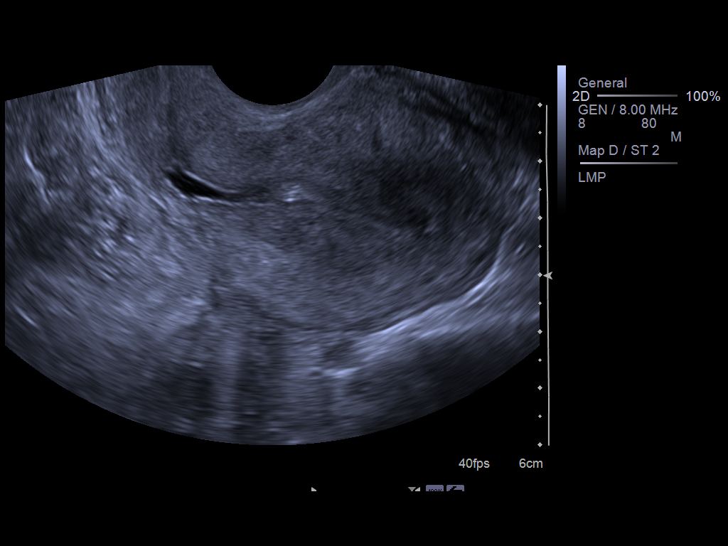
[im 48/64]
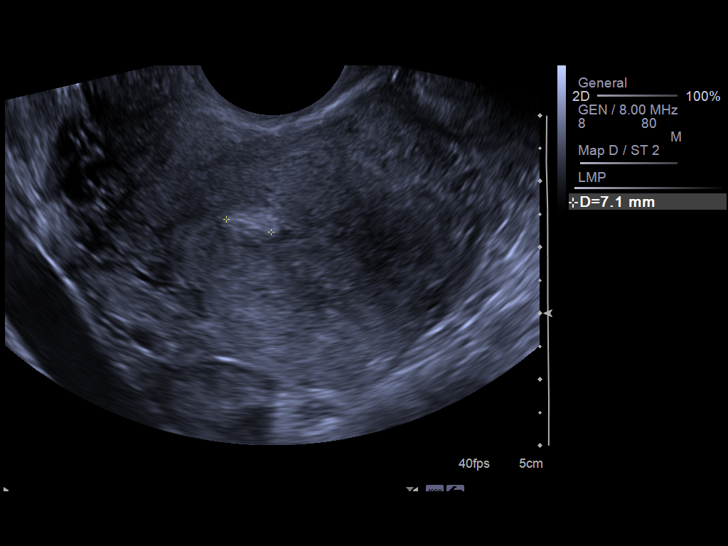
[im 53/64]
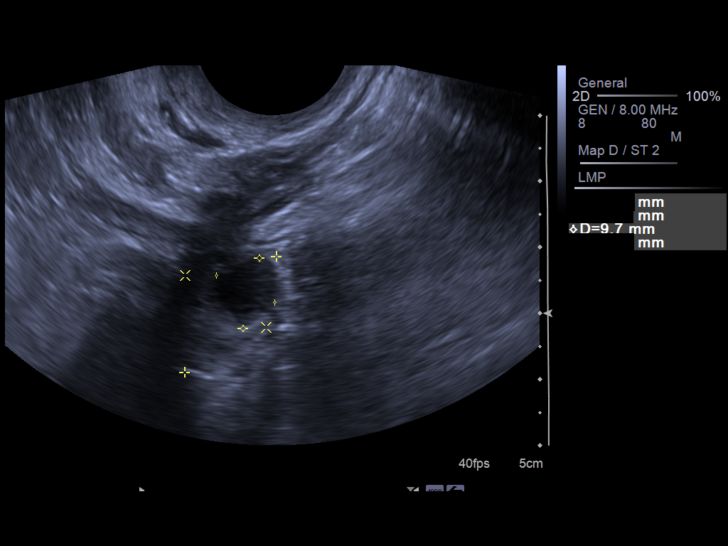
[im 58/64]
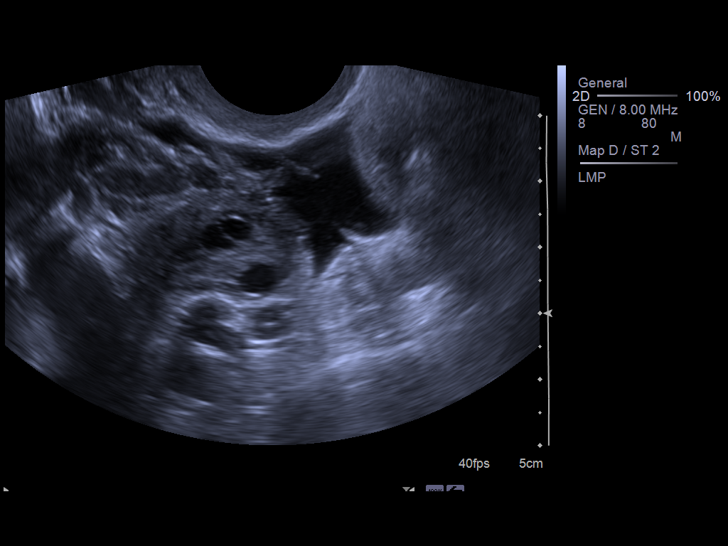
[im 64/64]
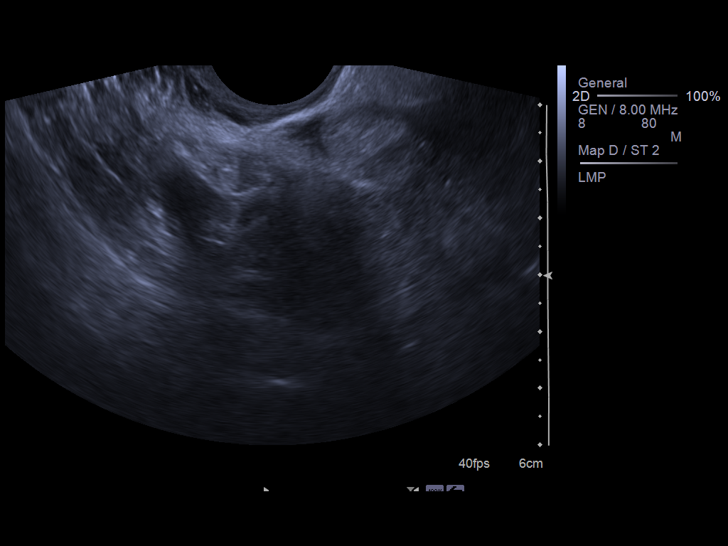

[13 of 25 positions shown; findings below may reference images not displayed]

It was necessary to proceed with endovaginal exam following the
transabdominal exam to visualize the ovaries and endometrium.
FINDINGS: Uterus: Measures 7.3 x 4.5 x 5.0 cm.  Retroversion is noted.  A
small fundal fibroid is noted.

Endometrium: Normal in thickness measuring a maximum of 11 mm.  A
small amount of fluid noted in the lower uterine segment.  A small
echogenic focus measuring 7 x 6 x 5 mm is noted.  This could
reflect a small endometrial polyp.  Hysterosonography is suggested.

Right ovary:  Measures 2.2 x 1.4 x 1.8 cm.  No cysts or masses.

Left ovary: Measures 2.2 x 1.5 x 1.8 cm.  A 1.1 x 1.0 x 1.0 cm
slightly complex cyst is noted.

Other findings: Moderate free pelvic fluid is noted and may be due
to a ruptured cyst.
IMPRESSION: 1.  Small fundal fibroid.
2.  Probable endometrial polyp.  Hysterosonography is suggested.
3.  1.1 x 1.0 x 1.0 cm complex cyst associated the left ovary.
4.  Moderate free pelvic fluid.

## 2010-09-11 MED ORDER — IOHEXOL 300 MG/ML  SOLN
100.0000 mL | Freq: Once | INTRAMUSCULAR | Status: AC | PRN
  Administered 2010-09-11: 100 mL via INTRAVENOUS

## 2010-09-12 LAB — CBC
MCH: 22.7 pg — ABNORMAL LOW (ref 26.0–34.0)
MCV: 72.2 fL — ABNORMAL LOW (ref 78.0–100.0)
Platelets: 386 10*3/uL (ref 150–400)
RBC: 3.96 MIL/uL (ref 3.87–5.11)
RDW: 19.5 % — ABNORMAL HIGH (ref 11.5–15.5)
WBC: 9.9 10*3/uL (ref 4.0–10.5)
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
## 2010-10-31 LAB — DIFFERENTIAL
Eosinophils Absolute: 0.1
Eosinophils Relative: 1
Lymphocytes Relative: 31
Lymphs Abs: 3.8 — ABNORMAL HIGH
Monocytes Relative: 10

## 2010-10-31 LAB — CBC
MCHC: 31.2
MCV: 67 — ABNORMAL LOW
Platelets: 431 — ABNORMAL HIGH
RBC: 4.03
RDW: 20 — ABNORMAL HIGH

## 2010-10-31 LAB — COMPREHENSIVE METABOLIC PANEL
ALT: 16
AST: 18
Albumin: 3.9
CO2: 25
Calcium: 9.4
Creatinine, Ser: 0.7
GFR calc Af Amer: >60
GFR calc non Af Amer: >60
Sodium: 140
Total Protein: 7.4

## 2010-10-31 LAB — URINALYSIS, ROUTINE W REFLEX MICROSCOPIC
Bilirubin Urine: NEGATIVE
Hgb urine dipstick: NEGATIVE
Nitrite: NEGATIVE
Protein, ur: NEGATIVE
Specific Gravity, Urine: 1.035 — ABNORMAL HIGH
Urobilinogen, UA: 0.2

## 2010-10-31 LAB — PREGNANCY, URINE: Preg Test, Ur: NEGATIVE

## 2010-10-31 LAB — D-DIMER, QUANTITATIVE: D-Dimer, Quant: 0.34

## 2012-03-01 ENCOUNTER — Emergency Department (HOSPITAL_BASED_OUTPATIENT_CLINIC_OR_DEPARTMENT_OTHER)
Admission: EM | Admit: 2012-03-01 | Discharge: 2012-03-02 | Disposition: A | Discharge status: Home / Self Care | Payer: Self-pay | Attending: Emergency Medicine | Admitting: Emergency Medicine

## 2012-03-01 ENCOUNTER — Encounter (HOSPITAL_BASED_OUTPATIENT_CLINIC_OR_DEPARTMENT_OTHER): Payer: Self-pay | Admitting: *Deleted

## 2012-03-01 DIAGNOSIS — N898 Other specified noninflammatory disorders of vagina: Secondary | ICD-10-CM | POA: Insufficient documentation

## 2012-03-01 DIAGNOSIS — R110 Nausea: Secondary | ICD-10-CM | POA: Insufficient documentation

## 2012-03-01 DIAGNOSIS — A599 Trichomoniasis, unspecified: Secondary | ICD-10-CM | POA: Insufficient documentation

## 2012-03-01 DIAGNOSIS — M549 Dorsalgia, unspecified: Secondary | ICD-10-CM | POA: Insufficient documentation

## 2012-03-01 LAB — URINALYSIS, ROUTINE W REFLEX MICROSCOPIC
Bilirubin Urine: NEGATIVE
Ketones, ur: NEGATIVE mg/dL
Nitrite: NEGATIVE
Specific Gravity, Urine: 1.015 (ref 1.005–1.030)
pH: 7 (ref 5.0–8.0)

## 2012-03-01 LAB — URINE MICROSCOPIC-ADD ON

## 2012-03-01 LAB — PREGNANCY, URINE: Preg Test, Ur: NEGATIVE

## 2012-03-01 NOTE — ED Notes (Signed)
Abd pain x 1 week. States she just finished her period and was using a tampon. Went to take it out and there was a lump/knot. Blood tinged mucosy discharge. Denies urinary s/s.

## 2012-03-02 LAB — WET PREP, GENITAL: Yeast Wet Prep HPF POC: NONE SEEN

## 2012-03-02 MED ORDER — AZITHROMYCIN 1 G PO PACK
1.0000 g | PACK | Freq: Once | ORAL | Status: AC
  Administered 2012-03-02: 1 g via ORAL
  Filled 2012-03-02: qty 1

## 2012-03-02 MED ORDER — ONDANSETRON 8 MG PO TBDP
8.0000 mg | ORAL_TABLET | Freq: Once | ORAL | Status: AC
  Administered 2012-03-02: 8 mg via ORAL
  Filled 2012-03-02: qty 1

## 2012-03-02 MED ORDER — TRAMADOL HCL 50 MG PO TABS
50.0000 mg | ORAL_TABLET | Freq: Once | ORAL | Status: AC
  Administered 2012-03-02: 50 mg via ORAL
  Filled 2012-03-02: qty 1

## 2012-03-02 MED ORDER — METRONIDAZOLE 500 MG PO TABS
2000.0000 mg | ORAL_TABLET | Freq: Once | ORAL | Status: AC
  Administered 2012-03-02: 2000 mg via ORAL
  Filled 2012-03-02: qty 4

## 2012-03-02 MED ORDER — CEFTRIAXONE SODIUM 250 MG IJ SOLR
250.0000 mg | Freq: Once | INTRAMUSCULAR | Status: AC
  Administered 2012-03-02: 250 mg via INTRAMUSCULAR
  Filled 2012-03-02: qty 250

## 2012-03-02 NOTE — ED Provider Notes (Signed)
History  This chart was scribed for Raghav Verrilli Smitty Cords, MD by Shari Heritage, ED Scribe. The patient was seen in room MH10/MH10. Patient's care was started at 0002.   CSN: [id number]  Arrival date & time 03/01/12  2317   First MD Initiated Contact with Patient 03/02/12 0002      Chief Complaint  Patient presents with  . Abdominal Pain    Patient is a 32 y.o. female presenting with abdominal pain. The history is provided by the patient. No language interpreter was used.  Abdominal Pain Pain location:  Suprapubic Pain radiates to:  Does not radiate Pain severity:  Moderate Duration:  1 week Timing:  Intermittent Progression:  Unchanged Chronicity:  New Relieved by:  Nothing Associated symptoms: nausea and vaginal discharge   Associated symptoms: no diarrhea, no fever and no vomiting      HPI Comments: Caroline Carlson is a 32 y.o. female who presents to the Emergency Department complaining of intermittent, mild to moderate, contiguous suprapubic abdominal and back pain onset 1 week ago. Patient states that pain comes on when she stands up for long periods of time. Associated symptoms include intermittent back pain, nausea and vaginal discharge. Nausea began 1 week ago. Patient states that today she noticed a "lump" in her pelvic area, but this "lump" sensation is now resolved. She also reports that she had a sudden blood-mucous like discharge today prompting her to come to the ED for evaluation. She describes the sensation as a gush of fluid that felt like her water was breaking. She denies any recent history of infections. Patient denies vomiting, diarrhea or dysuria. Patient's last pap smear was 2 years ago. She does not have a OB/GYN. Patient denies any new sexual partners or possibility of STD.    History reviewed. No pertinent past medical history.  Past Surgical History  Procedure Laterality Date  . Cesarean section      History reviewed. No pertinent family  history.  History  Substance Use Topics  . Smoking status: Never Smoker   . Smokeless tobacco: Not on file  . Alcohol Use: Yes    OB History   Grav Para Term Preterm Abortions TAB SAB Ect Mult Living                  Review of Systems  Constitutional: Negative for fever.  Gastrointestinal: Positive for nausea and abdominal pain. Negative for vomiting and diarrhea.  Genitourinary: Positive for vaginal discharge.  Musculoskeletal: Positive for back pain.  All other systems reviewed and are negative.    Allergies  Review of patient's allergies indicates no known allergies.  Home Medications  No current outpatient prescriptions on file.  Triage Vitals: BP 133/70  Pulse 92  Temp(Src) 99.3 F (37.4 C) (Oral)  Resp 20  Ht 5\' 5"  (1.651 m)  Wt 160 lb (72.576 kg)  BMI 26.63 kg/m2  SpO2 97%  LMP 02/27/2012  Physical Exam  Constitutional: She is oriented to person, place, and time. She appears well-developed and well-nourished.  HENT:  Head: Normocephalic and atraumatic.  Mouth/Throat: Oropharynx is clear and moist and mucous membranes are normal. Mucous membranes are not dry.  Eyes: Conjunctivae and EOM are normal. Pupils are equal, round, and reactive to light.  Neck: Normal range of motion. Neck supple.  Cardiovascular: Normal rate and regular rhythm.   Pulmonary/Chest: Effort normal and breath sounds normal. No respiratory distress. She has no wheezes. She has no rales.  Abdominal: Soft. Bowel sounds are normal. She  exhibits no distension. There is no tenderness. There is no rebound and no guarding.  Genitourinary:  Normal external exam. Cervical os is closed. Scant discharge. No CMT. No adnexal tenderness or masses. Chaperone present for pelvic exam.  Musculoskeletal: Normal range of motion.  Neurological: She is alert and oriented to person, place, and time.  Skin: Skin is warm and dry. No rash noted.  Psychiatric: She has a normal mood and affect.    ED Course   Procedures (including critical care time) DIAGNOSTIC STUDIES: Oxygen Saturation is 97% on room air, adequate by my interpretation.    COORDINATION OF CARE: 12:20 AM- Patient informed of current plan for treatment and evaluation and agrees with plan at this time.    Labs Reviewed  WET PREP, GENITAL - Abnormal; Notable for the following:    Trich, Wet Prep MANY (*)    Clue Cells Wet Prep HPF POC MODERATE (*)    WBC, Wet Prep HPF POC FEW (*)    All other components within normal limits  URINALYSIS, ROUTINE W REFLEX MICROSCOPIC - Abnormal; Notable for the following:    Hgb urine dipstick MODERATE (*)    Leukocytes, UA SMALL (*)    All other components within normal limits  GC/CHLAMYDIA PROBE AMP  PREGNANCY, URINE  URINE MICROSCOPIC-ADD ON    No results found.   No diagnosis found.    MDM  With patient's nurse Windell Moulding present EDP reviewed findings of urine and wet prep.  EDP expressed empathy and asked if patient had any questions.  Advised no sexual acitivity with her partner until both partners treated and recheck in 7 days  Will treat for trichomonas GC and chlamydia     I personally performed the services described in this documentation, which was scribed in my presence. The recorded information has been reviewed and is accurate.    Jasmine Awe, MD 03/02/12 [phone]

## 2012-03-02 NOTE — ED Notes (Addendum)
MD at bedside giving test results and plan of care.  MD gave detailed explanation of current situation and tx options.  Gave plan for followup care and importance of partner tx. Pt had no questions. Pt agreeable with tx plan.

## 2012-03-03 LAB — GC/CHLAMYDIA PROBE AMP: CT Probe RNA: NEGATIVE

## 2012-08-14 ENCOUNTER — Other Ambulatory Visit: Payer: Self-pay | Admitting: Advanced Practice Midwife

## 2012-08-14 ENCOUNTER — Ambulatory Visit (INDEPENDENT_AMBULATORY_CARE_PROVIDER_SITE_OTHER): Payer: Medicaid Other | Admitting: Advanced Practice Midwife

## 2012-08-14 ENCOUNTER — Encounter: Payer: Self-pay | Admitting: Advanced Practice Midwife

## 2012-08-14 VITALS — BP 105/71 | HR 87 | Temp 98.2°F | Ht 65.0 in | Wt 190.8 lb

## 2012-08-14 DIAGNOSIS — N9489 Other specified conditions associated with female genital organs and menstrual cycle: Secondary | ICD-10-CM

## 2012-08-14 DIAGNOSIS — R102 Pelvic and perineal pain: Secondary | ICD-10-CM | POA: Insufficient documentation

## 2012-08-14 DIAGNOSIS — F329 Major depressive disorder, single episode, unspecified: Secondary | ICD-10-CM | POA: Insufficient documentation

## 2012-08-14 DIAGNOSIS — N949 Unspecified condition associated with female genital organs and menstrual cycle: Secondary | ICD-10-CM

## 2012-08-14 DIAGNOSIS — N939 Abnormal uterine and vaginal bleeding, unspecified: Secondary | ICD-10-CM

## 2012-08-14 DIAGNOSIS — F32A Depression, unspecified: Secondary | ICD-10-CM | POA: Insufficient documentation

## 2012-08-14 DIAGNOSIS — Z01419 Encounter for gynecological examination (general) (routine) without abnormal findings: Secondary | ICD-10-CM

## 2012-08-14 LAB — CBC WITH DIFFERENTIAL/PLATELET
Basophils Absolute: 0 10*3/uL (ref 0.0–0.1)
Basophils Relative: 0 % (ref 0–1)
Eosinophils Relative: 1 % (ref 0–5)
HCT: 37.1 % (ref 36.0–46.0)
Lymphocytes Relative: 24 % (ref 12–46)
MCHC: 32.6 g/dL (ref 30.0–36.0)
MCV: 84.5 fL (ref 78.0–100.0)
Monocytes Absolute: 1 10*3/uL (ref 0.1–1.0)
Neutro Abs: 10.3 10*3/uL — ABNORMAL HIGH (ref 1.7–7.7)
Platelets: 355 10*3/uL (ref 150–400)
RDW: 19 % — ABNORMAL HIGH (ref 11.5–15.5)
WBC: 15.1 10*3/uL — ABNORMAL HIGH (ref 4.0–10.5)

## 2012-08-14 LAB — POCT URINALYSIS DIPSTICK
Bilirubin, UA: NEGATIVE
Glucose, UA: NEGATIVE
Ketones, UA: NEGATIVE
Spec Grav, UA: 1.015
Urobilinogen, UA: NEGATIVE

## 2012-08-14 MED ORDER — SERTRALINE HCL 25 MG PO TABS: 25.0000 mg | ORAL_TABLET | Freq: Every day | ORAL | Status: DC

## 2012-08-14 MED ORDER — SERTRALINE HCL 50 MG PO TABS: 50.0000 mg | ORAL_TABLET | Freq: Every day | ORAL | Status: DC

## 2012-08-14 NOTE — Progress Notes (Signed)
. Subjective:     Caroline Carlson is a 32 y.o. female here for a routine exam.  Current complaints: Constant back and pelvic pain for about one year.  Personal health questionnaire reviewed: yes.  Patient is having depression, situational due to marital stress. Currently abstinent however would like STI screening. BTL for birth control. Has not had a pap since 2006. Recently got insurance.    Gynecologic History Patient's last menstrual period was 08/14/2012. Contraception: none Last Pap: unknown.  History of an abnormal pap smear - prior to 2006 Last mammogram: N/A  Obstetric History OB History   Grav Para Term Preterm Abortions TAB SAB Ect Mult Living   3 3 3       3      # Outc Date GA Lbr Len/2nd Wgt Sex Del Anes PTL Lv   1 TRM            2 TRM            3 TRM                The following portions of the patient's history were reviewed and updated as appropriate: allergies, current medications, past family history, past medical history, past social history, past surgical history and problem list.  Review of Systems A comprehensive review of systems was negative except for: Genitourinary: positive for abnormal menstrual periods and uterine cramping and pain daily, 10/10 in pain in the morning. Taking ibubrofen frequently throughout the day. Pain worse in morning, relieved w/ ibuprofen, rest, heat. Worsens w/ ambulation and working on feet all day.     Objective:    BP 105/71  Pulse 87  Temp(Src) 98.2 F (36.8 C) (Oral)  Ht 5\' 5"  (1.651 m)  Wt 190 lb 12.8 oz (86.546 kg)  BMI 31.75 kg/m2  LMP 08/14/2012  General Appearance:    Alert, cooperative, no distress, appears stated age  Head:    Normocephalic, without obvious abnormality, atraumatic  Eyes:    PERRL, conjunctiva/corneas clear, EOM's intact, fundi    benign, both eyes  Ears:    Normal TM's and external ear canals, both ears  Nose:   Nares normal, septum midline, mucosa normal, no drainage    or sinus  tenderness  Throat:   Lips, mucosa, and tongue normal; teeth and gums normal  Neck:   Supple, symmetrical, trachea midline, no adenopathy;    thyroid:  no enlargement/tenderness/nodules; no carotid   bruit or JVD  Back:     Symmetric, no curvature, ROM normal, no CVA tenderness  Lungs:     Clear to auscultation bilaterally, respirations unlabored  Chest Wall:    No tenderness or deformity   Heart:    Regular rate and rhythm, S1 and S2 normal, no murmur, rub   or gallop  Breast Exam:    No tenderness, masses, or nipple abnormality  Abdomen:     Soft, non-tender, bowel sounds active all four quadrants,    no masses, no organomegaly  Genitalia:    Normal female without lesion, discharge. Right lower quadrant was tender, fullness on right side, possible enlargement of right ovary vs uterus. Difficult to palpate due to habitus.      Extremities:   Extremities normal, atraumatic, no cyanosis or edema  Pulses:   2+ and symmetric all extremities  Skin:   Skin color, texture, turgor normal, no rashes or lesions  Lymph nodes:   Cervical, supraclavicular, and axillary nodes normal  Neurologic:   CNII-XII intact,  normal strength, sensation and reflexes    throughout      Assessment:      Patient Active Problem List   Diagnosis Date Noted  . Pelvic pain 08/14/2012  . Abnormal uterine bleeding 08/14/2012  . Depression 08/14/2012     Plan:    Education reviewed: depression evaluation, self breast exams, weight bearing exercise and Reviewed increasing Vitamin D, encouraged healthy diet and exercise. Encouraged methods to help depression.. Contraception: tubal ligation. Follow up in: 2 weeks.   Patient to f/u with MD for further evaluation and to establish plan of care based off of findings from [place]. Pelvic US next week.  Started on Zoloft today for depression, 25 mg x7 days to increase to 50 mg in 1 week. Patient to call w/ concerns. Will f/u with me in 3 months for plan of care. Encouraged  patient to see counseling services and PCP. Labs drawn today and pending.  Pap pending.  45 min spent with patient greater than 90% spent in counseling and coordination of care.   Amy Wilson Singer CNM

## 2012-08-14 NOTE — Addendum Note (Signed)
Addended by: George Hugh on: 08/14/2012 01:56 PM   Modules accepted: Orders

## 2012-08-14 NOTE — Addendum Note (Signed)
Addended by: George Hugh on: 08/14/2012 02:20 PM   Modules accepted: Orders

## 2012-08-15 LAB — HIV ANTIBODY (ROUTINE TESTING W REFLEX): HIV: NONREACTIVE

## 2012-08-15 LAB — COMPLETE METABOLIC PANEL WITH GFR
Albumin: 4.4 g/dL (ref 3.5–5.2)
Alkaline Phosphatase: 74 U/L (ref 39–117)
BUN: 9 mg/dL (ref 6–23)
CO2: 20 mEq/L (ref 19–32)
Calcium: 9.3 mg/dL (ref 8.4–10.5)
Chloride: 108 mEq/L (ref 96–112)
GFR, Est Non African American: >89 mL/min
Glucose, Bld: 77 mg/dL (ref 70–99)
Potassium: 3.8 mEq/L (ref 3.5–5.3)
Sodium: 139 mEq/L (ref 135–145)
Total Protein: 7.3 g/dL (ref 6.0–8.3)

## 2012-08-15 LAB — TSH: TSH: 1.251 u[IU]/mL (ref 0.350–4.500)

## 2012-08-15 LAB — VITAMIN D 25 HYDROXY (VIT D DEFICIENCY, FRACTURES): Vit D, 25-Hydroxy: 36 ng/mL (ref 30–89)

## 2012-08-15 LAB — RPR

## 2012-08-15 LAB — LIPID PANEL: LDL Cholesterol: 143 mg/dL — ABNORMAL HIGH (ref 0–99)

## 2012-08-17 ENCOUNTER — Encounter: Payer: Self-pay | Admitting: Advanced Practice Midwife

## 2012-08-17 LAB — PAP IG AND HPV HIGH-RISK

## 2012-08-19 ENCOUNTER — Ambulatory Visit (HOSPITAL_COMMUNITY)
Admission: RE | Admit: 2012-08-19 | Discharge: 2012-08-19 | Disposition: A | Discharge status: Home / Self Care | Payer: Medicaid Other | Source: Ambulatory Visit | Attending: Advanced Practice Midwife | Admitting: Advanced Practice Midwife

## 2012-08-19 DIAGNOSIS — N949 Unspecified condition associated with female genital organs and menstrual cycle: Secondary | ICD-10-CM | POA: Insufficient documentation

## 2012-08-19 DIAGNOSIS — N938 Other specified abnormal uterine and vaginal bleeding: Secondary | ICD-10-CM | POA: Insufficient documentation

## 2012-08-19 DIAGNOSIS — N939 Abnormal uterine and vaginal bleeding, unspecified: Secondary | ICD-10-CM

## 2012-08-19 DIAGNOSIS — D251 Intramural leiomyoma of uterus: Secondary | ICD-10-CM | POA: Insufficient documentation

## 2012-08-19 IMAGING — US US TRANSVAGINAL NON-OB
1 series · 14 of 25 positions shown · non-contrast
Comparison: [DATE]

CLINICAL DATA: Abnormal uterine bleeding and pelvic pain



[Series 1: us pelvis complete · 14 of 54 slices shown]
[im 1/54]
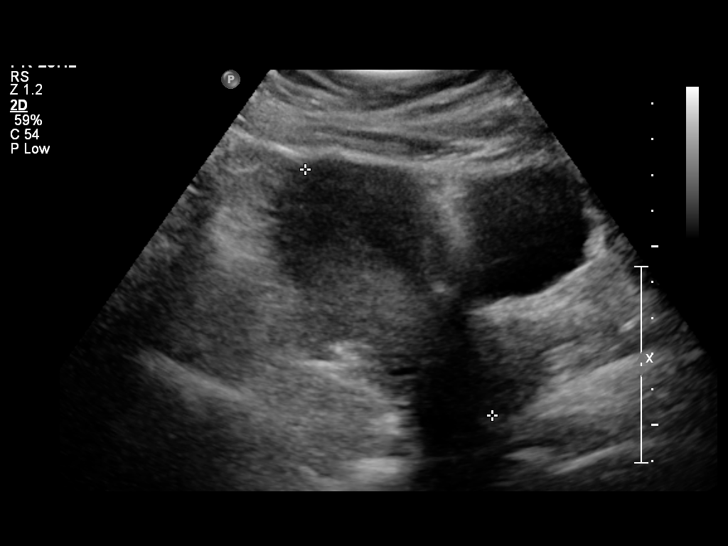
[im 5/54]
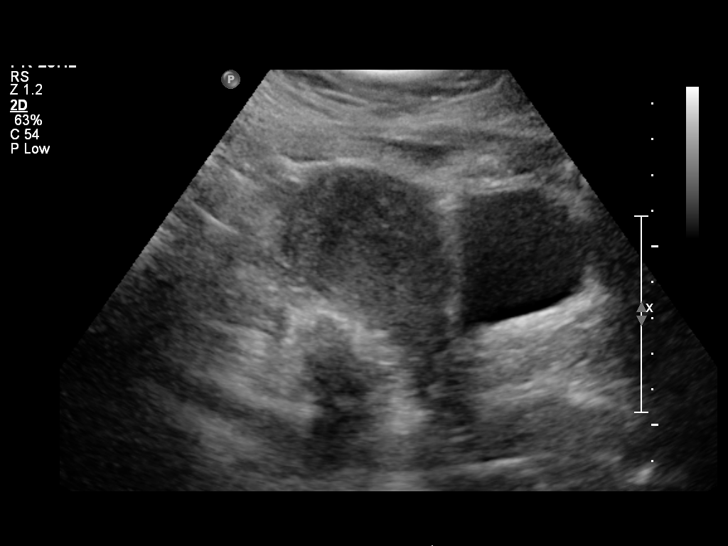
[im 9/54]
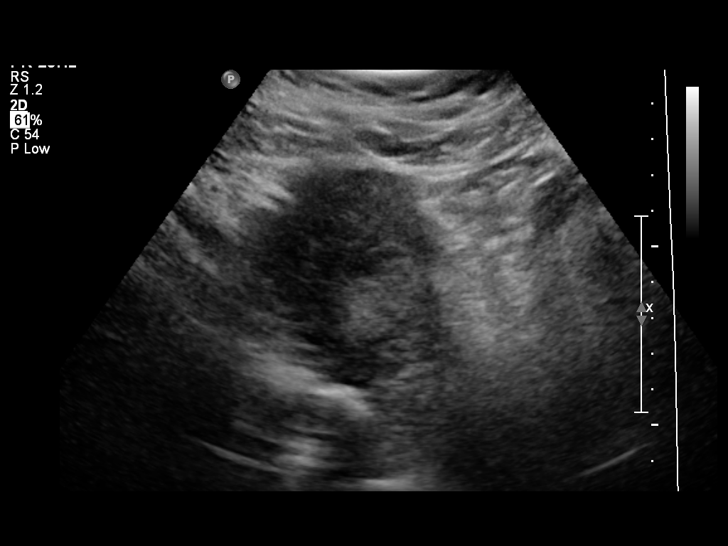
[im 14/54]
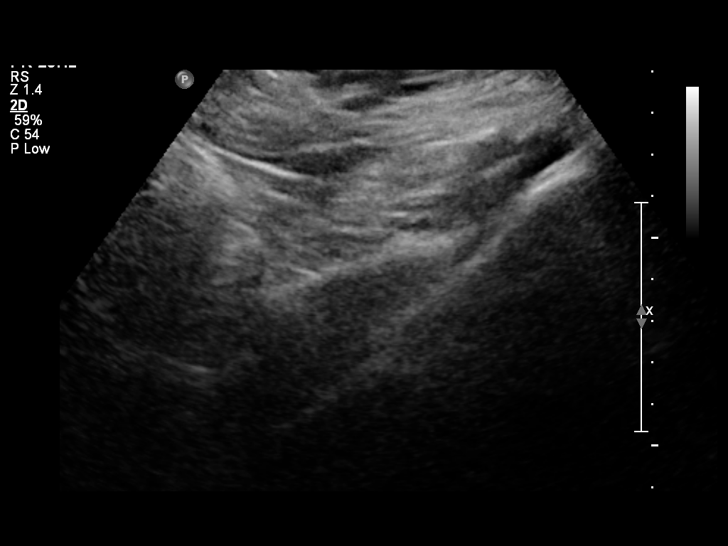
[im 18/54]
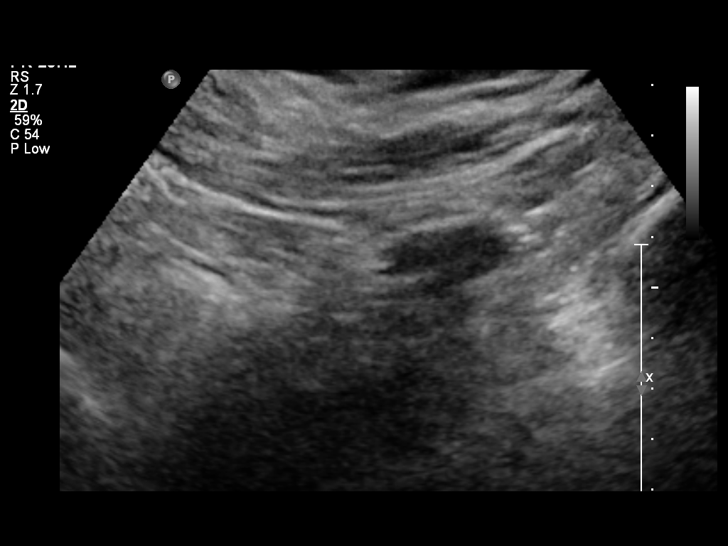
[im 20/54]
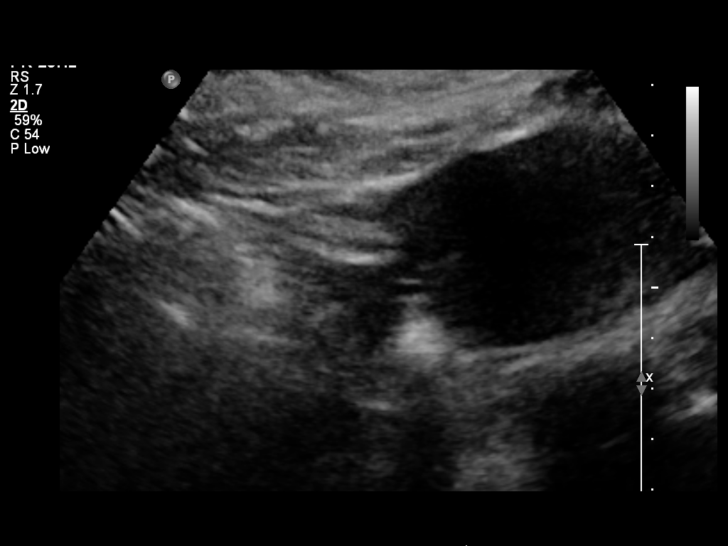
[im 25/54]
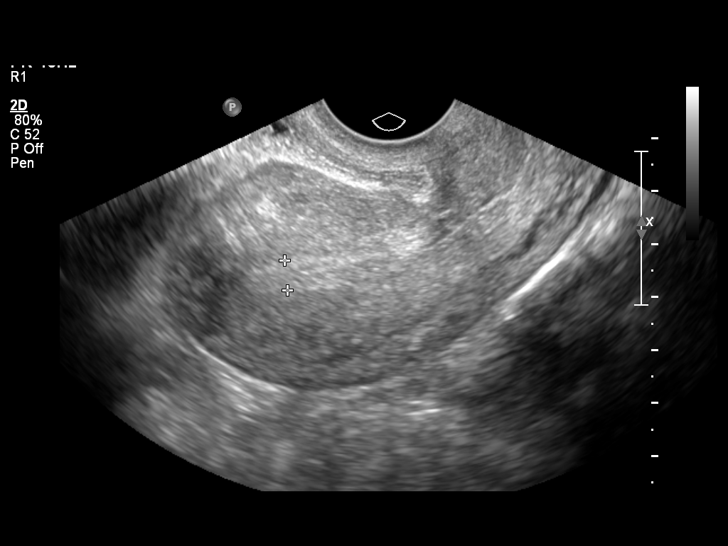
[im 29/54]
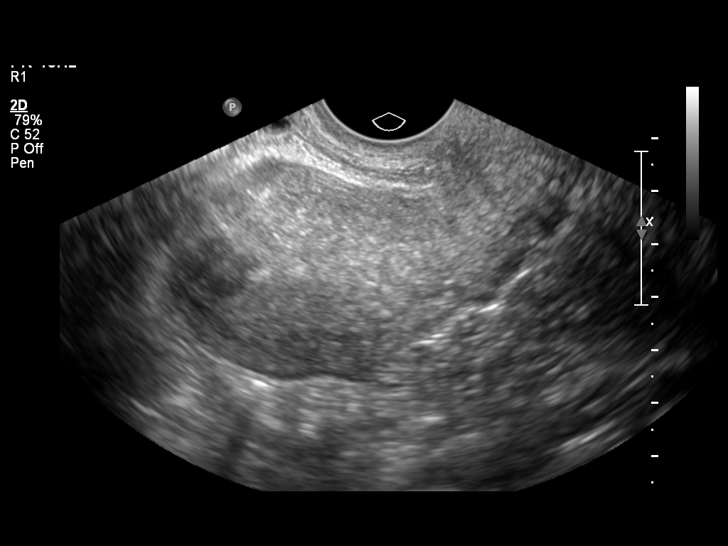
[im 34/54]
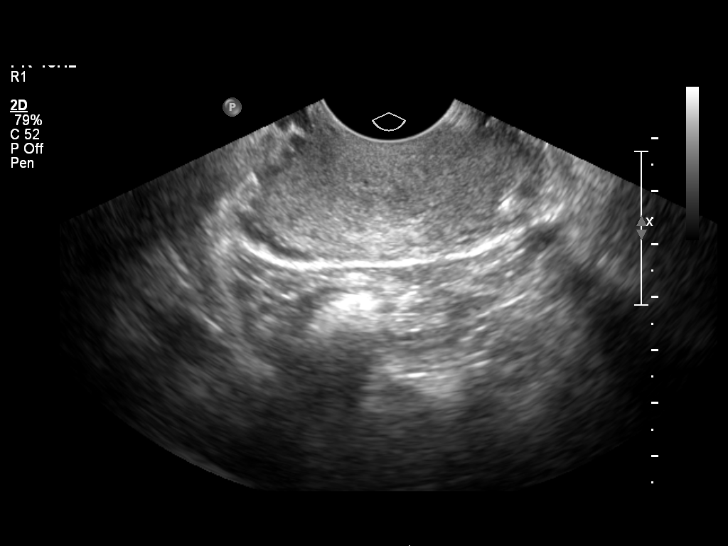
[im 36/54]
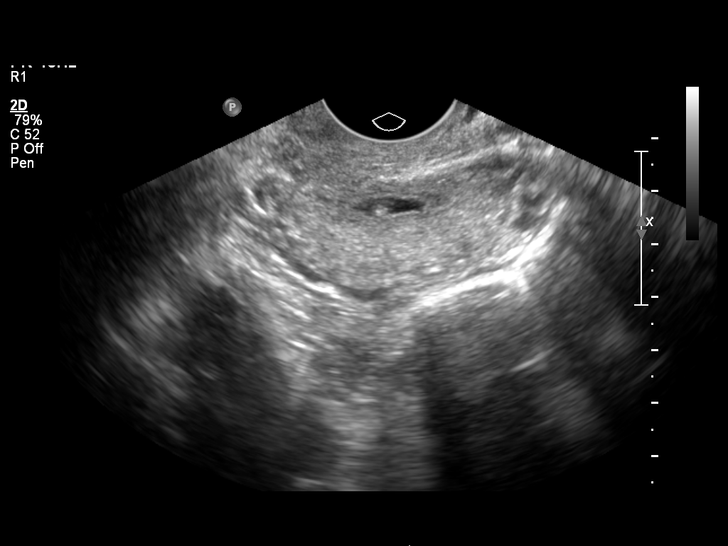
[im 40/54]
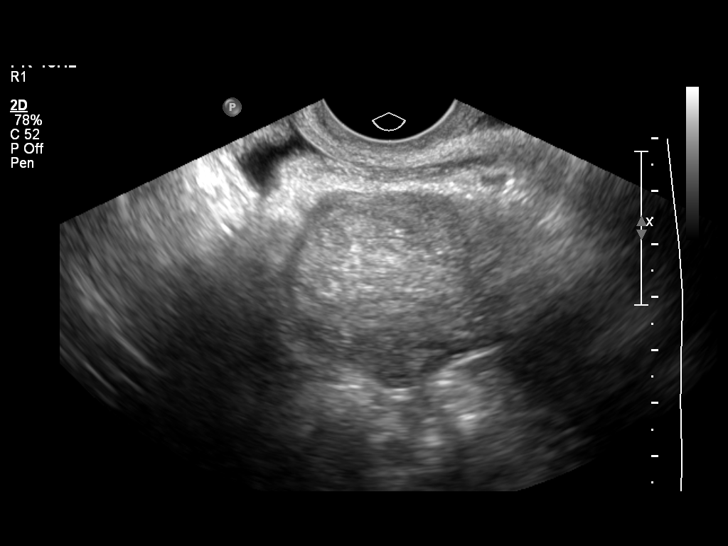
[im 45/54]
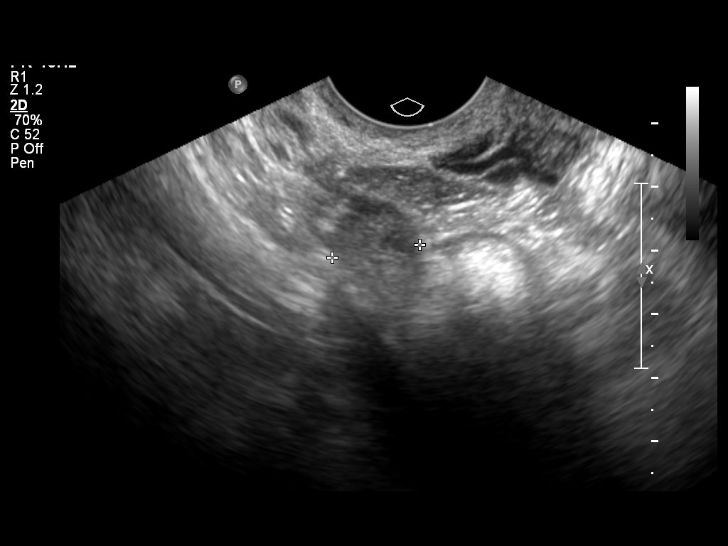
[im 49/54]
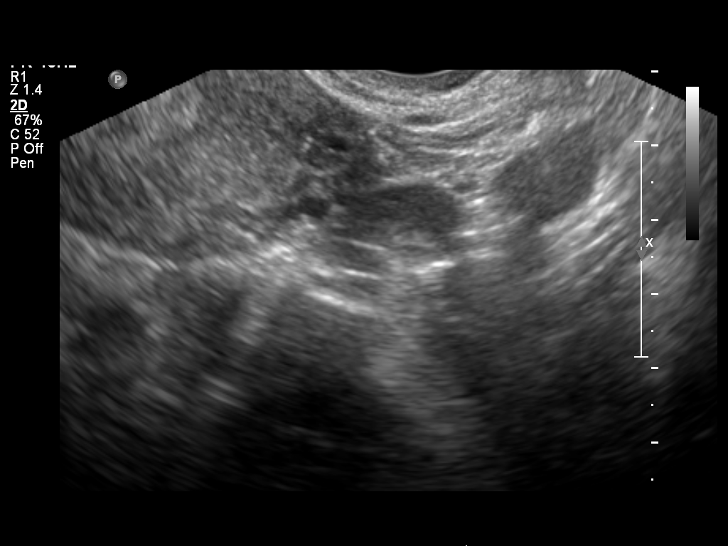
[im 54/54]
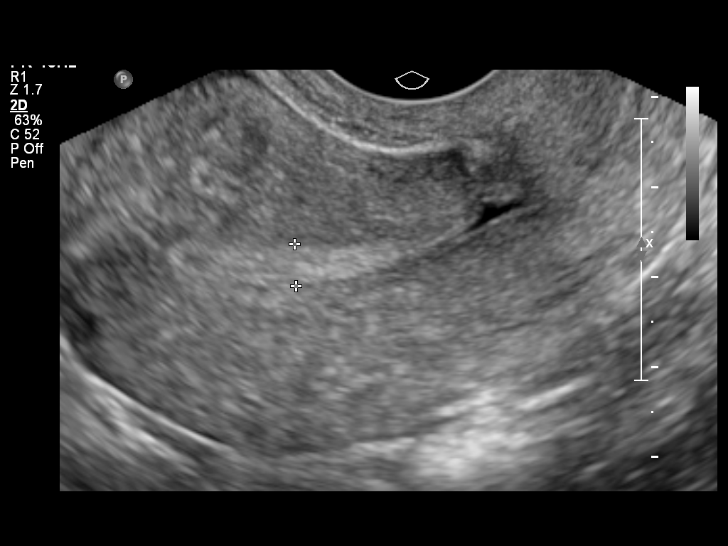

[14 of 25 positions shown; findings below may reference images not displayed]

FINDINGS: Uterus: 9.5 x 4.5 x 5.3 cm.  Tiny intramural leiomyoma at uterine
fundus 11 x 11 x 13 mm without submucosal extension.

Endometrium: 6 mm thick, normal. Tiny amount of fluid at a cesarean
section scar at lower uterine segment anteriorly.

Right ovary:  2.4 x 1.6 x 1.4 cm.  Normal morphology without mass.

Left ovary: 2.1 x 1.3 x 1.0 cm.  Normal morphology without mass.

Other findings: No free pelvic fluid or adnexal masses.
IMPRESSION: Uterine scar post cesarean section with minimal associated fluid.
Tiny leiomyoma at uterine fundus 13 mm greatest size.
Otherwise negative exam..

## 2012-08-26 ENCOUNTER — Encounter: Payer: Self-pay | Admitting: Obstetrics & Gynecology

## 2012-08-26 ENCOUNTER — Ambulatory Visit (INDEPENDENT_AMBULATORY_CARE_PROVIDER_SITE_OTHER): Payer: Medicaid Other | Admitting: Obstetrics & Gynecology

## 2012-08-26 VITALS — BP 116/80 | HR 88 | Temp 98.6°F | Ht 65.0 in | Wt 193.0 lb

## 2012-08-26 DIAGNOSIS — N939 Abnormal uterine and vaginal bleeding, unspecified: Secondary | ICD-10-CM

## 2012-08-26 DIAGNOSIS — N926 Irregular menstruation, unspecified: Secondary | ICD-10-CM

## 2012-08-26 DIAGNOSIS — N949 Unspecified condition associated with female genital organs and menstrual cycle: Secondary | ICD-10-CM

## 2012-08-26 DIAGNOSIS — N9489 Other specified conditions associated with female genital organs and menstrual cycle: Secondary | ICD-10-CM

## 2012-08-26 NOTE — Progress Notes (Signed)
.   Subjective:     Caroline Carlson is a 32 y.o. female here for her ultrasound results form 08/19/12.  Ccurrent complaints - pelvic pain for over one year now.  Personal health questionnaire reviewed: no.   Gynecologic History Patient's last menstrual period was 08/14/2012. Contraception: none Last Pap: 2014. Results were: normal Last mammogram: N/A  Obstetric History OB History  Gravida Para Term Preterm AB SAB TAB Ectopic Multiple Living  3 3 3       3     # Outcome Date GA Lbr Len/2nd Weight Sex Delivery Anes PTL Lv  3 TRM           2 TRM           1 TRM                The following portions of the patient's history were reviewed and updated as appropriate: allergies, current medications, past family history, past medical history, past social history, past surgical history and problem list.  Review of Systems Pertinent items are noted in HPI.    Objective:     No exam today     Assessment:   AUB, pelvic pain U/S w/minimal findings--small myoma; ?C/D scar diverticula  Plan:    Education reviewed: re: robotic-assisted hysterectomy. Return postop

## 2012-08-28 ENCOUNTER — Encounter: Payer: Self-pay | Admitting: Obstetrics & Gynecology

## 2012-08-28 NOTE — Patient Instructions (Signed)
Hysterectomy Information  A hysterectomy is a procedure where your uterus is surgically removed. It will no longer be possible to have menstrual periods or to become pregnant. The tubes and ovaries can be removed (bilateral salpingo-oopherectomy) during this surgery as well.  REASONS FOR A HYSTERECTOMY  Persistent, abnormal bleeding.  Lasting (chronic) pelvic pain or infection.  The lining of the uterus (endometrium) starts growing outside the uterus (endometriosis).  The endometrium starts growing in the muscle of the uterus (adenomyosis).  The uterus falls down into the vagina (pelvic organ prolapse).  Symptomatic uterine fibroids.  Precancerous cells.  Cervical cancer or uterine cancer. TYPES OF HYSTERECTOMIES  Supracervical hysterectomy. This type removes the top part of the uterus, but not the cervix.  Total hysterectomy. This type removes the uterus and cervix.  Radical hysterectomy. This type removes the uterus, cervix, and the fibrous tissue that holds the uterus in place in the pelvis (parametrium). WAYS A HYSTERECTOMY CAN BE PERFORMED  Abdominal hysterectomy. A large surgical cut (incision) is made in the abdomen. The uterus is removed through this incision.  Vaginal hysterectomy. An incision is made in the vagina. The uterus is removed through this incision. There are no abdominal incisions.  Conventional laparoscopic hysterectomy. A thin, lighted tube with a camera (laparoscope) is inserted into 3 or 4 small incisions in the abdomen. The uterus is cut into small pieces. The small pieces are removed through the incisions, or they are removed through the vagina.  Laparoscopic assisted vaginal hysterectomy (LAVH). Three or four small incisions are made in the abdomen. Part of the surgery is performed laparoscopically and part vaginally. The uterus is removed through the vagina.  Robot-assisted laparoscopic hysterectomy. A laparoscope is inserted into 3 or 4 small  incisions in the abdomen. A computer-controlled device is used to give the surgeon a 3D image. This allows for more precise movements of surgical instruments. The uterus is cut into small pieces and removed through the incisions or removed through the vagina. RISKS OF HYSTERECTOMY   Bleeding and risk of blood transfusion. Tell your caregiver if you do not want to receive any blood products.  Blood clots in the legs or lung.  Infection.  Injury to surrounding organs.  Anesthesia problems or side effects.  Conversion to an abdominal hysterectomy. WHAT TO EXPECT AFTER A HYSTERECTOMY  You will be given pain medicine.  You will need to have someone with you for the first 3 to 5 days after you go home.  You will need to follow up with your surgeon in 2 to 4 weeks after surgery to evaluate your progress.  You may have early menopause symptoms like hot flashes, night sweats, and insomnia.  If you had a hysterectomy for a problem that was not a cancer or a condition that could lead to cancer, then you no longer need Pap tests. However, even if you no longer need a Pap test, a regular exam is a good idea to make sure no other problems are starting. Document Released: 06/26/2000 Document Revised: 03/25/2011 Document Reviewed: 08/11/2010 Exit[employer] Patient Information 2014 [employer].  

## 2012-09-01 ENCOUNTER — Other Ambulatory Visit: Payer: Self-pay | Admitting: *Deleted

## 2012-09-15 ENCOUNTER — Encounter (HOSPITAL_COMMUNITY): Payer: Self-pay | Admitting: Pharmacist

## 2012-09-22 NOTE — H&P (Signed)
  Subjective:  Caroline Carlson is a 32 y.o.  female.  I was consulted regarding irregular bleeding.  Onset of symptoms was gradual starting 1 year ago with gradually worsening course since that time. Bleeding is characterized as moderate. Associated symptoms include pelvic pain.  Evaluation to date: pelvic ultrasound: positive for a small myoma and a possible diverticula of the LUS/ C/D scar.   Pertinent Gyn History:  Menses: see above  Last pap: normal Date: 2014  Patient Active Problem List   Diagnosis Date Noted  . Uterine adnexal pain 08/14/2012  . Abnormal uterine bleeding 08/14/2012  . Depression 08/14/2012   Past Medical History  Diagnosis Date  . Mental disorder     Currently depressed    Past Surgical History  Procedure Laterality Date  . Cesarean section    . Tubal ligation      No prescriptions prior to admission   No Known Allergies  History  Substance Use Topics  . Smoking status: Current Some Day Smoker -- 0.20 packs/day for .5 years  . Smokeless tobacco: Not on file  . Alcohol Use: No    Family History  Problem Relation Age of Onset  . Heart disease Other   . Hypertension Other      Review of Systems Pertinent items are noted in HPI.    Objective:    General appearance: alert Breasts: normal appearance, no masses or tenderness Abdomen: soft, non-tender; bowel sounds normal; no masses,  no organomegaly Pelvic: cervix normal in appearance, external genitalia normal, no adnexal masses or tenderness, uterus normal size, shape, and consistency and vagina normal without discharge    Assessment/Plan:  AUB--L, I Pelvic pain  A total robotic-assisted hysterectomy is planned.    I had a lengthy discussion with the patient regarding her bleeding and consideration for hysterectomy.  Procedure, risks, reasons, benefits and complications (including injury to bowel, bladder, major blood vessel, ureter, bleeding, possibility of transfusion, infection,  or fistula formation) were reviewed in detail. Consent was signed and preop testing was ordered.  Instructions were reviewed, including NPO after midnight.

## 2012-09-24 ENCOUNTER — Encounter (HOSPITAL_COMMUNITY): Payer: Self-pay

## 2012-09-24 ENCOUNTER — Encounter (HOSPITAL_COMMUNITY)
Admission: RE | Admit: 2012-09-24 | Discharge: 2012-09-24 | Disposition: A | Discharge status: Home / Self Care | Payer: Medicaid Other | Source: Ambulatory Visit | Attending: Obstetrics & Gynecology | Admitting: Obstetrics & Gynecology

## 2012-09-24 HISTORY — DX: Panic disorder (episodic paroxysmal anxiety): F41.0

## 2012-09-24 HISTORY — DX: Depression, unspecified: F32.A

## 2012-09-24 HISTORY — DX: Other specified postprocedural states: Z98.890

## 2012-09-24 HISTORY — DX: Anxiety disorder, unspecified: F41.9

## 2012-09-24 HISTORY — DX: Other specified postprocedural states: R11.2

## 2012-09-24 HISTORY — DX: Anemia, unspecified: D64.9

## 2012-09-24 HISTORY — DX: Major depressive disorder, single episode, unspecified: F32.9

## 2012-09-24 HISTORY — DX: Personal history of other medical treatment: Z92.89

## 2012-09-24 HISTORY — DX: Hyperlipidemia, unspecified: E78.5

## 2012-09-24 LAB — CBC
HCT: 36 % (ref 36.0–46.0)
Hemoglobin: 12 g/dL (ref 12.0–15.0)
MCHC: 33.3 g/dL (ref 30.0–36.0)
RBC: 4.31 MIL/uL (ref 3.87–5.11)
WBC: 12.7 10*3/uL — ABNORMAL HIGH (ref 4.0–10.5)

## 2012-09-24 LAB — ABO/RH: ABO/RH(D): B NEG

## 2012-09-24 MED ORDER — METRONIDAZOLE IN NACL 5-0.79 MG/ML-% IV SOLN
500.0000 mg | Freq: Once | INTRAVENOUS | Status: AC
  Administered 2012-09-25: 500 mg via INTRAVENOUS
  Filled 2012-09-24: qty 100

## 2012-09-24 NOTE — Pre-Procedure Instructions (Signed)
Patient has history of blood transfusion in 06/2002 at [facility].  SDS BB History Log given to lab.

## 2012-09-24 NOTE — Patient Instructions (Addendum)
   Your procedure is scheduled [id number], 09/25/12  Enter through the Main Entrance of [facility] at: 1030 am Pick up the phone at the desk and dial [phone] and inform us of your arrival.  Please call this number if you have any problems the morning of surgery: [phone]  Remember: Do not eat food after midnight: Thursday Do not drink clear liquids after: 8 am Friday - day of surgery Take these medicines the morning of surgery with a SIP OF WATER:  zoloft  Do not wear jewelry, make-up, or FINGER nail polish No metal in your hair or on your body. Do not wear lotions, powders, perfumes. You may wear deodorant.  Please use your CHG wash as directed prior to surgery.  Do not shave anywhere for at least 12 hours prior to first CHG shower.  Do not bring valuables to the hospital. Contacts, dentures or bridgework may not be worn into surgery.  Leave suitcase in the car. After Surgery it may be brought to your room. For patients being admitted to the hospital, checkout time is 11:00am the day of discharge.  Home with husband Slate  cell [phone].

## 2012-09-25 ENCOUNTER — Encounter (HOSPITAL_COMMUNITY): Payer: Self-pay | Admitting: *Deleted

## 2012-09-25 ENCOUNTER — Ambulatory Visit (HOSPITAL_COMMUNITY): Payer: Medicaid Other | Admitting: Anesthesiology

## 2012-09-25 ENCOUNTER — Encounter (HOSPITAL_COMMUNITY)
Admission: RE | Disposition: A | Discharge status: Home / Self Care | Payer: Self-pay | Source: Ambulatory Visit | Attending: Obstetrics & Gynecology

## 2012-09-25 ENCOUNTER — Encounter (HOSPITAL_COMMUNITY): Payer: Self-pay | Admitting: Anesthesiology

## 2012-09-25 ENCOUNTER — Inpatient Hospital Stay (HOSPITAL_COMMUNITY)
Admission: RE | Admit: 2012-09-25 | Discharge: 2012-09-26 | DRG: 743 | Disposition: A | Discharge status: Home / Self Care | Payer: Medicaid Other | Source: Ambulatory Visit | Attending: Obstetrics & Gynecology | Admitting: Obstetrics & Gynecology

## 2012-09-25 DIAGNOSIS — N926 Irregular menstruation, unspecified: Secondary | ICD-10-CM

## 2012-09-25 DIAGNOSIS — N838 Other noninflammatory disorders of ovary, fallopian tube and broad ligament: Secondary | ICD-10-CM | POA: Diagnosis present

## 2012-09-25 DIAGNOSIS — N949 Unspecified condition associated with female genital organs and menstrual cycle: Principal | ICD-10-CM | POA: Diagnosis present

## 2012-09-25 DIAGNOSIS — D251 Intramural leiomyoma of uterus: Secondary | ICD-10-CM | POA: Diagnosis present

## 2012-09-25 DIAGNOSIS — D259 Leiomyoma of uterus, unspecified: Secondary | ICD-10-CM

## 2012-09-25 DIAGNOSIS — N938 Other specified abnormal uterine and vaginal bleeding: Principal | ICD-10-CM | POA: Diagnosis present

## 2012-09-25 DIAGNOSIS — N939 Abnormal uterine and vaginal bleeding, unspecified: Secondary | ICD-10-CM

## 2012-09-25 DIAGNOSIS — K5730 Diverticulosis of large intestine without perforation or abscess without bleeding: Secondary | ICD-10-CM | POA: Diagnosis present

## 2012-09-25 HISTORY — PX: ROBOTIC ASSISTED TOTAL HYSTERECTOMY: SHX6085

## 2012-09-25 HISTORY — PX: BILATERAL SALPINGECTOMY: SHX5743

## 2012-09-25 SURGERY — ROBOTIC ASSISTED TOTAL HYSTERECTOMY
Anesthesia: General | Site: Abdomen | Wound class: Clean Contaminated

## 2012-09-25 MED ORDER — DEXAMETHASONE SODIUM PHOSPHATE 10 MG/ML IJ SOLN
INTRAMUSCULAR | Status: AC
  Filled 2012-09-25: qty 1

## 2012-09-25 MED ORDER — HYDROMORPHONE HCL PF 1 MG/ML IJ SOLN
0.2000 mg | INTRAMUSCULAR | Status: DC | PRN
  Administered 2012-09-25 (×3): 0.6 mg via INTRAVENOUS
  Filled 2012-09-25 (×3): qty 1

## 2012-09-25 MED ORDER — KETOROLAC TROMETHAMINE 30 MG/ML IJ SOLN: 30.0000 mg | Freq: Once | INTRAMUSCULAR | Status: DC

## 2012-09-25 MED ORDER — PROPOFOL 10 MG/ML IV BOLUS
INTRAVENOUS | Status: DC | PRN
  Administered 2012-09-25: 150 mg via INTRAVENOUS
  Administered 2012-09-25: 50 mg via INTRAVENOUS

## 2012-09-25 MED ORDER — PHENYLEPHRINE HCL 10 MG/ML IJ SOLN
INTRAMUSCULAR | Status: DC | PRN
  Administered 2012-09-25 (×3): 40 ug via INTRAVENOUS

## 2012-09-25 MED ORDER — MAGNESIUM HYDROXIDE 400 MG/5ML PO SUSP
30.0000 mL | Freq: Two times a day (BID) | ORAL | Status: AC
  Administered 2012-09-25 – 2012-09-26 (×2): 30 mL via ORAL
  Filled 2012-09-25 (×2): qty 30

## 2012-09-25 MED ORDER — SERTRALINE HCL 25 MG PO TABS
25.0000 mg | ORAL_TABLET | Freq: Every day | ORAL | Status: DC
  Administered 2012-09-25 – 2012-09-26 (×2): 25 mg via ORAL
  Filled 2012-09-25 (×3): qty 1

## 2012-09-25 MED ORDER — GLYCOPYRROLATE 0.2 MG/ML IJ SOLN
INTRAMUSCULAR | Status: DC | PRN
  Administered 2012-09-25: .6 mg via INTRAVENOUS

## 2012-09-25 MED ORDER — KETOROLAC TROMETHAMINE 30 MG/ML IJ SOLN
30.0000 mg | Freq: Four times a day (QID) | INTRAMUSCULAR | Status: AC
  Administered 2012-09-25 (×2): 30 mg via INTRAVENOUS
  Filled 2012-09-25 (×2): qty 1

## 2012-09-25 MED ORDER — FENTANYL CITRATE 0.05 MG/ML IJ SOLN
25.0000 ug | INTRAMUSCULAR | Status: DC | PRN
  Administered 2012-09-25 (×2): 50 ug via INTRAVENOUS

## 2012-09-25 MED ORDER — NEOSTIGMINE METHYLSULFATE 1 MG/ML IJ SOLN
INTRAMUSCULAR | Status: AC
  Filled 2012-09-25: qty 1

## 2012-09-25 MED ORDER — ROCURONIUM BROMIDE 100 MG/10ML IV SOLN
INTRAVENOUS | Status: DC | PRN
  Administered 2012-09-25 (×3): 10 mg via INTRAVENOUS
  Administered 2012-09-25: 40 mg via INTRAVENOUS

## 2012-09-25 MED ORDER — HYDROMORPHONE HCL PF 1 MG/ML IJ SOLN
INTRAMUSCULAR | Status: AC
  Filled 2012-09-25: qty 1

## 2012-09-25 MED ORDER — ONDANSETRON HCL 4 MG/2ML IJ SOLN
INTRAMUSCULAR | Status: AC
  Filled 2012-09-25: qty 2

## 2012-09-25 MED ORDER — METOCLOPRAMIDE HCL 5 MG/ML IJ SOLN: 10.0000 mg | Freq: Once | INTRAMUSCULAR | Status: DC | PRN

## 2012-09-25 MED ORDER — OXYCODONE-ACETAMINOPHEN 5-325 MG PO TABS
1.0000 | ORAL_TABLET | ORAL | Status: DC | PRN
  Administered 2012-09-25 (×2): 1 via ORAL
  Administered 2012-09-26 (×2): 2 via ORAL
  Filled 2012-09-25: qty 2
  Filled 2012-09-25: qty 1
  Filled 2012-09-25: qty 2
  Filled 2012-09-25: qty 1

## 2012-09-25 MED ORDER — MIDAZOLAM HCL 2 MG/2ML IJ SOLN
INTRAMUSCULAR | Status: AC
  Filled 2012-09-25: qty 2

## 2012-09-25 MED ORDER — PROPOFOL 10 MG/ML IV EMUL
INTRAVENOUS | Status: AC
  Filled 2012-09-25: qty 20

## 2012-09-25 MED ORDER — SODIUM CHLORIDE 0.45 % IV SOLN
INTRAVENOUS | Status: DC
  Administered 2012-09-25 – 2012-09-26 (×2): via INTRAVENOUS

## 2012-09-25 MED ORDER — MEPERIDINE HCL 25 MG/ML IJ SOLN
INTRAMUSCULAR | Status: AC
  Filled 2012-09-25: qty 1

## 2012-09-25 MED ORDER — PANTOPRAZOLE SODIUM 40 MG PO TBEC
40.0000 mg | DELAYED_RELEASE_TABLET | Freq: Every day | ORAL | Status: DC
  Administered 2012-09-25 – 2012-09-26 (×2): 40 mg via ORAL
  Filled 2012-09-25 (×3): qty 1

## 2012-09-25 MED ORDER — KETOROLAC TROMETHAMINE 30 MG/ML IJ SOLN: 15.0000 mg | Freq: Once | INTRAMUSCULAR | Status: DC | PRN

## 2012-09-25 MED ORDER — NEOSTIGMINE METHYLSULFATE 1 MG/ML IJ SOLN
INTRAMUSCULAR | Status: DC | PRN
  Administered 2012-09-25: 4 mg via INTRAVENOUS

## 2012-09-25 MED ORDER — LIDOCAINE HCL (CARDIAC) 20 MG/ML IV SOLN
INTRAVENOUS | Status: DC | PRN
  Administered 2012-09-25: 80 mg via INTRAVENOUS

## 2012-09-25 MED ORDER — BUPIVACAINE HCL (PF) 0.25 % IJ SOLN
INTRAMUSCULAR | Status: AC
  Filled 2012-09-25: qty 30

## 2012-09-25 MED ORDER — BUPIVACAINE HCL (PF) 0.25 % IJ SOLN
INTRAMUSCULAR | Status: DC | PRN
  Administered 2012-09-25: 8 mL

## 2012-09-25 MED ORDER — MIDAZOLAM HCL 2 MG/2ML IJ SOLN
INTRAMUSCULAR | Status: DC | PRN
  Administered 2012-09-25: 2 mg via INTRAVENOUS

## 2012-09-25 MED ORDER — ARTIFICIAL TEARS OP OINT
TOPICAL_OINTMENT | OPHTHALMIC | Status: AC
  Filled 2012-09-25: qty 3.5

## 2012-09-25 MED ORDER — GLYCOPYRROLATE 0.2 MG/ML IJ SOLN
INTRAMUSCULAR | Status: AC
  Filled 2012-09-25: qty 3

## 2012-09-25 MED ORDER — CEFAZOLIN SODIUM-DEXTROSE 2-3 GM-% IV SOLR
INTRAVENOUS | Status: AC
  Filled 2012-09-25: qty 50

## 2012-09-25 MED ORDER — FENTANYL CITRATE 0.05 MG/ML IJ SOLN
INTRAMUSCULAR | Status: AC
  Filled 2012-09-25: qty 5

## 2012-09-25 MED ORDER — ROCURONIUM BROMIDE 50 MG/5ML IV SOLN
INTRAVENOUS | Status: AC
  Filled 2012-09-25: qty 1

## 2012-09-25 MED ORDER — ZOLPIDEM TARTRATE 5 MG PO TABS: 5.0000 mg | ORAL_TABLET | Freq: Every evening | ORAL | Status: DC | PRN

## 2012-09-25 MED ORDER — MEPERIDINE HCL 25 MG/ML IJ SOLN: 12.5000 mg | Freq: Once | INTRAMUSCULAR | Status: DC

## 2012-09-25 MED ORDER — ACETAMINOPHEN 10 MG/ML IV SOLN
1000.0000 mg | Freq: Once | INTRAVENOUS | Status: DC
  Filled 2012-09-25: qty 100

## 2012-09-25 MED ORDER — LACTATED RINGERS IV SOLN
INTRAVENOUS | Status: DC
  Administered 2012-09-25 (×2): via INTRAVENOUS

## 2012-09-25 MED ORDER — LIDOCAINE HCL (CARDIAC) 20 MG/ML IV SOLN
INTRAVENOUS | Status: AC
  Filled 2012-09-25: qty 5

## 2012-09-25 MED ORDER — MENTHOL 3 MG MT LOZG: 1.0000 | LOZENGE | OROMUCOSAL | Status: DC | PRN

## 2012-09-25 MED ORDER — FENTANYL CITRATE 0.05 MG/ML IJ SOLN
INTRAMUSCULAR | Status: DC | PRN
  Administered 2012-09-25 (×5): 50 ug via INTRAVENOUS

## 2012-09-25 MED ORDER — LACTATED RINGERS IR SOLN
Status: DC | PRN
  Administered 2012-09-25: 3000 mL

## 2012-09-25 MED ORDER — ONDANSETRON HCL 4 MG/2ML IJ SOLN: 4.0000 mg | Freq: Four times a day (QID) | INTRAMUSCULAR | Status: DC | PRN

## 2012-09-25 MED ORDER — SCOPOLAMINE 1 MG/3DAYS TD PT72
MEDICATED_PATCH | TRANSDERMAL | Status: AC
  Filled 2012-09-25: qty 1

## 2012-09-25 MED ORDER — ACETAMINOPHEN 10 MG/ML IV SOLN
INTRAVENOUS | Status: DC | PRN
  Administered 2012-09-25: 1000 mg via INTRAVENOUS

## 2012-09-25 MED ORDER — FENTANYL CITRATE 0.05 MG/ML IJ SOLN
INTRAMUSCULAR | Status: AC
  Administered 2012-09-25: 50 ug via INTRAVENOUS
  Filled 2012-09-25: qty 2

## 2012-09-25 MED ORDER — ONDANSETRON HCL 4 MG PO TABS: 4.0000 mg | ORAL_TABLET | Freq: Four times a day (QID) | ORAL | Status: DC | PRN

## 2012-09-25 MED ORDER — CEFAZOLIN SODIUM-DEXTROSE 2-3 GM-% IV SOLR
2.0000 g | INTRAVENOUS | Status: AC
  Administered 2012-09-25: 2 g via INTRAVENOUS

## 2012-09-25 MED ORDER — HYDROMORPHONE HCL PF 1 MG/ML IJ SOLN
INTRAMUSCULAR | Status: DC | PRN
  Administered 2012-09-25: 1 mg via INTRAVENOUS

## 2012-09-25 MED ORDER — DEXAMETHASONE SODIUM PHOSPHATE 4 MG/ML IJ SOLN
INTRAMUSCULAR | Status: DC | PRN
  Administered 2012-09-25: 10 mg via INTRAVENOUS

## 2012-09-25 MED ORDER — SIMETHICONE 80 MG PO CHEW
80.0000 mg | CHEWABLE_TABLET | Freq: Four times a day (QID) | ORAL | Status: DC | PRN
  Administered 2012-09-25 – 2012-09-26 (×3): 80 mg via ORAL

## 2012-09-25 MED ORDER — KETOROLAC TROMETHAMINE 30 MG/ML IJ SOLN: 30.0000 mg | Freq: Four times a day (QID) | INTRAMUSCULAR | Status: AC

## 2012-09-25 MED ORDER — IBUPROFEN 800 MG PO TABS
800.0000 mg | ORAL_TABLET | Freq: Three times a day (TID) | ORAL | Status: DC | PRN
  Administered 2012-09-26 (×2): 800 mg via ORAL
  Filled 2012-09-25 (×2): qty 1

## 2012-09-25 MED ORDER — ONDANSETRON HCL 4 MG/2ML IJ SOLN
INTRAMUSCULAR | Status: DC | PRN
  Administered 2012-09-25: 4 mg via INTRAVENOUS

## 2012-09-25 SURGICAL SUPPLY — 100 items
APL SKNCLS STERI-STRIP NONHPOA (GAUZE/BANDAGES/DRESSINGS) ×2
BAG URINE DRAINAGE (UROLOGICAL SUPPLIES) ×3 IMPLANT
BARRIER ADHS 3X4 INTERCEED (GAUZE/BANDAGES/DRESSINGS) ×3 IMPLANT
BENZOIN TINCTURE PRP APPL 2/3 (GAUZE/BANDAGES/DRESSINGS) ×3 IMPLANT
BRR ADH 4X3 ABS CNTRL BYND (GAUZE/BANDAGES/DRESSINGS) ×2
BRR ADH 6X5 SEPRAFILM 1 SHT (MISCELLANEOUS)
CANISTER SUCTION 2500CC (MISCELLANEOUS) ×3 IMPLANT
CATH FOLEY 3WAY  5CC 16FR (CATHETERS) ×1
CATH FOLEY 3WAY 5CC 16FR (CATHETERS) ×2 IMPLANT
CELLS DAT CNTRL 66122 CELL SVR (MISCELLANEOUS) IMPLANT
CHLORAPREP W/TINT 26ML (MISCELLANEOUS) ×3 IMPLANT
CLIP LIGATING EXTRA MED SLVR (CLIP) IMPLANT
CLOTH BEACON ORANGE TIMEOUT ST (SAFETY) ×3 IMPLANT
CONT PATH 16OZ SNAP LID 3702 (MISCELLANEOUS) ×3 IMPLANT
CORDS BIPOLAR (ELECTRODE) ×3 IMPLANT
COVER MAYO STAND STRL (DRAPES) ×3 IMPLANT
COVER TABLE BACK 60X90 (DRAPES) ×6 IMPLANT
COVER TIP SHEARS 8 DVNC (MISCELLANEOUS) ×2 IMPLANT
COVER TIP SHEARS 8MM DA VINCI (MISCELLANEOUS) ×1
DECANTER SPIKE VIAL GLASS SM (MISCELLANEOUS) ×3 IMPLANT
DRAPE HUG U DISPOSABLE (DRAPE) ×3 IMPLANT
DRAPE LG THREE QUARTER DISP (DRAPES) ×6 IMPLANT
DRAPE UNDERBUTTOCKS STRL (DRAPE) ×1 IMPLANT
DRAPE WARM FLUID 44X44 (DRAPE) ×3 IMPLANT
DRESSING TELFA 8X3 (GAUZE/BANDAGES/DRESSINGS) ×3 IMPLANT
ELECT BLADE 6 FLAT ULTRCLN (ELECTRODE) IMPLANT
ELECT REM PT RETURN 9FT ADLT (ELECTROSURGICAL) ×3
ELECTRODE REM PT RTRN 9FT ADLT (ELECTROSURGICAL) ×2 IMPLANT
EVACUATOR SMOKE 8.L (FILTER) ×3 IMPLANT
GAUZE SPONGE 4X4 12PLY STRL LF (GAUZE/BANDAGES/DRESSINGS) ×3 IMPLANT
GAUZE SPONGE 4X4 16PLY XRAY LF (GAUZE/BANDAGES/DRESSINGS) ×1 IMPLANT
GAUZE VASELINE 3X9 (GAUZE/BANDAGES/DRESSINGS) IMPLANT
GLOVE BIO SURGEON STRL SZ 6.5 (GLOVE) ×6 IMPLANT
GLOVE BIO SURGEON STRL SZ8 (GLOVE) ×6 IMPLANT
GLOVE BIOGEL PI IND STRL 7.0 (GLOVE) ×4 IMPLANT
GLOVE BIOGEL PI INDICATOR 7.0 (GLOVE) ×2
GLOVE ECLIPSE 6.5 STRL STRAW (GLOVE) ×12 IMPLANT
GOWN PREVENTION PLUS LG XLONG (DISPOSABLE) ×9 IMPLANT
GOWN STRL REIN XL XLG (GOWN DISPOSABLE) ×18 IMPLANT
KIT ACCESSORY DA VINCI DISP (KITS) ×1
KIT ACCESSORY DVNC DISP (KITS) ×2 IMPLANT
LEGGING LITHOTOMY PAIR STRL (DRAPES) ×3 IMPLANT
MANIPULATOR UTERINE 4.5 ZUMI (MISCELLANEOUS) ×2 IMPLANT
NDL HYPO 25X1 1.5 SAFETY (NEEDLE) IMPLANT
NEEDLE HYPO 25X1 1.5 SAFETY (NEEDLE) IMPLANT
NEEDLE INSUFFLATION 120MM (ENDOMECHANICALS) ×3 IMPLANT
NS IRRIG 1000ML POUR BTL (IV SOLUTION) ×1 IMPLANT
OCCLUDER COLPOPNEUMO (BALLOONS) ×3 IMPLANT
PACK ABDOMINAL GYN (CUSTOM PROCEDURE TRAY) ×1 IMPLANT
PACK LAVH (CUSTOM PROCEDURE TRAY) ×3 IMPLANT
PAD ABD 7.5X8 STRL (GAUZE/BANDAGES/DRESSINGS) ×3 IMPLANT
PAD MAGNETIC INST (MISCELLANEOUS) ×1 IMPLANT
PAD OB MATERNITY 4.3X12.25 (PERSONAL CARE ITEMS) ×3 IMPLANT
PAD PREP 24X48 CUFFED NSTRL (MISCELLANEOUS) ×6 IMPLANT
PLUG CATH AND CAP STER (CATHETERS) ×3 IMPLANT
PROTECTOR NERVE ULNAR (MISCELLANEOUS) ×6 IMPLANT
RETRACTOR WND ALEXIS 18 MED (MISCELLANEOUS) IMPLANT
RETRACTOR WND ALEXIS 25 LRG (MISCELLANEOUS) IMPLANT
RTRCTR WOUND ALEXIS 18CM MED (MISCELLANEOUS)
RTRCTR WOUND ALEXIS 25CM LRG (MISCELLANEOUS)
SCRUB PCMX 4 OZ (MISCELLANEOUS) ×3 IMPLANT
SEPRAFILM MEMBRANE 5X6 (MISCELLANEOUS) IMPLANT
SET CYSTO W/LG BORE CLAMP LF (SET/KITS/TRAYS/PACK) IMPLANT
SET IRRIG TUBING LAPAROSCOPIC (IRRIGATION / IRRIGATOR) ×3 IMPLANT
SHEET LAVH (DRAPES) ×1 IMPLANT
SOLUTION ELECTROLUBE (MISCELLANEOUS) ×3 IMPLANT
SPONGE LAP 18X18 X RAY DECT (DISPOSABLE) ×2 IMPLANT
STAPLER VISISTAT 35W (STAPLE) IMPLANT
STRIP CLOSURE SKIN 1/2X4 (GAUZE/BANDAGES/DRESSINGS) IMPLANT
STRIP CLOSURE SKIN 1/4X4 (GAUZE/BANDAGES/DRESSINGS) ×3 IMPLANT
SUT MNCRL AB 3-0 PS2 27 (SUTURE) IMPLANT
SUT MON AB 2-0 CT1 27 (SUTURE) ×1 IMPLANT
SUT MON AB 3-0 SH 27 (SUTURE)
SUT MON AB 3-0 SH27 (SUTURE) IMPLANT
SUT PDS AB 0 CT1 27 (SUTURE) ×2 IMPLANT
SUT PLAIN 2 0 XLH (SUTURE) IMPLANT
SUT VIC AB 0 CT1 27 (SUTURE) ×12
SUT VIC AB 0 CT1 27XBRD ANTBC (SUTURE) ×8 IMPLANT
SUT VIC AB 0 CT1 27XCR 8 STRN (SUTURE) ×3 IMPLANT
SUT VICRYL 0 TIES 12 18 (SUTURE) ×1 IMPLANT
SUT VICRYL 0 UR6 27IN ABS (SUTURE) ×3 IMPLANT
SYR 50ML LL SCALE MARK (SYRINGE) ×3 IMPLANT
SYR CONTROL 10ML LL (SYRINGE) IMPLANT
SYSTEM CONVERTIBLE TROCAR (TROCAR) IMPLANT
TIP RUMI ORANGE 6.7MMX12CM (TIP) IMPLANT
TIP UTERINE 5.1X6CM LAV DISP (MISCELLANEOUS) IMPLANT
TIP UTERINE 6.7X10CM GRN DISP (MISCELLANEOUS) IMPLANT
TIP UTERINE 6.7X6CM WHT DISP (MISCELLANEOUS) IMPLANT
TIP UTERINE 6.7X8CM BLUE DISP (MISCELLANEOUS) IMPLANT
TOWEL OR 17X24 6PK STRL BLUE (TOWEL DISPOSABLE) ×9 IMPLANT
TRAY FOLEY CATH 14FR (SET/KITS/TRAYS/PACK) ×3 IMPLANT
TROCAR 12M 150ML BLUNT (TROCAR) ×3 IMPLANT
TROCAR DILATING TIP 12MM 150MM (ENDOMECHANICALS) ×3 IMPLANT
TROCAR DISP BLADELESS 8 DVNC (TROCAR) ×2 IMPLANT
TROCAR DISP BLADELESS 8MM (TROCAR) ×1
TROCAR XCEL 12X100 BLDLESS (ENDOMECHANICALS) ×3 IMPLANT
TROCAR XCEL NON-BLD 5MMX100MML (ENDOMECHANICALS) ×3 IMPLANT
TUBING FILTER THERMOFLATOR (ELECTROSURGICAL) ×3 IMPLANT
WARMER LAPAROSCOPE (MISCELLANEOUS) ×3 IMPLANT
WATER STERILE IRR 1000ML POUR (IV SOLUTION) ×9 IMPLANT

## 2012-09-25 NOTE — Interval H&P Note (Signed)
History and Physical Interval Note:  09/25/2012 11:10 AM  Caroline Carlson  has presented today for surgery, with the diagnosis of Questionable C- section scar diverticula/ fibroids/ AUB/pelvic pain  The various methods of treatment have been discussed with the patient and family. After consideration of risks, benefits and other options for treatment, the patient has consented to  Procedure(s) with comments: ROBOTIC ASSISTED TOTAL HYSTERECTOMY (N/A) - possible TAH BILATERAL SALPINGECTOMY (N/A) HYSTERECTOMY ABDOMINAL (N/A) as a surgical intervention .  The patient's history has been reviewed, patient examined, no change in status, stable for surgery.  I have reviewed the patient's chart and labs.  Questions were answered to the patient's satisfaction.     JACKSON-MOORE,F[place] A

## 2012-09-25 NOTE — Anesthesia Postprocedure Evaluation (Signed)
Anesthesia Post Note  Patient: Caroline Carlson  Procedure(s) Performed: Procedure(s) (LRB): ROBOTIC ASSISTED TOTAL HYSTERECTOMY (N/A) BILATERAL SALPINGECTOMY (N/A)  Anesthesia type: General  Patient location: Women's Unit  Post pain: Pain level controlled  Post assessment: Post-op Vital signs reviewed  Last Vitals:  Filed Vitals:   09/25/12 1700  BP: 114/71  Pulse: 80  Temp: 36.8 C  Resp: 20    Post vital signs: Reviewed  Level of consciousness: sedated  Complications: No apparent anesthesia complications

## 2012-09-25 NOTE — Transfer of Care (Signed)
Immediate Anesthesia Transfer of Care Note  Patient: Caroline Carlson  Procedure(s) Performed: Procedure(s): ROBOTIC ASSISTED TOTAL HYSTERECTOMY (N/A) BILATERAL SALPINGECTOMY (N/A)  Patient Location: PACU  Anesthesia Type:General  Level of Consciousness: awake, alert  and oriented  Airway & Oxygen Therapy: Patient Spontanous Breathing and Patient connected to nasal cannula oxygen  Post-op Assessment: Report given to PACU RN and Post -op Vital signs reviewed and stable  Post vital signs: Reviewed and stable  Complications: No apparent anesthesia complications

## 2012-09-25 NOTE — Anesthesia Preprocedure Evaluation (Addendum)
Anesthesia Evaluation  Patient identified by MRN, date of birth, ID band Patient awake    Reviewed: Allergy & Precautions, H&P , NPO status , Patient's Chart, lab work & pertinent test results, reviewed documented beta blocker date and time   History of Anesthesia Complications (+) PONV  Airway Mallampati: I TM Distance: >3 FB Neck ROM: full    Dental no notable dental hx. (+) Teeth Intact   Pulmonary Current Smoker,  breath sounds clear to auscultation  Pulmonary exam normal       Cardiovascular Rhythm:regular Rate:Normal  hyperlipidemia   Neuro/Psych PSYCHIATRIC DISORDERS (anxiety, depression, panic attacks) negative neurological ROS     GI/Hepatic negative GI ROS, Neg liver ROS,   Endo/Other  negative endocrine ROS  Renal/GU negative Renal ROS  Female GU complaint     Musculoskeletal   Abdominal Normal abdominal exam  (+)   Peds  Hematology H/o blood transfusion   Anesthesia Other Findings   Reproductive/Obstetrics negative OB ROS                          Anesthesia Physical Anesthesia Plan  ASA: II  Anesthesia Plan: General ETT   Post-op Pain Management:    Induction:   Airway Management Planned:   Additional Equipment:   Intra-op Plan:   Post-operative Plan:   Informed Consent: I have reviewed the patients History and Physical, chart, labs and discussed the procedure including the risks, benefits and alternatives for the proposed anesthesia with the patient or authorized representative who has indicated his/her understanding and acceptance.   Dental Advisory Given  Plan Discussed with: CRNA and Surgeon  Anesthesia Plan Comments:         Anesthesia Quick Evaluation

## 2012-09-25 NOTE — Op Note (Signed)
Pre-operative Diagnosis: abnormal uterine bleeding, fibroids and possible cesarean delivery scar diverticulum  Post-operative Diagnosis: same  Operation: Robotic-assisted hysterectomy with bilateral salpingectomy  Surgeon: Roseanna Rainbow  Assistant: Coral Ceo, MD  Anesthesia: GET  Urine Output: per Anesthesiolgy  Findings: Slightly enlarged uterus.  Attenuated midisthmic portions of the fallopian tubes.  Normal ovaries.  Adhesions of the bladder to the lower uterine segment  Estimated Blood Loss:  less than 100 mL                 Total IV Fluids: per Anesthesiology         Specimens: PATHOLOGY               Complications:  None; patient tolerated the procedure well.         Disposition: PACU - hemodynamically stable.         Condition: Stable    Procedure Details  The patient was seen in the Holding Room. The risks, benefits, complications, treatment options, and expected outcomes were discussed with the patient.  The patient concurred with the proposed plan, giving informed consent.  The site of surgery properly noted/marked. The patient was identified as Caroline Carlson and the procedure verified as a Robotic-assisted hysterectomy with bilateral salpingectomy. A Time Out was held and the above information confirmed.  After induction of anesthesia, the patient was draped and prepped in the usual sterile manner. Pt was placed in supine position after anesthesia and draped and prepped in the usual sterile manner. The abdominal drape was placed after the CholoraPrep had been allowed to dry for 3 minutes.  Her arms were tucked to her side with all appropriate precautions.   The patient was placed in the semi-lithotomy position in [place] stirrups.  The perineum was prepped with Betadine.  Foley catheter was placed.  A sterile speculum was placed in the vagina.  The cervix was grasped with a single-tooth tenaculum and dilated with Shawnie Pons dilators.  The ZUMI uterine  manipulator with a medium colpotomizer ring was placed without difficulty.  A pneum occluder balloon was placed over the manipulator.   OG tube placement was confirmed and to suction.  Approximately 2 cm below the costal margin, in the midclavicular line the skin was anesthestized with 0.25% Marcaine.  A 5 mm incision was made and using a 5 mm Optiview, a 5 mm trocar was placed under direct vision.  The patient's abdomen was insufflated with CO2 gas.  At this point and all points during the procedure, the patient's intra-abdominal pressure did not exceed 15 mmHg.  A 10-12 camera port was place 24 cm above the pubic symphysis.  Bilateral 8 mm ports were place 10 cm and 15 degrees inferior.  All ports were placed under direct visualization.  The 5 mm port was removed.  The incision was extended to accommodate a 10 mm trocar.  A 10 mm trocar and sleeve were advanced under direct visualization.   The robot was docked in the usual fashion.  The round ligament on the right was transected with monopolar cautery.  The anterior and posterior leaves of the broad ligament were opened.  The ureter was identified.  A window was made between the infundibulopelvic ligament and the ureter.  The right utero-ovarian ligament and proximal fallopian tube were was coagulated with bipolar cautery and transected.  The uterine vessels were skeletonized to the level of the Koh ring. The uterine vessels were coagulated with bipolar cautery and transected.   A C-loop was  created in the usual fashion.  A similar procedure was performed on the patient's left side. The round ligament on the left was transected with monopolar cautery.  The anterior and posterior leaves of the broad ligament were opened.  The ureter was identified.  A window was made between the infundibulopelvic ligament and the ureter.  The left utero-ovarian ligament and proximal fallopian tube were was coagulated with bipolar cautery and transected.  The uterine vessels were  skeletonized to the level of the Koh ring. The uterine vessels were coagulated with bipolar cautery and transected.  A C-loop was created in the usual fashion.   The bladder flap was completed.  At this time, the pneum occluder balloon was insufflated and a colpotomy was performed.  The uterus and cervix were delivered through the vagina. The right mesosalpinx was coagulated with bipolar cautery and transected.  The fallopian tube was removed from the abdomen through the assistant port.  The left fallopian tube was manipulated in a similar fashion.   All pedicles were inspected under low intraabdominal pressures and noted to be hemostatic.  The vagina cuff was closed using 0-Vicryl on a CT-1 needle in a running fashion.  The abdomen and pelvis were copiously irrigated.  The needle was removed without difficulty.  The instruments were then removed under direct visualization and the robotic ports removed.  The robot was undocked.  Deep, subcutaneous, figure-of-eight 0-Vicryl sutures on a UR-6 needle were placed in the 10-12 mm supraumbilical and infracostal incisions.  All skin incisions were closed in a subcuticular fashion using 3-0 Monocryl.  Steri-strips and Benzoin were applied.  The vagina was swabbed with minimal bleeding noted.   All instrument and needle counts were correct x  2.

## 2012-09-25 NOTE — Anesthesia Postprocedure Evaluation (Signed)
  Anesthesia Post Note  Patient: Caroline Carlson  Procedure(s) Performed: Procedure(s) (LRB): ROBOTIC ASSISTED TOTAL HYSTERECTOMY (N/A) BILATERAL SALPINGECTOMY (N/A)  Anesthesia type: GA  Patient location: PACU  Post pain: Pain level controlled  Post assessment: Post-op Vital signs reviewed  Last Vitals:  Filed Vitals:   09/25/12 1030  BP: 113/65  Pulse: 77  Temp: 37.2 C  Resp: 16    Post vital signs: Reviewed  Level of consciousness: sedated  Complications: No apparent anesthesia complications

## 2012-09-26 LAB — CBC
Platelets: 305 10*3/uL (ref 150–400)
RBC: 3.99 MIL/uL (ref 3.87–5.11)
WBC: 19.9 10*3/uL — ABNORMAL HIGH (ref 4.0–10.5)

## 2012-09-26 LAB — COMPREHENSIVE METABOLIC PANEL
ALT: 11 U/L (ref 0–35)
AST: 16 U/L (ref 0–37)
Albumin: 3.4 g/dL — ABNORMAL LOW (ref 3.5–5.2)
CO2: 26 mEq/L (ref 19–32)
Chloride: 100 mEq/L (ref 96–112)
GFR calc non Af Amer: >90 mL/min (ref >90)
Potassium: 4.2 mEq/L (ref 3.5–5.1)
Sodium: 136 mEq/L (ref 135–145)
Total Bilirubin: 0.2 mg/dL — ABNORMAL LOW (ref 0.3–1.2)

## 2012-09-26 MED ORDER — OXYCODONE-ACETAMINOPHEN 5-325 MG PO TABS: 1.0000 | ORAL_TABLET | ORAL | Status: DC | PRN

## 2012-09-26 MED ORDER — IBUPROFEN 600 MG PO TABS: 600.0000 mg | ORAL_TABLET | Freq: Four times a day (QID) | ORAL | Status: DC | PRN

## 2012-09-26 NOTE — Discharge Summary (Signed)
Physician Discharge Summary  Patient ID: Caroline Carlson MRN: [id number] DOB/AGE: 10-06-1980 32 y.o.  Admit date: 09/25/2012 Discharge date: 09/26/2012  Admission Diagnoses: AUB  Discharge Diagnoses: Same Active Problems:   * No active hospital problems. *   Discharged Condition: good  Hospital Course: Underwent Robotic Assisted TLH without complications.  Post op course uncomplicated.  Discharged home in good condition.  Consults: None  Significant Diagnostic Studies: labs: CBC, CMET  Treatments: IV hydration and analgesia: Dilaudid  Discharge Exam: Blood pressure 105/69, pulse 64, temperature 97.5 F (36.4 C), temperature source Oral, resp. rate 18, height 5\' 5"  (1.651 m), weight 193 lb (87.544 kg), last menstrual period 09/11/2012, SpO2 98.00%. General appearance: alert and no distress Resp: clear to auscultation bilaterally Cardio: regular rate and rhythm, S1, S2 normal, no murmur, click, rub or gallop GI: normal findings: soft, non-tender Extremities: extremities normal, atraumatic, no cyanosis or edema  Disposition: 01-Home or Self Care  Discharge Orders   Future Orders Complete By Expires   Call MD for:  difficulty breathing, headache or visual disturbances  As directed    Call MD for:  extreme fatigue  As directed    Call MD for:  hives  As directed    Call MD for:  persistant dizziness or light-headedness  As directed    Call MD for:  persistant nausea and vomiting  As directed    Call MD for:  redness, tenderness, or signs of infection (pain, swelling, redness, odor or green/yellow discharge around incision site)  As directed    Call MD for:  severe uncontrolled pain  As directed    Call MD for:  temperature >100.4  As directed    Call MD for:  As directed    Diet - low sodium heart healthy  As directed    Discharge instructions  As directed    Comments:     Routine   Discharge wound care:  As directed    Comments:     Keep incisions clean and dry.    Driving Restrictions  As directed    Comments:     No driving for 2 weeks.   Increase activity slowly  As directed    Lifting restrictions  As directed    Comments:     No lifting > 20 lbs.   Sexual Activity Restrictions  As directed    Comments:     No sex for 8 weeks.       Medication List         ibuprofen 600 MG tablet  Commonly known as:  ADVIL,MOTRIN  Take 1 tablet (600 mg total) by mouth every 6 (six) hours as needed for pain.     multivitamin with minerals Tabs tablet  Take 1 tablet by mouth daily.     oxyCODONE-acetaminophen 5-325 MG per tablet  Commonly known as:  PERCOCET/ROXICET  Take 1-2 tablets by mouth every 4 (four) hours as needed.     sertraline 50 MG tablet  Commonly known as:  ZOLOFT  Take 25 mg by mouth daily.           Follow-up Information   Follow up with Antionette Char A, MD. Schedule an appointment as soon as possible for a visit in 2 weeks.   Specialty:  Obstetrics and Gynecology   Contact information:   [address] [place] [address] [phone]       Signed: Brock Bad 09/26/2012, 8:25 AM

## 2012-09-26 NOTE — Progress Notes (Signed)
Discharge instructions reviewed with patient.  Patient states understanding of home care, medications, activity, signs/symptoms to report to MD and return MD office visit.  No home equipment needed.  Patient discharged via wheelchair in stable condition with staff without incident. 

## 2012-09-26 NOTE — Progress Notes (Signed)
1 Day Post-Op Procedure(s) (LRB): ROBOTIC ASSISTED TOTAL HYSTERECTOMY (N/A) BILATERAL SALPINGECTOMY (N/A)  Subjective: Patient reports tolerating PO, + flatus and no problems voiding.    Objective: I have reviewed patient's vital signs, intake and output, medications and labs.  General: alert and no distress GI: normal findings: soft, non-tender Extremities: extremities normal, atraumatic, no cyanosis or edema Vaginal Bleeding: none  Assessment: s/p Procedure(s): ROBOTIC ASSISTED TOTAL HYSTERECTOMY (N/A) BILATERAL SALPINGECTOMY (N/A): stable, progressing well, tolerating diet and discharge home.  Plan: Discharge home  LOS: 1 day    Caroline Carlson A 09/26/2012, 8:15 AM

## 2012-09-28 ENCOUNTER — Encounter (HOSPITAL_COMMUNITY): Payer: Self-pay | Admitting: Obstetrics & Gynecology

## 2012-10-12 ENCOUNTER — Encounter: Payer: Self-pay | Admitting: Obstetrics & Gynecology

## 2012-10-12 ENCOUNTER — Ambulatory Visit (INDEPENDENT_AMBULATORY_CARE_PROVIDER_SITE_OTHER): Payer: Medicaid Other | Admitting: Obstetrics & Gynecology

## 2012-10-12 VITALS — BP 101/62 | HR 64 | Temp 98.2°F | Ht 65.0 in | Wt 187.0 lb

## 2012-10-12 DIAGNOSIS — Z23 Encounter for immunization: Secondary | ICD-10-CM

## 2012-10-12 DIAGNOSIS — Z09 Encounter for follow-up examination after completed treatment for conditions other than malignant neoplasm: Secondary | ICD-10-CM

## 2012-10-12 NOTE — Progress Notes (Signed)
.   Subjective:     Caroline Carlson is a 32 y.o. female who presents to the clinic 2 weeks status post Robotic hysterectomy for abnormal uterine bleeding. Eating a regular diet without difficulty. Bowel movements are normal. Pain is controlled without any medications.  The following portions of the patient's history were reviewed and updated as appropriate: allergies, current medications, past family history, past medical history, past social history, past surgical history and problem list.  Review of Systems Pertinent items are noted in HPI.    Objective:    BP 101/62  Pulse 64  Temp(Src) 98.2 F (36.8 C) (Oral)  Ht 5\' 5"  (1.651 m)  Wt 187 lb (84.823 kg)  BMI 31.12 kg/m2  LMP 09/11/2012 General:  alert  Abdomen: soft, bowel sounds active, non-tender  Incision:   healing well, no drainage, no erythema, no hernia, no seroma, no swelling, no dehiscence, incision well approximated     Assessment:    Doing well postoperatively. Operative findings again reviewed. Pathology report discussed.    Plan:    1. Continue any current medications. 2. Wound care discussed. 3. Activity restrictions: no lifting more than 5 pounds and no intercourse 4. Anticipated return to work: 2-3 weeks. 5. Follow up: 4 weeks.

## 2012-10-13 ENCOUNTER — Encounter: Payer: Self-pay | Admitting: Obstetrics & Gynecology

## 2012-10-13 ENCOUNTER — Other Ambulatory Visit: Payer: Self-pay | Admitting: *Deleted

## 2012-10-13 MED ORDER — FLUCONAZOLE 150 MG PO TABS: 150.0000 mg | ORAL_TABLET | ORAL | Status: DC

## 2012-11-09 ENCOUNTER — Ambulatory Visit (INDEPENDENT_AMBULATORY_CARE_PROVIDER_SITE_OTHER): Payer: Medicaid Other | Admitting: Obstetrics & Gynecology

## 2012-11-09 ENCOUNTER — Encounter: Payer: Self-pay | Admitting: Obstetrics & Gynecology

## 2012-11-09 VITALS — BP 114/81 | HR 74 | Temp 97.5°F | Ht 65.0 in | Wt 190.0 lb

## 2012-11-09 DIAGNOSIS — G8918 Other acute postprocedural pain: Secondary | ICD-10-CM

## 2012-11-09 LAB — CBC
MCH: 28.5 pg (ref 26.0–34.0)
MCHC: 34.3 g/dL (ref 30.0–36.0)
MCV: 83 fL (ref 78.0–100.0)
Platelets: 354 10*3/uL (ref 150–400)

## 2012-11-09 LAB — COMPLETE METABOLIC PANEL WITH GFR
Alkaline Phosphatase: 78 U/L (ref 39–117)
Creat: 0.72 mg/dL (ref 0.50–1.10)
GFR, Est African American: >89 mL/min
GFR, Est Non African American: >89 mL/min
Glucose, Bld: 102 mg/dL — ABNORMAL HIGH (ref 70–99)
Sodium: 138 mEq/L (ref 135–145)
Total Bilirubin: 0.3 mg/dL (ref 0.3–1.2)
Total Protein: 7.3 g/dL (ref 6.0–8.3)

## 2012-11-09 MED ORDER — OXYCODONE-ACETAMINOPHEN 5-300 MG PO TABS: 2.0000 | ORAL_TABLET | Freq: Four times a day (QID) | ORAL | Status: DC | PRN
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
## 2012-11-09 NOTE — Patient Instructions (Signed)

## 2012-11-10 ENCOUNTER — Emergency Department (HOSPITAL_BASED_OUTPATIENT_CLINIC_OR_DEPARTMENT_OTHER)
Admission: EM | Admit: 2012-11-10 | Discharge: 2012-11-10 | Disposition: A | Discharge status: Home / Self Care | Payer: Medicaid Other | Attending: Emergency Medicine | Admitting: Emergency Medicine

## 2012-11-10 ENCOUNTER — Emergency Department (HOSPITAL_BASED_OUTPATIENT_CLINIC_OR_DEPARTMENT_OTHER): Payer: Medicaid Other

## 2012-11-10 ENCOUNTER — Encounter (HOSPITAL_BASED_OUTPATIENT_CLINIC_OR_DEPARTMENT_OTHER): Payer: Self-pay | Admitting: Emergency Medicine

## 2012-11-10 DIAGNOSIS — M545 Low back pain, unspecified: Secondary | ICD-10-CM | POA: Insufficient documentation

## 2012-11-10 DIAGNOSIS — R112 Nausea with vomiting, unspecified: Secondary | ICD-10-CM | POA: Insufficient documentation

## 2012-11-10 DIAGNOSIS — F17200 Nicotine dependence, unspecified, uncomplicated: Secondary | ICD-10-CM | POA: Insufficient documentation

## 2012-11-10 DIAGNOSIS — F3289 Other specified depressive episodes: Secondary | ICD-10-CM | POA: Insufficient documentation

## 2012-11-10 DIAGNOSIS — Z79899 Other long term (current) drug therapy: Secondary | ICD-10-CM | POA: Insufficient documentation

## 2012-11-10 DIAGNOSIS — R109 Unspecified abdominal pain: Secondary | ICD-10-CM

## 2012-11-10 DIAGNOSIS — F329 Major depressive disorder, single episode, unspecified: Secondary | ICD-10-CM | POA: Insufficient documentation

## 2012-11-10 DIAGNOSIS — R1032 Left lower quadrant pain: Secondary | ICD-10-CM | POA: Insufficient documentation

## 2012-11-10 DIAGNOSIS — Z862 Personal history of diseases of the blood and blood-forming organs and certain disorders involving the immune mechanism: Secondary | ICD-10-CM | POA: Insufficient documentation

## 2012-11-10 DIAGNOSIS — Z8639 Personal history of other endocrine, nutritional and metabolic disease: Secondary | ICD-10-CM | POA: Insufficient documentation

## 2012-11-10 DIAGNOSIS — F410 Panic disorder [episodic paroxysmal anxiety] without agoraphobia: Secondary | ICD-10-CM | POA: Insufficient documentation

## 2012-11-10 LAB — URINALYSIS, ROUTINE W REFLEX MICROSCOPIC
Bilirubin Urine: NEGATIVE
Leukocytes, UA: NEGATIVE
Nitrite: NEGATIVE
Specific Gravity, Urine: 1.03 (ref 1.005–1.030)
Urobilinogen, UA: 0.2 mg/dL (ref 0.0–1.0)
pH: 5.5 (ref 5.0–8.0)

## 2012-11-10 LAB — URINE MICROSCOPIC-ADD ON

## 2012-11-10 IMAGING — CT CT ABD-PELV W/O CM
2 of 4 series · 16 of 46 positions shown, 18 images · non-contrast
Comparison: [DATE]

CLINICAL DATA: Left flank and back pain, hematuria, recent
hysterectomy next the [DATE]

EXAM:
CT ABDOMEN AND PELVIS WITHOUT CONTRAST
TECHNIQUE: Multidetector CT imaging of the abdomen and pelvis was performed
following the standard protocol without intravenous contrast.

[Series 2: renal stone < 200 lbs 5.0 b31f · axial · 0.79mm/px · z∈[+864,+1249]mm · 13 of 85 slices shown, 15 images]
[im 4/85  soft-tissue]
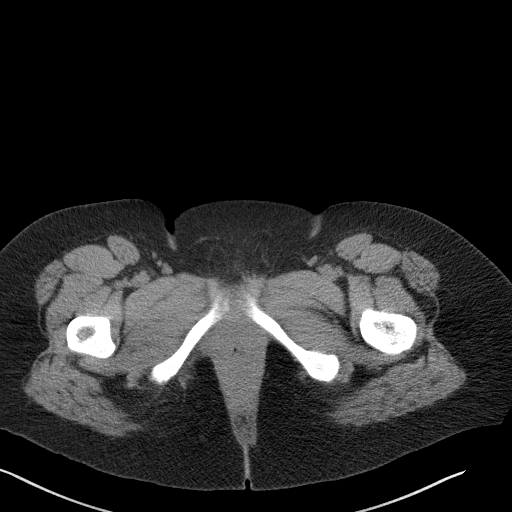
[im 4/85  bone]
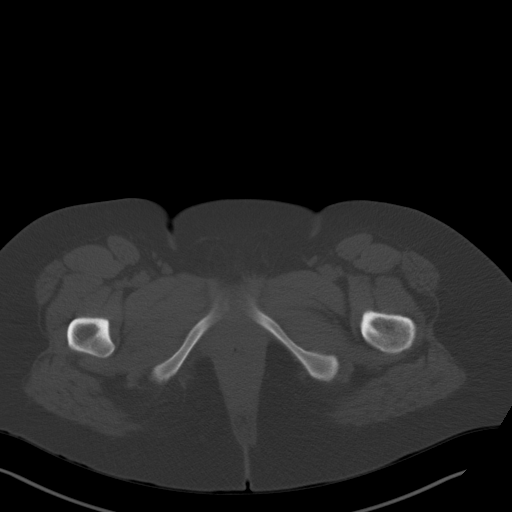
[im 11/85  soft-tissue]
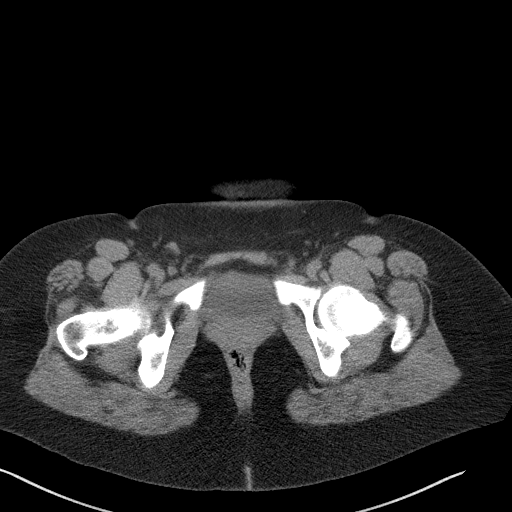
[im 18/85  soft-tissue]
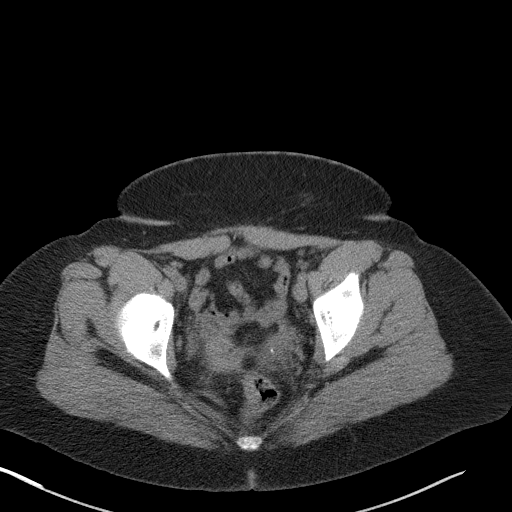
[im 25/85  soft-tissue]
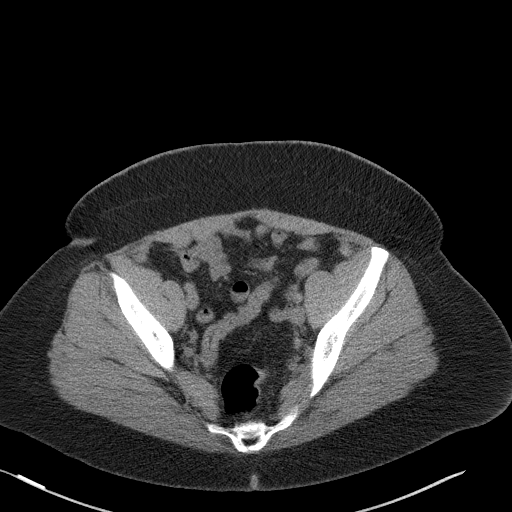
[im 29/85  soft-tissue]
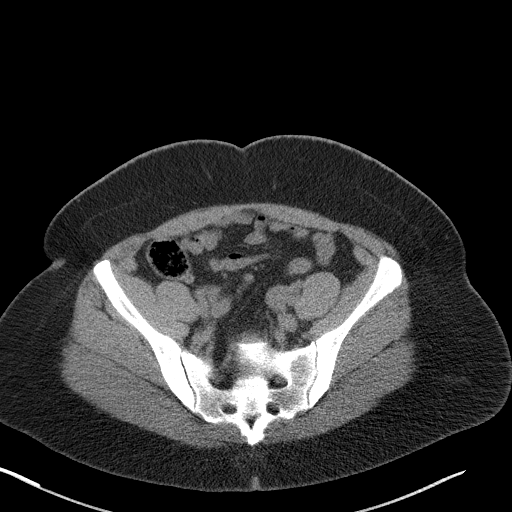
[im 36/85  soft-tissue]
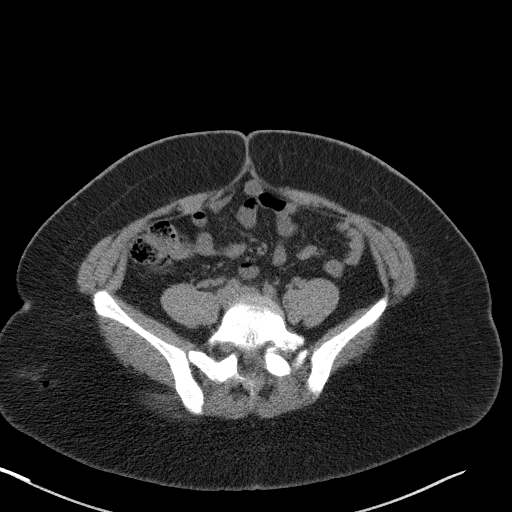
[im 43/85  soft-tissue]
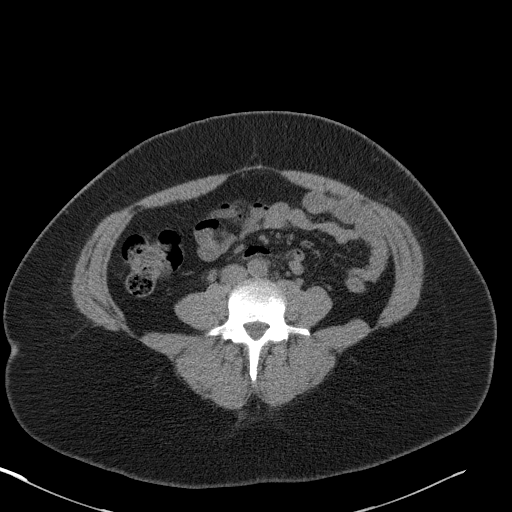
[im 50/85  soft-tissue]
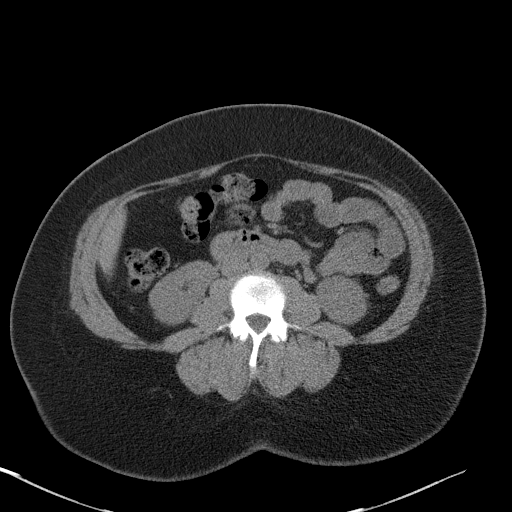
[im 57/85  soft-tissue]
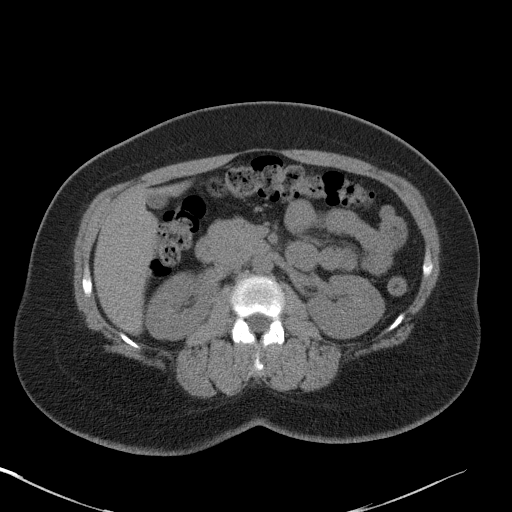
[im 57/85  bone]
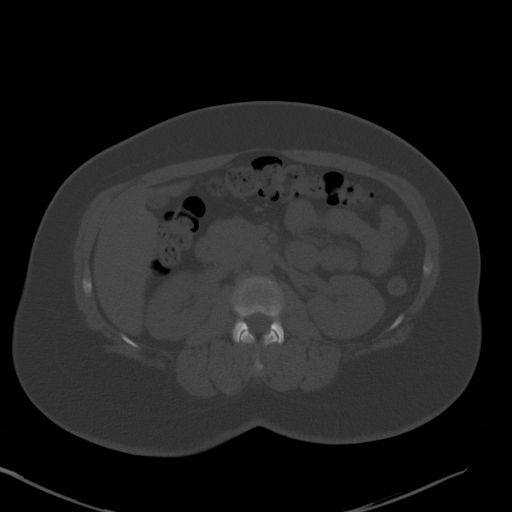
[im 60/85  soft-tissue]
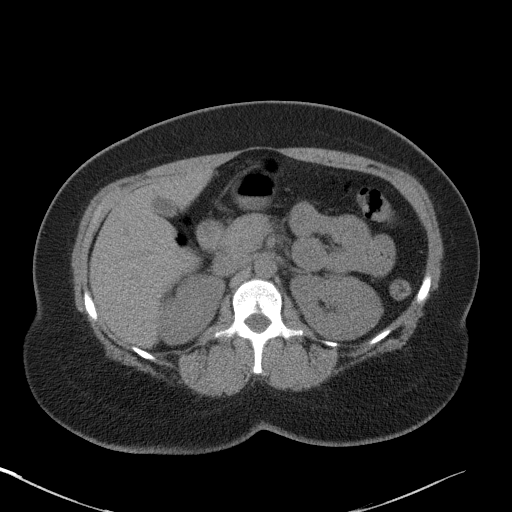
[im 67/85  soft-tissue]
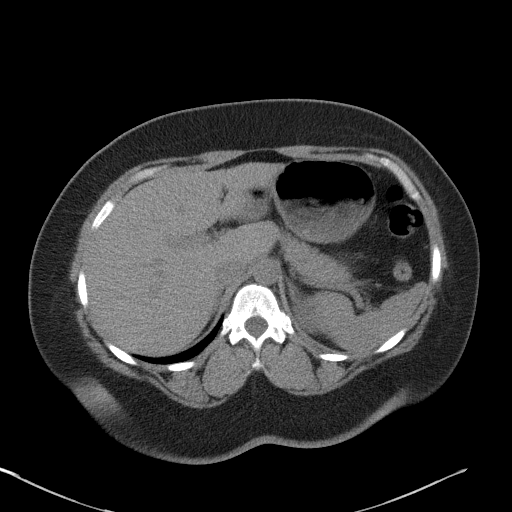
[im 74/85  soft-tissue]
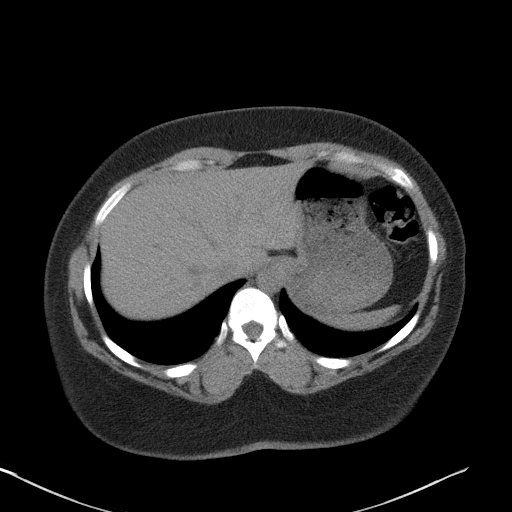
[im 81/85  soft-tissue]
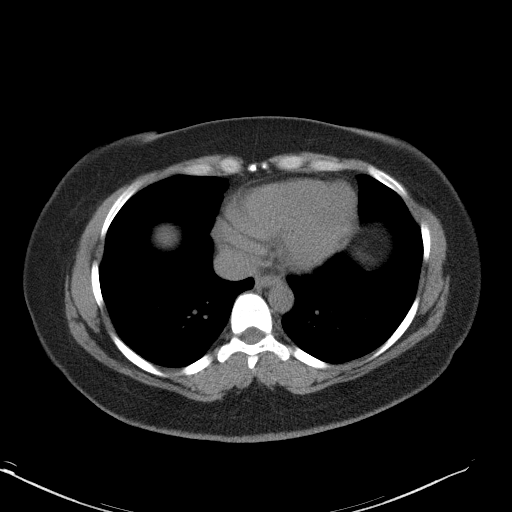

[Series 5: renal stone 3.0 coronal · coronal · 0.70mm/px · 3 of 100 slices shown]
[im 34/100  soft-tissue]
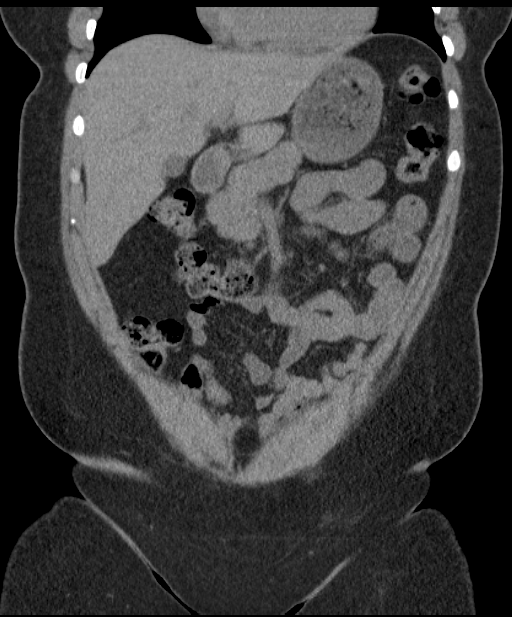
[im 45/100  soft-tissue]
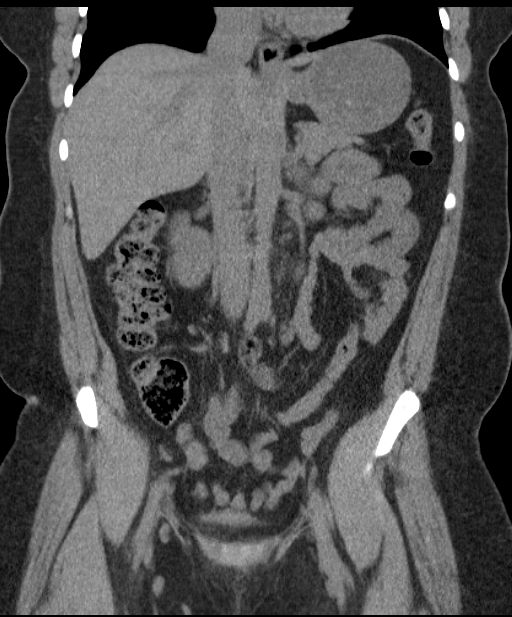
[im 56/100  soft-tissue]
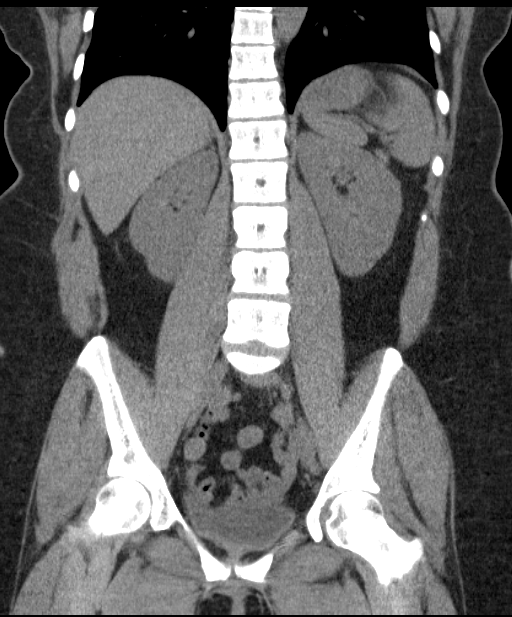

[16 of 46 positions shown; findings below may reference images not displayed]

FINDINGS: Clear lung bases. Normal heart size. No pericardial or pleural
effusion.

Abdomen: Kidneys demonstrate no acute perinephric inflammation,
hydronephrosis, obstructive uropathy, or obstructing ureteral
calculus on either side.

Liver, under distended gallbladder, biliary system, pancreas,
spleen, and adrenal glands are within normal limits for noncontrast
exam.

Negative for bowel obstruction, dilatation, ileus, or free air.

No abdominal free fluid, fluid collection, hemorrhage, abscess, or
adenopathy. Normal appendix demonstrated extending into the right
pelvis.

Pelvis: Postop changes from recent hysterectomy. No pelvic fluid
collection, abscess, hemorrhage, hematoma, adenopathy, inguinal
abnormality, or hernia. No acute distal bowel process.

Minor L5-S1 degenerative change. No acute osseous finding.
IMPRESSION: No acute obstructing ureteral calculus, hydronephrosis, or
obstructive uropathy.

Normal appendix

Postop changes from recent hysterectomy

Negative for an abscess or hematoma

## 2012-11-10 MED ORDER — OXYCODONE-ACETAMINOPHEN 5-325 MG PO TABS: 2.0000 | ORAL_TABLET | ORAL | Status: DC | PRN

## 2012-11-10 MED ORDER — HYDROMORPHONE HCL PF 2 MG/ML IJ SOLN
2.0000 mg | Freq: Once | INTRAMUSCULAR | Status: AC
  Administered 2012-11-10: 2 mg via INTRAMUSCULAR
  Filled 2012-11-10: qty 1

## 2012-11-10 MED ORDER — KETOROLAC TROMETHAMINE 60 MG/2ML IM SOLN
60.0000 mg | Freq: Once | INTRAMUSCULAR | Status: AC
  Administered 2012-11-10: 60 mg via INTRAMUSCULAR
  Filled 2012-11-10: qty 2

## 2012-11-10 MED ORDER — ONDANSETRON HCL 4 MG/2ML IJ SOLN
4.0000 mg | Freq: Once | INTRAMUSCULAR | Status: AC
  Administered 2012-11-10: 4 mg via INTRAMUSCULAR
  Filled 2012-11-10: qty 2

## 2012-11-10 NOTE — ED Provider Notes (Signed)
CSN: [id number]     Arrival date & time 11/10/12  1730 History   First MD Initiated Contact with Patient 11/10/12 1830     Chief Complaint  Patient presents with  . Abdominal Pain  . Back Pain   (Consider location/radiation/quality/duration/timing/severity/associated sxs/prior Treatment) Patient is a 32 y.o. female presenting with abdominal pain and back pain. The history is provided by the patient. No language interpreter was used.  Abdominal Pain Pain location:  L flank and LLQ Pain quality: aching   Pain radiates to:  Does not radiate Pain severity:  Moderate Onset quality:  Sudden Timing:  Constant Progression:  Worsening Chronicity:  New Relieved by:  Nothing Ineffective treatments:  None tried Associated symptoms: nausea and vomiting   Back Pain Associated symptoms: abdominal pain     Past Medical History  Diagnosis Date  . Mental disorder     Currently depressed  . PONV (postoperative nausea and vomiting)   . Hyperlipidemia     borderline - diet controlled  . Anxiety   . Depression   . Panic attack   . Anemia   . History of blood transfusion 06/2002    [facility] - unsure of number of units   Past Surgical History  Procedure Laterality Date  . Cesarean section  2002, 2004, 2006    x 3  . Tubal ligation    . Robotic assisted total hysterectomy N/A 09/25/2012    Procedure: ROBOTIC ASSISTED TOTAL HYSTERECTOMY;  Surgeon: Antionette Char, MD;  Location: [facility];  Service: Gynecology;  Laterality: N/A;  . Bilateral salpingectomy N/A 09/25/2012    Procedure: BILATERAL SALPINGECTOMY;  Surgeon: Antionette Char, MD;  Location: [facility];  Service: Gynecology;  Laterality: N/A;   Family History  Problem Relation Age of Onset  . Heart disease Other   . Hypertension Other    History  Substance Use Topics  . Smoking status: Current Some Day Smoker -- 0.20 packs/day for .5 years    Types: Cigarettes  . Smokeless tobacco: Never Used  . Alcohol Use: No   OB History   Grav  Para Term Preterm Abortions TAB SAB Ect Mult Living   3 3 3       3      Review of Systems  Gastrointestinal: Positive for nausea, vomiting and abdominal pain.  Musculoskeletal: Positive for back pain.  All other systems reviewed and are negative.    Allergies  Review of patient's allergies indicates no known allergies.  Home Medications   Current Outpatient Rx  Name  Route  Sig  Dispense  Refill  . ibuprofen (ADVIL,MOTRIN) 600 MG tablet   Oral   Take 1 tablet (600 mg total) by mouth every 6 (six) hours as needed for pain.   30 tablet   5   . oxycodone-acetaminophen (LYNOX) 5-300 MG per tablet   Oral   Take 2 tablets by mouth every 6 (six) hours as needed for pain.   60 tablet   0   . sertraline (ZOLOFT) 50 MG tablet   Oral   Take 25 mg by mouth daily.          BP 105/62  Pulse 53  Temp(Src) 98.6 F (37 C) (Oral)  Resp 16  Ht 5\' 5"  (1.651 m)  Wt 190 lb (86.183 kg)  BMI 31.62 kg/m2  SpO2 99%  LMP 09/11/2012 Physical Exam  Nursing note and vitals reviewed. Constitutional: She is oriented to person, place, and time. She appears well-developed and well-nourished.  HENT:  Head: Normocephalic.  Right Ear: External ear normal.  Left Ear: External ear normal.  Nose: Nose normal.  Mouth/Throat: Oropharynx is clear and moist.  Eyes: Conjunctivae are normal. Pupils are equal, round, and reactive to light.  Neck: Normal range of motion. Neck supple.  Cardiovascular: Normal rate.   Pulmonary/Chest: Effort normal.  Abdominal: Soft.  Musculoskeletal: Normal range of motion.  Neurological: She is alert and oriented to person, place, and time. She has normal reflexes.  Skin: Skin is warm.  Psychiatric: She has a normal mood and affect.    ED Course  Procedures (including critical care time) Labs Review Labs Reviewed  URINALYSIS, ROUTINE W REFLEX MICROSCOPIC - Abnormal; Notable for the following:    Hgb urine dipstick MODERATE (*)    Ketones, ur 15 (*)    All  other components within normal limits  URINE MICROSCOPIC-ADD ON - Abnormal; Notable for the following:    Bacteria, UA FEW (*)    All other components within normal limits   Imaging Review Ct Abdomen Pelvis Wo Contrast  11/10/2012   CLINICAL DATA:  Left flank and back pain, hematuria, recent hysterectomy next the 09/11/2010  EXAM: CT ABDOMEN AND PELVIS WITHOUT CONTRAST  TECHNIQUE: Multidetector CT imaging of the abdomen and pelvis was performed following the standard protocol without intravenous contrast.  COMPARISON:  09/11/2010  FINDINGS: Clear lung bases. Normal heart size. No pericardial or pleural effusion.  Abdomen: Kidneys demonstrate no acute perinephric inflammation, hydronephrosis, obstructive uropathy, or obstructing ureteral calculus on either side.  Liver, under distended gallbladder, biliary system, pancreas, spleen, and adrenal glands are within normal limits for noncontrast exam.  Negative for bowel obstruction, dilatation, ileus, or free air.  No abdominal free fluid, fluid collection, hemorrhage, abscess, or adenopathy. Normal appendix demonstrated extending into the right pelvis.  Pelvis: Postop changes from recent hysterectomy. No pelvic fluid collection, abscess, hemorrhage, hematoma, adenopathy, inguinal abnormality, or hernia. No acute distal bowel process.  Minor L5-S1 degenerative change. No acute osseous finding.  IMPRESSION: No acute obstructing ureteral calculus, hydronephrosis, or obstructive uropathy.  Normal appendix  Postop changes from recent hysterectomy  Negative for an abscess or hematoma   Electronically Signed   By: Ruel Favors M.D.   On: 11/10/2012 19:20    EKG Interpretation   None       MDM   1. Abdominal  pain, other specified site        Elson Areas, [place] 11/10/12 2004

## 2012-11-10 NOTE — ED Provider Notes (Signed)
Medical screening examination/treatment/procedure(s) were performed by non-physician practitioner and as supervising physician I was immediately available for consultation/collaboration.  EKG Interpretation   None        Ethelda Chick, MD 11/10/12 2008

## 2012-11-10 NOTE — ED Notes (Signed)
Suprapubic and low back pain since last week when pt walked excessively.  Partial Hysterectomy Sept 12th. Saw OBGYN yesterday Tamela Oddi, MD) and given rx for Percocet.  Pt sts her pain is worse today and she is not able to get any Prescriptions filled until Thursday.

## 2012-11-16 ENCOUNTER — Ambulatory Visit (INDEPENDENT_AMBULATORY_CARE_PROVIDER_SITE_OTHER): Payer: Medicaid Other | Admitting: Obstetrics & Gynecology

## 2012-11-16 ENCOUNTER — Encounter: Payer: Self-pay | Admitting: Obstetrics & Gynecology

## 2012-11-16 VITALS — BP 131/90 | HR 81 | Temp 98.4°F | Ht 65.0 in | Wt 195.0 lb

## 2012-11-16 DIAGNOSIS — Z09 Encounter for follow-up examination after completed treatment for conditions other than malignant neoplasm: Secondary | ICD-10-CM | POA: Insufficient documentation

## 2012-11-16 MED ORDER — OXYCODONE-ACETAMINOPHEN 5-325 MG PO TABS: 2.0000 | ORAL_TABLET | Freq: Four times a day (QID) | ORAL | Status: DC | PRN

## 2012-11-16 NOTE — Progress Notes (Signed)
Subjective:     Caroline Carlson is a 32 y.o. female here for a routine exam.  Current complaints: post op visit. Pt states she is continuing to have back pain. Recent CT negative. Pt states the pain has gotten slightly better.  Personal health questionnaire reviewed: yes.   Gynecologic History Patient's last menstrual period was 09/11/2012. Contraception: abstinence   Obstetric History OB History  Gravida Para Term Preterm AB SAB TAB Ectopic Multiple Living  3 3 3       3     # Outcome Date GA Lbr Len/2nd Weight Sex Delivery Anes PTL Lv  3 TRM           2 TRM           1 TRM                The following portions of the patient's history were reviewed and updated as appropriate: allergies, current medications, past family history, past medical history, past social history, past surgical history and problem list.  Review of Systems Pertinent items are noted in HPI.    Objective:    Abd: soft, NT, ND;  Incisions well-healed Pelvic: cuff intact, well-healed; NT, no palpable masses  Assessment:   Normal postoperative exam  Plan:    OK to resume intercourse when 8 weeks postop Return in 1 mth

## 2012-12-28 ENCOUNTER — Ambulatory Visit: Payer: Medicaid Other | Admitting: Obstetrics & Gynecology

## 2013-01-20 ENCOUNTER — Ambulatory Visit (INDEPENDENT_AMBULATORY_CARE_PROVIDER_SITE_OTHER): Payer: Medicaid Other | Admitting: Obstetrics & Gynecology

## 2013-01-20 ENCOUNTER — Ambulatory Visit: Payer: Medicaid Other | Admitting: Obstetrics & Gynecology

## 2013-01-20 ENCOUNTER — Encounter: Payer: Self-pay | Admitting: Obstetrics & Gynecology

## 2013-01-20 VITALS — HR 68 | Temp 98.0°F | Ht 65.0 in | Wt 198.0 lb

## 2013-01-20 DIAGNOSIS — Z09 Encounter for follow-up examination after completed treatment for conditions other than malignant neoplasm: Secondary | ICD-10-CM

## 2013-01-20 DIAGNOSIS — N760 Acute vaginitis: Secondary | ICD-10-CM

## 2013-01-20 DIAGNOSIS — B9689 Other specified bacterial agents as the cause of diseases classified elsewhere: Secondary | ICD-10-CM

## 2013-01-20 DIAGNOSIS — A499 Bacterial infection, unspecified: Secondary | ICD-10-CM

## 2013-01-20 MED ORDER — ORLISTAT 120 MG PO CAPS: 120.0000 mg | ORAL_CAPSULE | Freq: Three times a day (TID) | ORAL | Status: DC

## 2013-01-20 NOTE — Patient Instructions (Signed)
Orlistat capsules What is this medicine? Orlistat (OR li stat) is used to help obese people lose weight and keep the weight off while eating a reduced-calorie diet. This medicine decreases the amount of fat that is absorbed from your diet. This medicine may be used for other purposes; ask your health care provider or pharmacist if you have questions. COMMON BRAND NAME(S): alli, Xenical What should I tell my health care provider before I take this medicine? They need to know if you have any of these conditions: -an eating disorder, such as anorexia or bulimia -gallbladder problems or gallstones -problems absorbing food -an unusual or allergic reaction to orlistat, other medicines, foods, dyes, supplements or preservatives -pregnant or trying to get pregnant -breast-feeding How should I use this medicine? Take this medicine by mouth with a glass of water. Follow the directions on the prescription label. Take this medicine with each main meal that contains about 30 percent of the calories from fat or one hour after the meal. Do not take your medicine more often than directed. If you occasionally miss a meal or have a meal without fat, you can skip that dose of this medicine. Talk to your pediatrician regarding the use of this medicine in children. Special care may be needed. While this drug may be prescribed for children as young as 12 years for selected conditions, precautions do apply. Overdosage: If you think you have taken too much of this medicine contact a poison control center or emergency room at once. NOTE: This medicine is only for you. Do not share this medicine with others. What if I miss a dose? If you miss a dose, take it within one hour following the meal that contains fat. If it is almost time for your next dose, take only that dose. Do not take double or extra doses. What may interact with this medicine? -certain medicines for seizures like carbamazepine, phenobarbital,  phenytoin -cyclosporine -dietary supplements like beta-carotene and vitamins A, D, E, and K -thyroid medicine -warfarin This list may not describe all possible interactions. Give your health care provider a list of all the medicines, herbs, non-prescription drugs, or dietary supplements you use. Also tell them if you smoke, drink alcohol, or use illegal drugs. Some items may interact with your medicine. What should I watch for while using this medicine? Changes in your eyesight, skin or hair may be signs of a vitamin deficiency. Since this medicine can cause decreased absorption of some fat-soluble vitamins, you may need to take a daily multivitamin that contains normal amounts of vitamins D, E, K and beta-carotene. Take the multivitamin once per day at least 2 hours after your dose of this medicine unless otherwise directed by your doctor or healthcare professional. You should use this medicine with a reduced-calorie diet that contains no more than about 30 percent of the calories from fat. Divide your daily intake of fat, carbohydrates, and protein evenly over your 3 main meals. You should try to follow a healthy eating plan like the one developed by the American Heart Association. Following this eating plan can help reduce the possible side effects from this medicine. If you are taking levothyroxine, this medicine and levothyroxine should be taken at least 4 hours apart. What side effects may I notice from receiving this medicine? Side effects that you should report to your doctor or health care professional as soon as possible: -allergic reactions like skin rash, itching or hives, swelling of the face, lips, or tongue -arthritis or joint pain,  tenderness -back pain -blood in the urine -breathing problems -dark urine -feeling faint. lightheaded -general ill feeling or flu-like symptoms -light-colored stools -loss of appetite -nausea, vomiting -right upper belly pain -uncontrolled, urgent  bowel movements -unusually weak or tired -yellowing of the eyes or skin Side effects that usually do not require medical attention (report to your doctor or health care professional if they continue or are bothersome): -clear, orange or brown colored bowel movements -increased number of bowel movements -oily stools -stomach discomfort, gas This list may not describe all possible side effects. Call your doctor for medical advice about side effects. You may report side effects to FDA at 1-800-FDA-1088. Where should I keep my medicine? Keep out of the reach of children. Storage at room temperature between 15 and 30 degrees C (59 and 86 degrees F). Keep container tightly closed. Throw away any unused medicine after the expiration date. NOTE: This sheet is a summary. It may not cover all possible information. If you have questions about this medicine, talk to your doctor, pharmacist, or health care provider.  2014, Elsevier/Gold Standard. (2011-11-05 10:00:18)

## 2013-01-20 NOTE — Progress Notes (Signed)
Subjective:     Caroline Carlson is a 33 y.o. female here for  Exam following total hysterectomy.  Current complaints: denies any concerns, denies pain or drainage from surgical sites.  Personal health questionnaire reviewed: yes.   Gynecologic History Patient's last menstrual period was 09/11/2012. Contraception: status post hysterectomy Last Pap: 08/14/2012. Results were: normal Last mammogram: N/A  Obstetric History OB History  Gravida Para Term Preterm AB SAB TAB Ectopic Multiple Living  3 3 3       3     # Outcome Date GA Lbr Len/2nd Weight Sex Delivery Anes PTL Lv  3 TRM           2 TRM           1 TRM                The following portions of the patient's history were reviewed and updated as appropriate: allergies, current medications, past family history, past medical history, past social history, past surgical history and problem list.  Review of Systems Pertinent items are noted in HPI.    Objective:     SPEC: scant, white vaginal discharge  Bimanual: cuff intact, NT; no masses     Assessment:   Entry dyspareunia--doubt neuropathic pain  R/O vaginitis Plan:   Orders Placed This Encounter  Procedures  . WET PREP BY MOLECULAR PROBE  Return in a few months

## 2013-01-21 LAB — WET PREP BY MOLECULAR PROBE
Candida species: NEGATIVE
Gardnerella vaginalis: NEGATIVE
TRICHOMONAS VAG: NEGATIVE

## 2013-01-27 ENCOUNTER — Other Ambulatory Visit: Payer: Self-pay | Admitting: Obstetrics & Gynecology

## 2013-02-02 ENCOUNTER — Encounter: Payer: Self-pay | Admitting: Obstetrics & Gynecology

## 2013-02-08 ENCOUNTER — Telehealth: Payer: Self-pay | Admitting: *Deleted

## 2013-02-08 NOTE — Telephone Encounter (Signed)
Message copied by Romana Juniper on Mon Feb 08, 2013 10:57 AM ------      Message from: Lahoma Crocker      Created: Wed Jan 27, 2013  3:04 PM       Can call in rx for phentermine 3.75mg /topiramate 23 mg combination--take 1 pill daily.  Call if no response in 14 days.  Needs monthly f/u; consistent contraception--medication contraindicated in pregnancy ------

## 2013-02-08 NOTE — Telephone Encounter (Signed)
Rx has been called for patient - she has f/u appt scheduled.

## 2013-02-09 ENCOUNTER — Encounter (HOSPITAL_BASED_OUTPATIENT_CLINIC_OR_DEPARTMENT_OTHER): Payer: Self-pay | Admitting: Emergency Medicine

## 2013-02-09 ENCOUNTER — Emergency Department (HOSPITAL_BASED_OUTPATIENT_CLINIC_OR_DEPARTMENT_OTHER)
Admission: EM | Admit: 2013-02-09 | Discharge: 2013-02-09 | Disposition: A | Discharge status: Home / Self Care | Payer: Medicaid Other | Attending: Emergency Medicine | Admitting: Emergency Medicine

## 2013-02-09 ENCOUNTER — Emergency Department (HOSPITAL_BASED_OUTPATIENT_CLINIC_OR_DEPARTMENT_OTHER): Payer: Medicaid Other

## 2013-02-09 DIAGNOSIS — J209 Acute bronchitis, unspecified: Secondary | ICD-10-CM | POA: Insufficient documentation

## 2013-02-09 DIAGNOSIS — M25519 Pain in unspecified shoulder: Secondary | ICD-10-CM | POA: Insufficient documentation

## 2013-02-09 DIAGNOSIS — Z9851 Tubal ligation status: Secondary | ICD-10-CM | POA: Insufficient documentation

## 2013-02-09 DIAGNOSIS — Z79899 Other long term (current) drug therapy: Secondary | ICD-10-CM | POA: Insufficient documentation

## 2013-02-09 DIAGNOSIS — F3289 Other specified depressive episodes: Secondary | ICD-10-CM | POA: Insufficient documentation

## 2013-02-09 DIAGNOSIS — Z862 Personal history of diseases of the blood and blood-forming organs and certain disorders involving the immune mechanism: Secondary | ICD-10-CM | POA: Insufficient documentation

## 2013-02-09 DIAGNOSIS — M549 Dorsalgia, unspecified: Secondary | ICD-10-CM | POA: Insufficient documentation

## 2013-02-09 DIAGNOSIS — F410 Panic disorder [episodic paroxysmal anxiety] without agoraphobia: Secondary | ICD-10-CM | POA: Insufficient documentation

## 2013-02-09 DIAGNOSIS — Z8639 Personal history of other endocrine, nutritional and metabolic disease: Secondary | ICD-10-CM | POA: Insufficient documentation

## 2013-02-09 DIAGNOSIS — F329 Major depressive disorder, single episode, unspecified: Secondary | ICD-10-CM | POA: Insufficient documentation

## 2013-02-09 DIAGNOSIS — H9209 Otalgia, unspecified ear: Secondary | ICD-10-CM | POA: Insufficient documentation

## 2013-02-09 DIAGNOSIS — J40 Bronchitis, not specified as acute or chronic: Secondary | ICD-10-CM

## 2013-02-09 DIAGNOSIS — Z90710 Acquired absence of both cervix and uterus: Secondary | ICD-10-CM | POA: Insufficient documentation

## 2013-02-09 DIAGNOSIS — F17200 Nicotine dependence, unspecified, uncomplicated: Secondary | ICD-10-CM | POA: Insufficient documentation

## 2013-02-09 IMAGING — CR DG CHEST 2V
2 series · 2 of 2 positions shown · non-contrast
Comparison: [DATE]

CLINICAL DATA: Cough and congestion

EXAM:
CHEST  2 VIEW

[w chest pa]
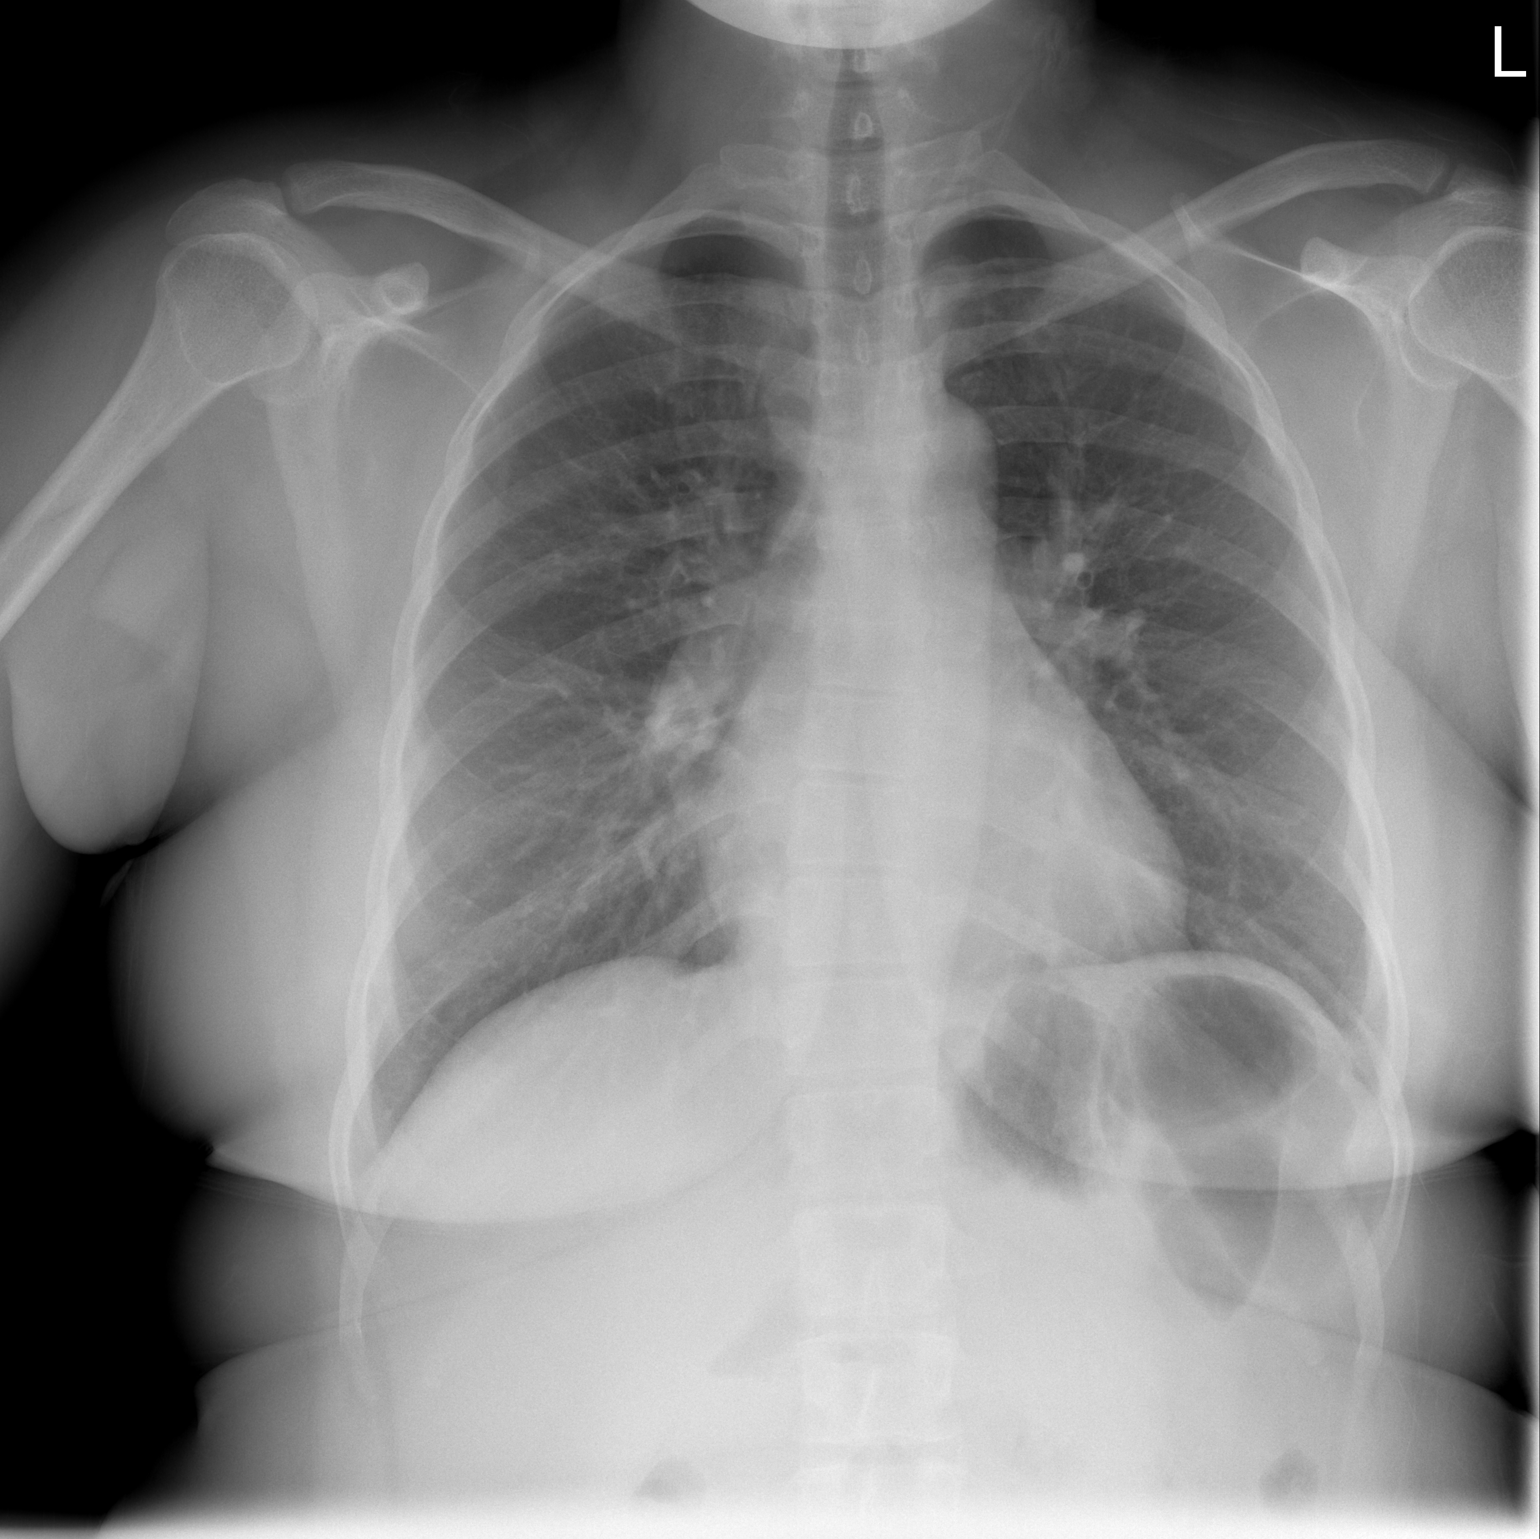

[w chest lat]
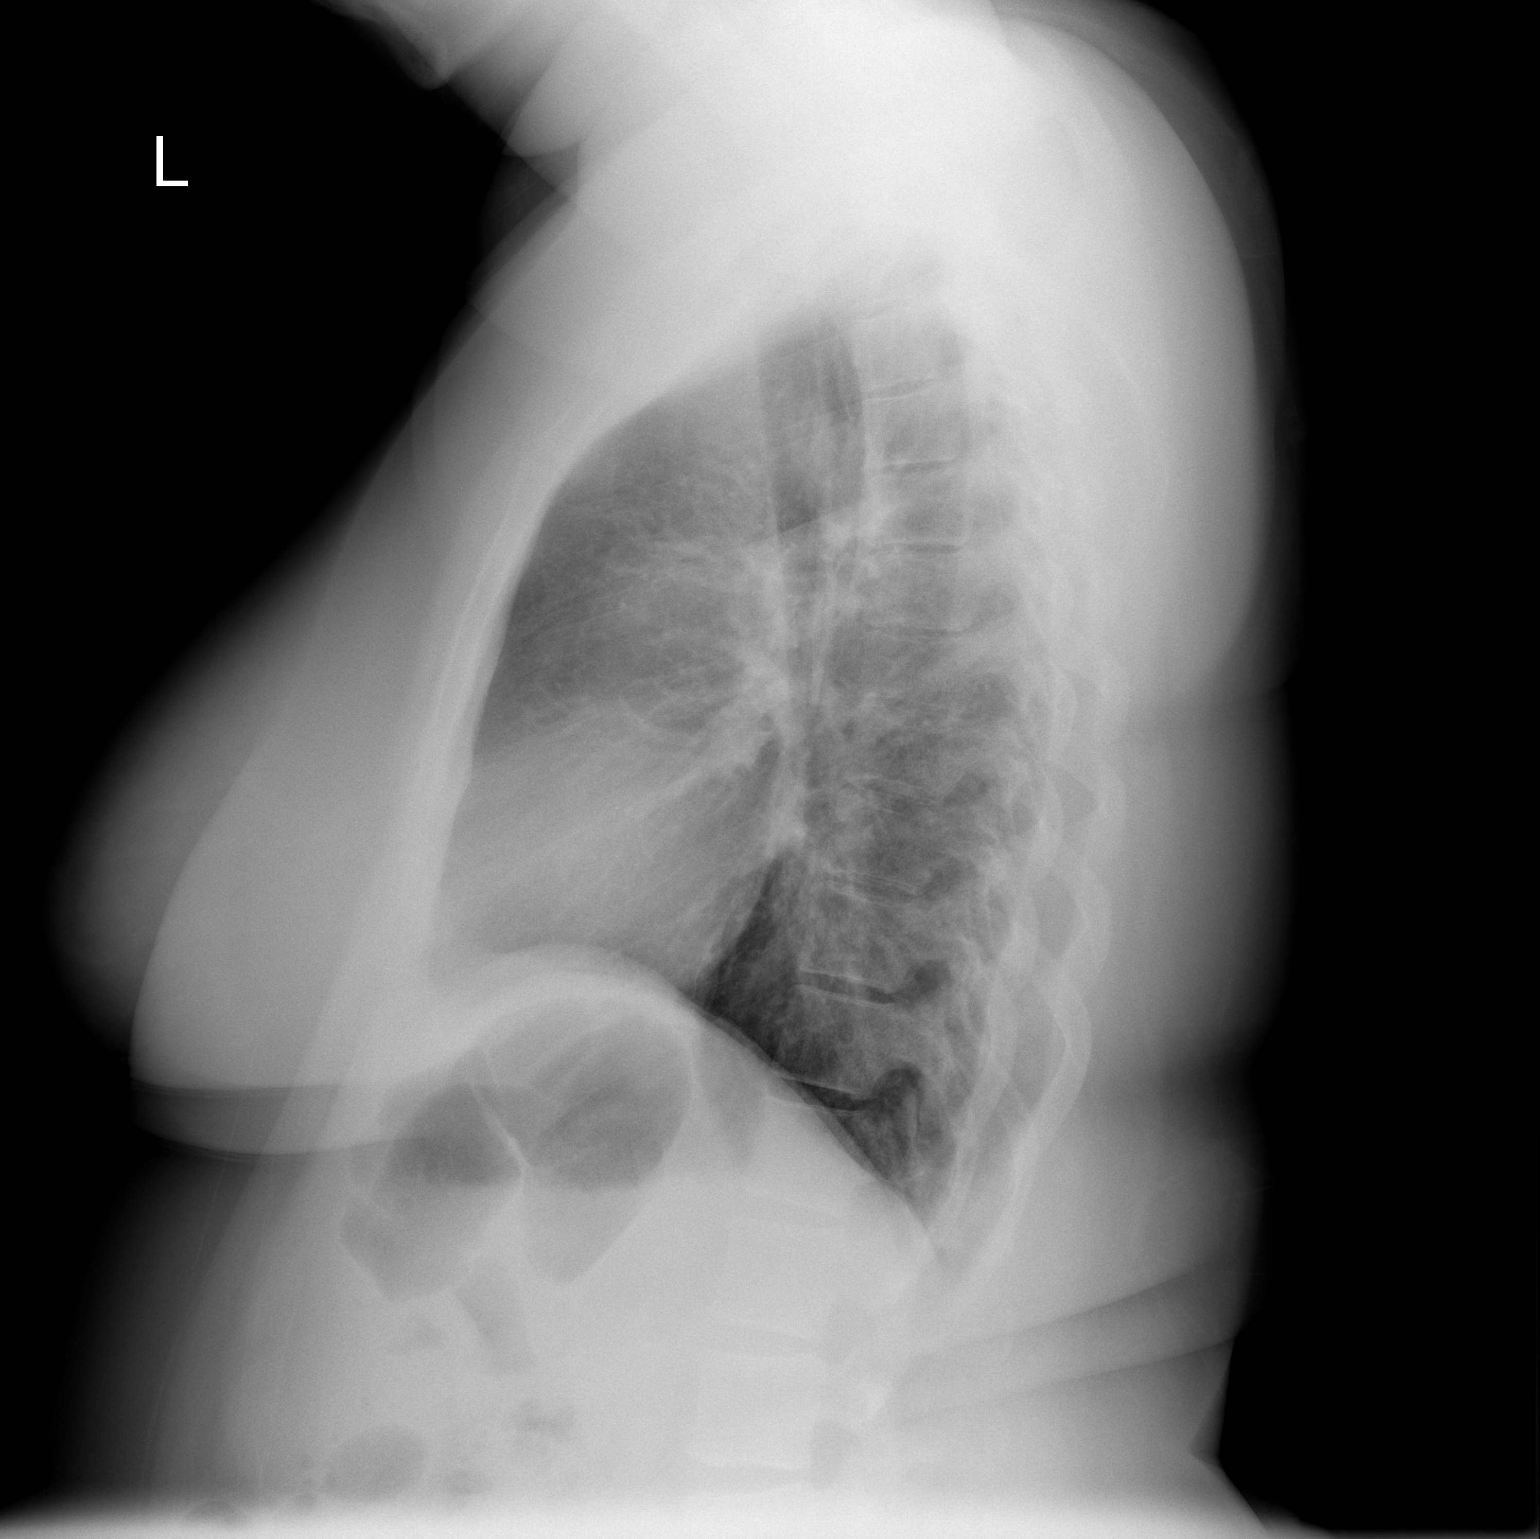

[2 of 2 positions shown; findings below may reference images not displayed]

FINDINGS: The heart size and mediastinal contours are within normal limits.
Both lungs are clear. The visualized skeletal structures are
unremarkable.
IMPRESSION: No active cardiopulmonary disease.

## 2013-02-09 MED ORDER — AZITHROMYCIN 250 MG PO TABS: 250.0000 mg | ORAL_TABLET | Freq: Every day | ORAL | Status: DC

## 2013-02-09 MED ORDER — IBUPROFEN 800 MG PO TABS
800.0000 mg | ORAL_TABLET | Freq: Once | ORAL | Status: AC
  Administered 2013-02-09: 800 mg via ORAL
  Filled 2013-02-09: qty 1

## 2013-02-09 MED ORDER — HYDROCODONE-ACETAMINOPHEN 5-325 MG PO TABS: 2.0000 | ORAL_TABLET | ORAL | Status: DC | PRN

## 2013-02-09 MED ORDER — BENZONATATE 100 MG PO CAPS: 100.0000 mg | ORAL_CAPSULE | Freq: Three times a day (TID) | ORAL | Status: DC

## 2013-02-09 NOTE — ED Notes (Signed)
C/o body aches, cough x 2 days

## 2013-02-09 NOTE — ED Notes (Signed)
Cough congestion generalized body aches, ear pain symptoms onset 2 weeks ago  But worse today

## 2013-02-09 NOTE — Discharge Instructions (Signed)

## 2013-02-09 NOTE — ED Provider Notes (Signed)
CSN: [id number]     Arrival date & time 02/09/13  1928 History  This chart was scribed for non-physician practitioner working with [place], DO by Stacy Gardner, ED scribe. This patient was seen in room MH01/MH01 and the patient's care was started at 8:43 PM.  First MD Initiated Contact with Patient 02/09/13 2026     Chief Complaint  Patient presents with  . URI   (Consider location/radiation/quality/duration/timing/severity/associated sxs/prior Treatment) HPI HPI Comments: Caroline Carlson is a 33 y.o. female who presents to the Emergency Department complaining of URI symptoms that has gotten progressively worse for the past two days. Pt has the associated symptoms of generalized body aches, severe constant cough, back pain, chest soreness, ear pain, headaches, and shoulder pain. Pt reports having a persistent cough with constant "spitting" since her partial hysterectomy surgery four months ago.  She had sick contact with her children. Pt mentions she her family had similar symptoms prior to this episode which resolved and returned recently. Pt mentions her children are showing similar symptoms. Pt has a past medical hx of anemia. She does not have nay known allergies to medications. She currently smokes 0.2 packs of cigarettes a day.   Past Medical History  Diagnosis Date  . Mental disorder     Currently depressed  . PONV (postoperative nausea and vomiting)   . Hyperlipidemia     borderline - diet controlled  . Anxiety   . Depression   . Panic attack   . Anemia   . History of blood transfusion 06/2002    [facility] - unsure of number of units   Past Surgical History  Procedure Laterality Date  . Cesarean section  2002, 2004, 2006    x 3  . Tubal ligation    . Robotic assisted total hysterectomy N/A 09/25/2012    Procedure: ROBOTIC ASSISTED TOTAL HYSTERECTOMY;  Surgeon: Lahoma Crocker, MD;  Location: [place] [facility];  Service: Gynecology;  Laterality: N/A;  . Bilateral  salpingectomy N/A 09/25/2012    Procedure: BILATERAL SALPINGECTOMY;  Surgeon: Lahoma Crocker, MD;  Location: [facility];  Service: Gynecology;  Laterality: N/A;   Family History  Problem Relation Age of Onset  . Heart disease Other   . Hypertension Other    History  Substance Use Topics  . Smoking status: Current Some Day Smoker -- 0.20 packs/day for .5 years    Types: Cigarettes  . Smokeless tobacco: Never Used     Comment: trying to quit  . Alcohol Use: No   OB History   Grav Para Term Preterm Abortions TAB SAB Ect Mult Living   3 3 3       3      Review of Systems  Constitutional: Positive for chills.  HENT: Positive for ear pain.   Respiratory: Positive for cough.   Cardiovascular: Positive for chest pain.  Musculoskeletal: Positive for arthralgias, back pain and myalgias.  Neurological: Positive for headaches.  All other systems reviewed and are negative.    Allergies  Review of patient's allergies indicates no known allergies.  Home Medications   Current Outpatient Rx  Name  Route  Sig  Dispense  Refill  . Phentermine-Topiramate (QSYMIA PO)   Oral   Take by mouth.         Marland Kitchen ibuprofen (ADVIL,MOTRIN) 600 MG tablet   Oral   Take 1 tablet (600 mg total) by mouth every 6 (six) hours as needed for pain.   30 tablet   5   .  oxyCODONE-acetaminophen (PERCOCET/ROXICET) 5-325 MG per tablet   Oral   Take 2 tablets by mouth every 6 (six) hours as needed for pain.   30 tablet   0   . sertraline (ZOLOFT) 50 MG tablet   Oral   Take 25 mg by mouth daily.          BP 123/69  Pulse 91  Temp(Src) 98.5 F (36.9 C) (Oral)  Resp 20  Ht 5\' 5"  (1.651 m)  Wt 190 lb (86.183 kg)  BMI 31.62 kg/m2  SpO2 99%  LMP 09/11/2012 Physical Exam  Nursing note and vitals reviewed. Constitutional: She is oriented to person, place, and time. She appears well-developed and well-nourished. No distress.  HENT:  Head: Atraumatic. Macrocephalic.  Mouth/Throat: Posterior  oropharyngeal erythema present.  Throat erythematous Harsh cough  Eyes: EOM are normal.  Neck: Normal range of motion.  Cardiovascular: Normal rate, regular rhythm and normal heart sounds.   Pulmonary/Chest: Effort normal and breath sounds normal.  Abdominal: Soft. She exhibits no distension. There is no tenderness.  Musculoskeletal: Normal range of motion.  Neurological: She is alert and oriented to person, place, and time.  Skin: Skin is warm and dry.  Psychiatric: She has a normal mood and affect. Judgment normal.    ED Course  Procedures (including critical care time) DIAGNOSTIC STUDIES: Oxygen Saturation is 99% on room air, normal by my interpretation.    COORDINATION OF CARE:  8:45 PM Discussed course of care with pt which includes chest x-ray and ibuprofen. Pt understands and agrees.    Labs Review Labs Reviewed - No data to display Imaging Review No results found.  EKG Interpretation   None       MDM Chest xray no pneumonia,    I will treat for bronchitis.   Pt has had cough for over a month.   Pt given rx for zithromax and tessalon perles for cough   1. Bronchitis     I personally performed the services in this documentation, which was scribed in my presence.  The recorded information has been reviewed and considered.   Ronnald Collum.   [place], PA-C 02/09/13 2149

## 2013-02-09 NOTE — ED Provider Notes (Signed)
Medical screening examination/treatment/procedure(s) were performed by non-physician practitioner and as supervising physician I was immediately available for consultation/collaboration.  EKG Interpretation   None         Delice Bison Shahrukh Pasch, DO 02/09/13 2328

## 2013-02-15 ENCOUNTER — Ambulatory Visit (INDEPENDENT_AMBULATORY_CARE_PROVIDER_SITE_OTHER): Payer: Medicaid Other | Admitting: Obstetrics & Gynecology

## 2013-02-15 VITALS — BP 105/74 | HR 68 | Temp 98.3°F | Wt 195.0 lb

## 2013-02-15 DIAGNOSIS — F3289 Other specified depressive episodes: Secondary | ICD-10-CM

## 2013-02-15 DIAGNOSIS — F329 Major depressive disorder, single episode, unspecified: Secondary | ICD-10-CM

## 2013-02-15 NOTE — Progress Notes (Signed)
Subjective:     Caroline Carlson is a 33 y.o. female here for a follow up medication exam.  Current complaints:  Pt was given Zoloft by Michaelyn Barter in August 2014. Pt states that she doesn't feel that the medication was working for her.  Pt states that she noticed tingling in the hands/finger while taking.  Pt was seen in the ED at [facility] with recent diagnosis of Bronchitis. Pt hasn't taken medication since then.  Personal health questionnaire reviewed: yes.   Gynecologic History Patient's last menstrual period was 09/11/2012. Contraception: none Last Pap: 08/2012. Results were: normal Last mammogram: n/a  Obstetric History OB History  Gravida Para Term Preterm AB SAB TAB Ectopic Multiple Living  3 3 3       3     # Outcome Date GA Lbr Len/2nd Weight Sex Delivery Anes PTL Lv  3 TRM           2 TRM           1 TRM               Review of Systems Pertinent items are noted in HPI.   Objective:    BP 105/74  Pulse 68  Temp(Src) 98.3 F (36.8 C)  Wt 195 lb (88.451 kg)  LMP 09/11/2012  General Appearance:    Alert, cooperative, no distress, appears stated age  Abdomen:     Soft, non-tender, bowel sounds active all four quadrants,    no masses, no organomegaly  Genitalia:    Normal female without lesion, discharge or tenderness   Assessment:   Healthy female exam.   Plan:   Referral to primary care Follow up in 6 months

## 2013-02-17 ENCOUNTER — Encounter: Payer: Self-pay | Admitting: Obstetrics & Gynecology

## 2013-02-24 ENCOUNTER — Ambulatory Visit: Payer: Medicaid Other | Admitting: Obstetrics & Gynecology

## 2013-03-04 ENCOUNTER — Ambulatory Visit: Payer: Medicaid Other | Admitting: Dietician

## 2013-04-24 ENCOUNTER — Emergency Department (HOSPITAL_BASED_OUTPATIENT_CLINIC_OR_DEPARTMENT_OTHER): Payer: Medicaid Other

## 2013-04-24 ENCOUNTER — Emergency Department (HOSPITAL_BASED_OUTPATIENT_CLINIC_OR_DEPARTMENT_OTHER)
Admission: EM | Admit: 2013-04-24 | Discharge: 2013-04-24 | Disposition: A | Discharge status: Home / Self Care | Payer: Medicaid Other | Attending: Emergency Medicine | Admitting: Emergency Medicine

## 2013-04-24 ENCOUNTER — Encounter (HOSPITAL_BASED_OUTPATIENT_CLINIC_OR_DEPARTMENT_OTHER): Payer: Self-pay | Admitting: Emergency Medicine

## 2013-04-24 DIAGNOSIS — F17200 Nicotine dependence, unspecified, uncomplicated: Secondary | ICD-10-CM | POA: Insufficient documentation

## 2013-04-24 DIAGNOSIS — Z8659 Personal history of other mental and behavioral disorders: Secondary | ICD-10-CM | POA: Insufficient documentation

## 2013-04-24 DIAGNOSIS — M546 Pain in thoracic spine: Secondary | ICD-10-CM | POA: Insufficient documentation

## 2013-04-24 DIAGNOSIS — M549 Dorsalgia, unspecified: Secondary | ICD-10-CM

## 2013-04-24 DIAGNOSIS — Z8639 Personal history of other endocrine, nutritional and metabolic disease: Secondary | ICD-10-CM | POA: Insufficient documentation

## 2013-04-24 DIAGNOSIS — Z862 Personal history of diseases of the blood and blood-forming organs and certain disorders involving the immune mechanism: Secondary | ICD-10-CM | POA: Insufficient documentation

## 2013-04-24 IMAGING — CR DG CHEST 2V
2 series · 2 of 2 positions shown · non-contrast
Comparison: DG CHEST 2 VIEW dated [DATE]; DG CHEST 2 VIEW dated
[DATE]

CLINICAL DATA: Back pain for 1 week following going to the gym

EXAM:
CHEST  2 VIEW

[w chest pa]
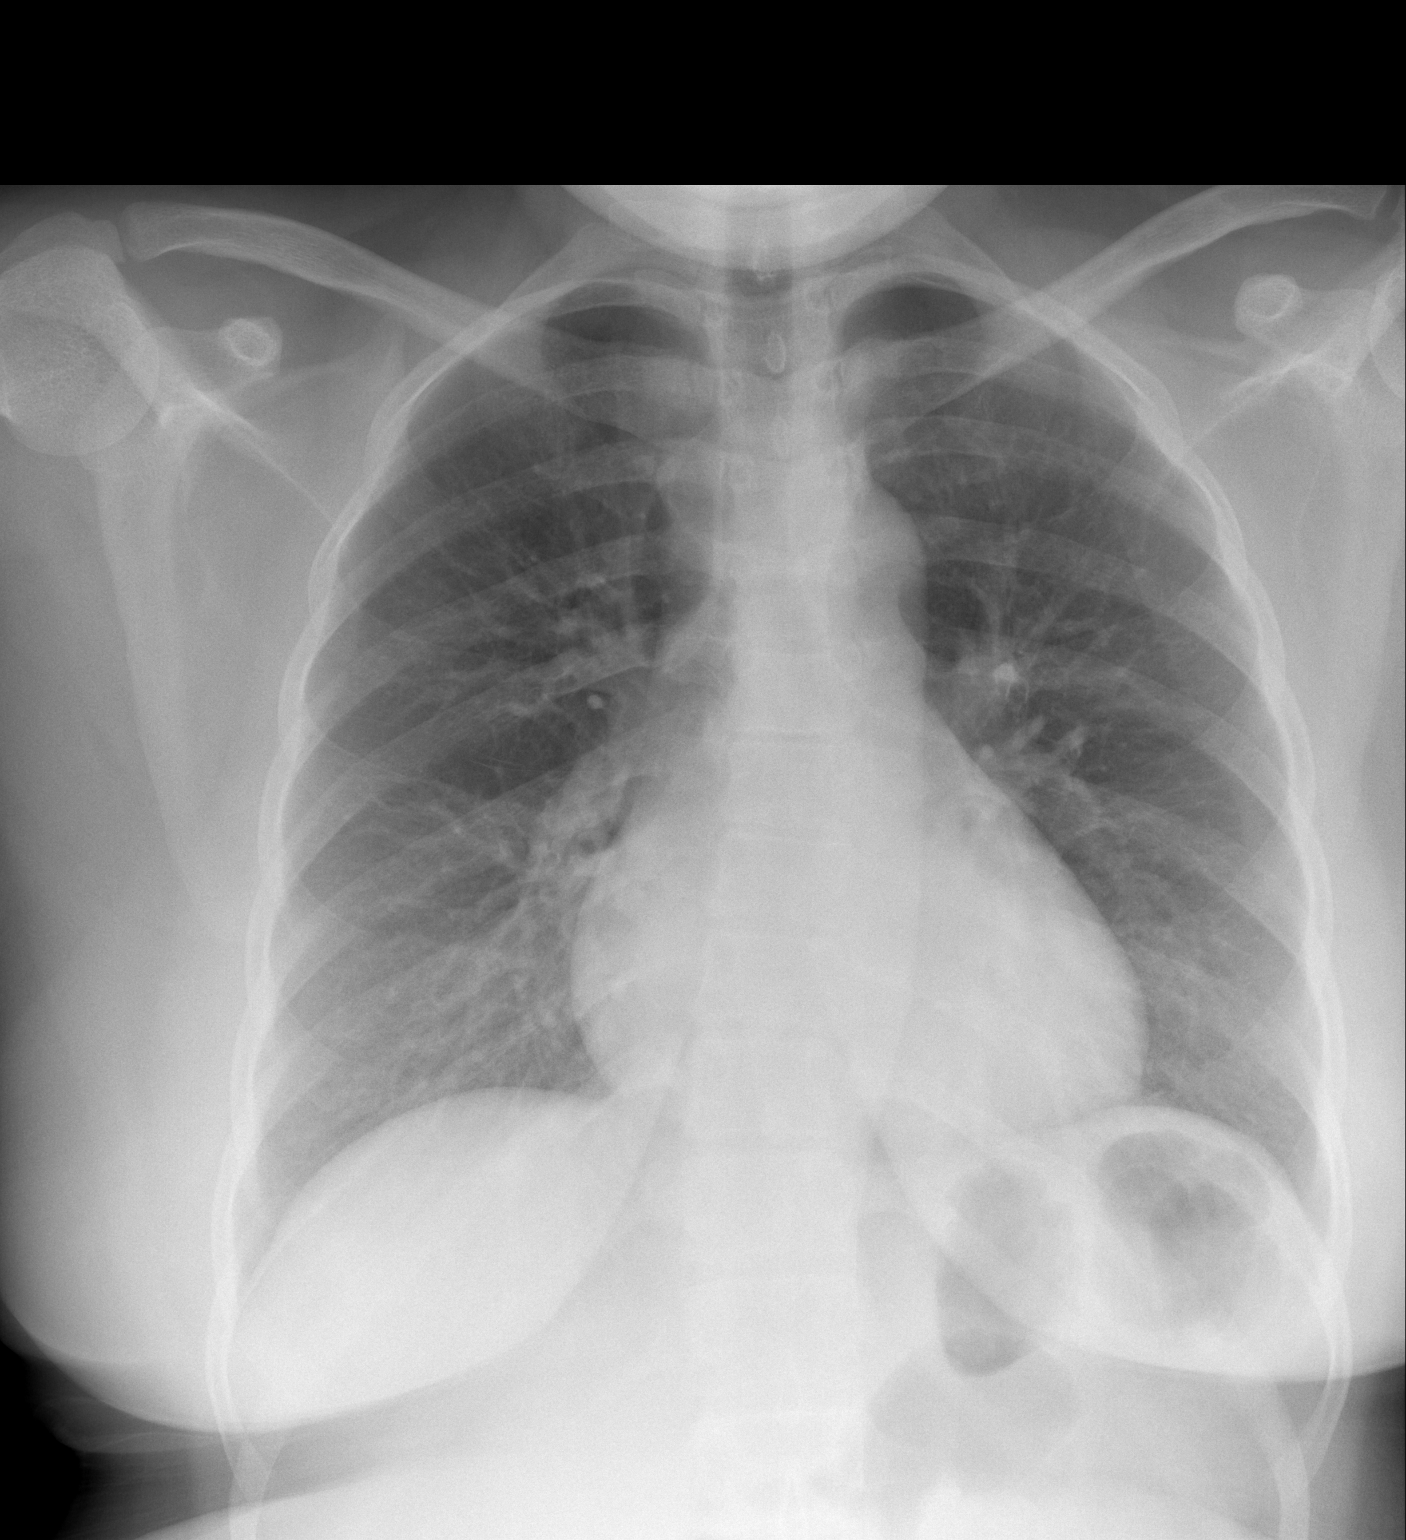

[w chest lat]
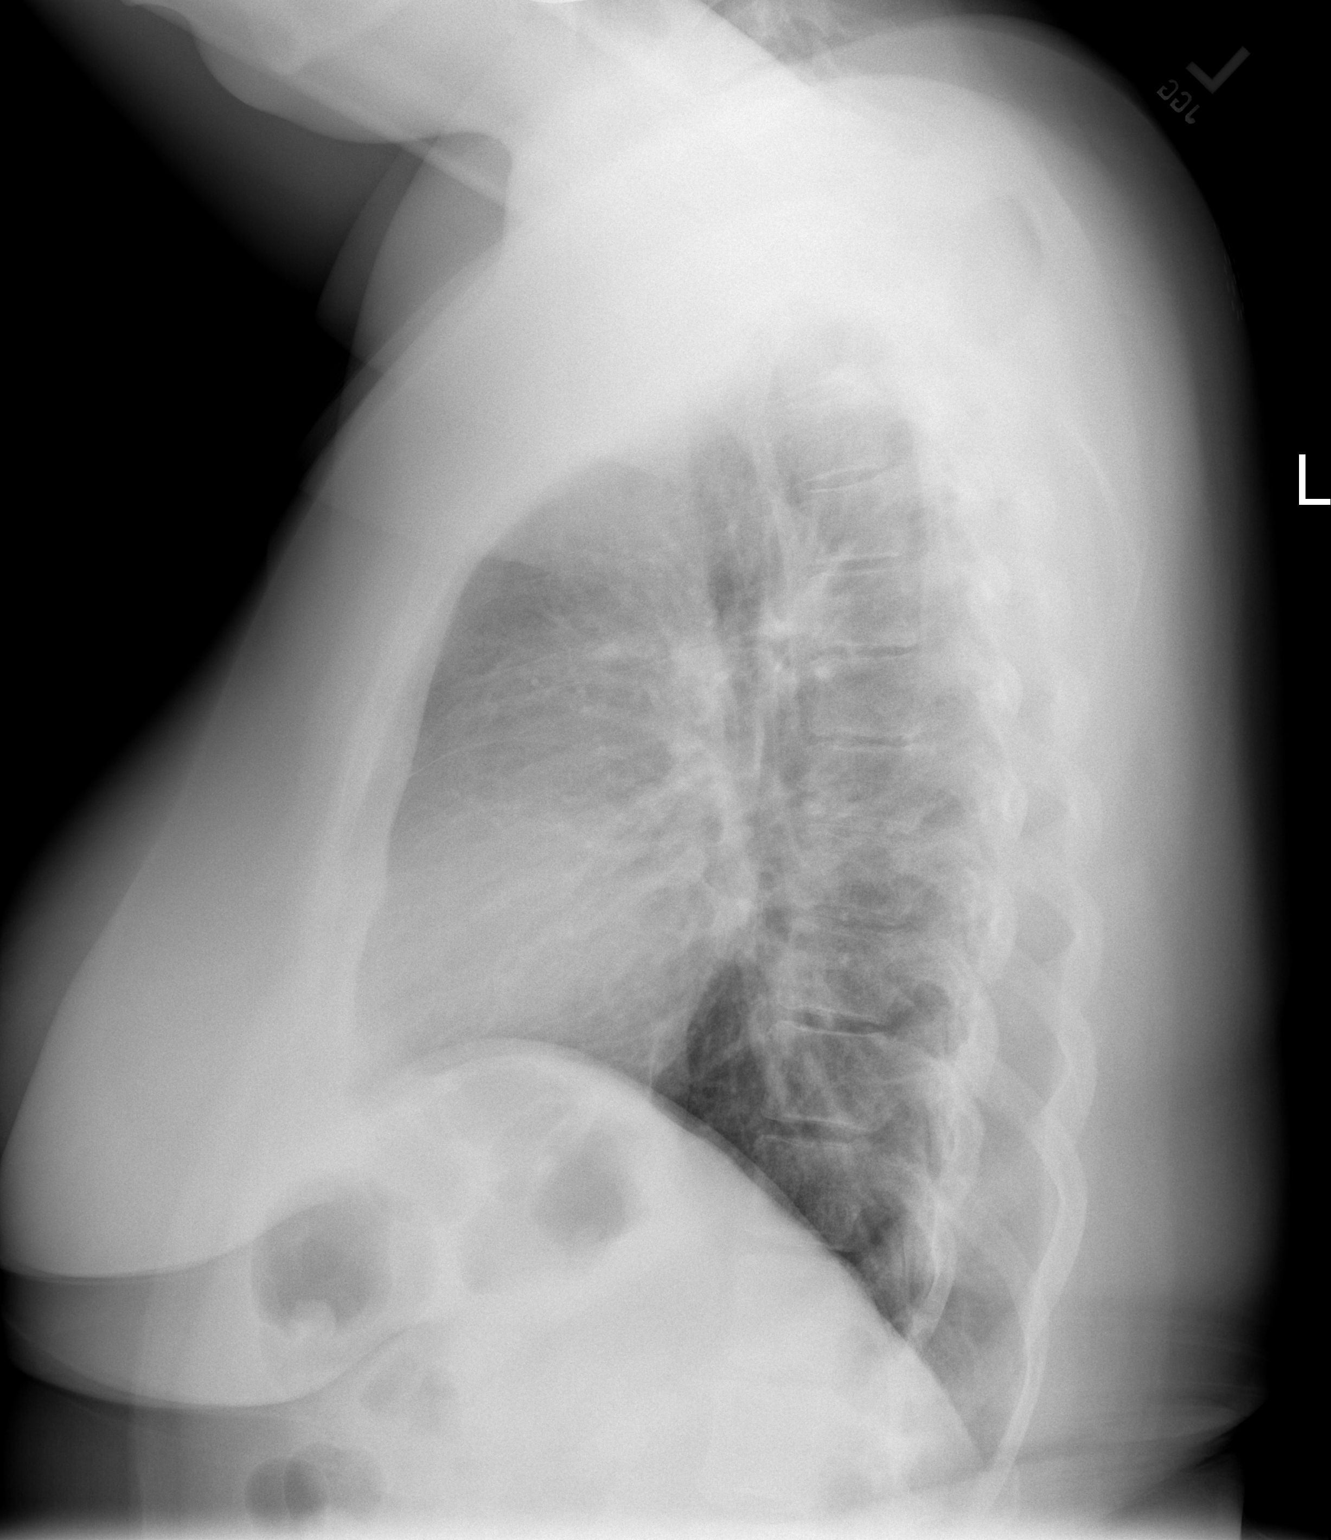

[2 of 2 positions shown; findings below may reference images not displayed]

FINDINGS: Grossly unchanged cardiac silhouette and mediastinal contours.
Veiling opacities overlying the bilateral lower lungs are favored to
represent overlying breast tissue. No focal airspace opacities. No
pleural effusion or pneumothorax. No evidence of edema. No acute
osseus abnormalities. Regional soft tissues are normal.
IMPRESSION: No acute cardiopulmonary disease.

## 2013-04-24 MED ORDER — OXYCODONE-ACETAMINOPHEN 5-325 MG PO TABS
1.0000 | ORAL_TABLET | Freq: Once | ORAL | Status: AC
  Administered 2013-04-24: 1 via ORAL

## 2013-04-24 MED ORDER — IBUPROFEN 800 MG PO TABS
800.0000 mg | ORAL_TABLET | Freq: Once | ORAL | Status: AC
  Administered 2013-04-24: 800 mg via ORAL
  Filled 2013-04-24: qty 1

## 2013-04-24 MED ORDER — CYCLOBENZAPRINE HCL 10 MG PO TABS
10.0000 mg | ORAL_TABLET | Freq: Once | ORAL | Status: AC
  Administered 2013-04-24: 10 mg via ORAL
  Filled 2013-04-24: qty 1

## 2013-04-24 MED ORDER — OXYCODONE-ACETAMINOPHEN 5-325 MG PO TABS
ORAL_TABLET | ORAL | Status: AC
  Administered 2013-04-24: 1 via ORAL
  Filled 2013-04-24: qty 1

## 2013-04-24 MED ORDER — OXYCODONE-ACETAMINOPHEN 5-325 MG PO TABS: 1.0000 | ORAL_TABLET | Freq: Four times a day (QID) | ORAL | Status: DC | PRN

## 2013-04-24 MED ORDER — CYCLOBENZAPRINE HCL 10 MG PO TABS: 10.0000 mg | ORAL_TABLET | Freq: Three times a day (TID) | ORAL | Status: DC | PRN

## 2013-04-24 NOTE — ED Provider Notes (Signed)
Medical screening examination/treatment/procedure(s) were performed by non-physician practitioner and as supervising physician I was immediately available for consultation/collaboration.   EKG Interpretation None        Saddie Benders. Maggie Senseney, MD 04/24/13 1513

## 2013-04-24 NOTE — ED Provider Notes (Signed)
CSN: [id number]     Arrival date & time 04/24/13  1200 History   First MD Initiated Contact with Patient 04/24/13 1236     Chief Complaint  Patient presents with  . Back Pain     (Consider location/radiation/quality/duration/timing/severity/associated sxs/prior Treatment) Patient is a 33 y.o. female presenting with back pain. The history is provided by the patient. No language interpreter was used.  Back Pain Location:  Thoracic spine Quality:  Stabbing and cramping Pain severity:  Moderate Duration:  1 week Associated symptoms: no abdominal pain, no chest pain and no fever   Associated symptoms comment:  Upper back pain for the past one week, persistent and worsening. It is located along the right paraspinal area. Moving and deep breathing causes the pain to recur. Early in the week she had right flank and side pain and attributed that to recent work-outs. This pain got better with rest and is now resolved. At the same time, the pain in her upper back started and it has been persistent. No abdominal pain, nausea, fever, cough.   Past Medical History  Diagnosis Date  . Mental disorder     Currently depressed  . PONV (postoperative nausea and vomiting)   . Hyperlipidemia     borderline - diet controlled  . Anxiety   . Depression   . Panic attack   . Anemia   . History of blood transfusion 06/2002    [facility] - unsure of number of units   Past Surgical History  Procedure Laterality Date  . Cesarean section  2002, 2004, 2006    x 3  . Tubal ligation    . Robotic assisted total hysterectomy N/A 09/25/2012    Procedure: ROBOTIC ASSISTED TOTAL HYSTERECTOMY;  Surgeon: Lahoma Crocker, MD;  Location: [place] [facility] [facility];  Service: Gynecology;  Laterality: N/A;  . Bilateral salpingectomy N/A 09/25/2012    Procedure: BILATERAL SALPINGECTOMY;  Surgeon: Lahoma Crocker, MD;  Location: [place] [facility];  Service: Gynecology;  Laterality: N/A;   Family History  Problem Relation Age of Onset  . Heart disease  Other   . Hypertension Other    History  Substance Use Topics  . Smoking status: Current Some Day Smoker -- 0.20 packs/day for .5 years    Types: Cigarettes  . Smokeless tobacco: Never Used     Comment: trying to quit  . Alcohol Use: No   OB History   Grav Para Term Preterm Abortions TAB SAB Ect Mult Living   3 3 3       3      Review of Systems  Constitutional: Negative for fever.  Respiratory: Negative for cough and shortness of breath.        No SOB, but she complains of pain in upper right back with respiration.  Cardiovascular: Negative for chest pain.  Gastrointestinal: Negative for nausea and abdominal pain.  Musculoskeletal: Positive for back pain. Negative for neck pain.  Skin: Negative for color change.      Allergies  Review of patient's allergies indicates no known allergies.  Home Medications   Current Outpatient Rx  Name  Route  Sig  Dispense  Refill  . ibuprofen (ADVIL,MOTRIN) 800 MG tablet   Oral   Take 800 mg by mouth every 8 (eight) hours as needed.          BP 127/74  Pulse 61  Temp(Src) 98 F (36.7 C) (Oral)  Resp 24  SpO2 98%  LMP 09/11/2012 Physical Exam  Constitutional: She is oriented to person,  place, and time. She appears well-developed and well-nourished.  Uncomfortable appearing.  HENT:  Head: Normocephalic.  Neck: Normal range of motion. Neck supple.  Cardiovascular: Normal rate and regular rhythm.   Pulmonary/Chest: Effort normal and breath sounds normal.  Abdominal: Soft. Bowel sounds are normal. There is no tenderness. There is no rebound and no guarding.  Musculoskeletal: Normal range of motion.  Right parathoracic tenderness without swelling or palpable mass or spasm.   Neurological: She is alert and oriented to person, place, and time.  Skin: Skin is warm and dry. No rash noted.  Psychiatric: She has a normal mood and affect.    ED Course  Procedures (including critical care time) Labs Review Labs Reviewed - No data  to display Imaging Review No results found. Dg Chest 2 View  04/24/2013   CLINICAL DATA:  Back pain for 1 week following going to the gym  EXAM: CHEST  2 VIEW  COMPARISON:  DG CHEST 2 VIEW dated 02/09/2013; DG CHEST 2 VIEW dated 12/10/2008  FINDINGS: Grossly unchanged cardiac silhouette and mediastinal contours. Veiling opacities overlying the bilateral lower lungs are favored to represent overlying breast tissue. No focal airspace opacities. No pleural effusion or pneumothorax. No evidence of edema. No acute osseus abnormalities. Regional soft tissues are normal.  IMPRESSION: No acute cardiopulmonary disease.   Electronically Signed   By: Sandi Mariscal M.D.   On: 04/24/2013 14:15    EKG Interpretation None      MDM   Final diagnoses:  None    1. Muscular back pain  Pain is improved with medications (percocet and flexeril). CXR negative, normal lung fields. Normal VS, no tachycardia or hypoxia. She is PERC negative. Will treat musculoskeletal pain with medications, rest.     Dewaine Oats, PA-C 04/24/13 1510

## 2013-04-24 NOTE — Discharge Instructions (Signed)

## 2013-04-24 NOTE — ED Notes (Signed)
Patient here with thoracic back pain x 1 week, reports that she has started working out and pain started after going to gym. Pain increased with any change in position, has been using heat with some relief.

## 2013-06-16 ENCOUNTER — Other Ambulatory Visit: Payer: Self-pay | Admitting: Obstetrics & Gynecology

## 2013-06-24 ENCOUNTER — Other Ambulatory Visit: Payer: Self-pay | Admitting: *Deleted

## 2013-06-24 DIAGNOSIS — E669 Obesity, unspecified: Secondary | ICD-10-CM

## 2013-06-24 MED ORDER — PHENTERMINE-TOPIRAMATE ER 7.5-46 MG PO CP24: 1.0000 | ORAL_CAPSULE | Freq: Every day | ORAL | Status: DC

## 2013-06-24 MED ORDER — PHENTERMINE-TOPIRAMATE ER 3.75-23 MG PO CP24: 1.0000 | ORAL_CAPSULE | Freq: Every day | ORAL | Status: DC

## 2013-07-01 ENCOUNTER — Other Ambulatory Visit: Payer: Self-pay | Admitting: *Deleted

## 2013-07-01 DIAGNOSIS — E663 Overweight: Secondary | ICD-10-CM

## 2013-07-01 MED ORDER — NALTREXONE-BUPROPION HCL ER 8-90 MG PO TB12: 2.0000 | ORAL_TABLET | Freq: Two times a day (BID) | ORAL | Status: DC

## 2013-07-15 ENCOUNTER — Ambulatory Visit (INDEPENDENT_AMBULATORY_CARE_PROVIDER_SITE_OTHER): Payer: Medicaid Other | Admitting: Obstetrics & Gynecology

## 2013-07-15 ENCOUNTER — Encounter: Payer: Self-pay | Admitting: Obstetrics & Gynecology

## 2013-07-15 VITALS — BP 110/75 | HR 61 | Temp 98.5°F | Wt 197.0 lb

## 2013-07-15 DIAGNOSIS — R319 Hematuria, unspecified: Secondary | ICD-10-CM

## 2013-07-16 LAB — URINALYSIS, ROUTINE W REFLEX MICROSCOPIC
Bilirubin Urine: NEGATIVE
Glucose, UA: NEGATIVE mg/dL
KETONES UR: NEGATIVE mg/dL
Leukocytes, UA: NEGATIVE
NITRITE: NEGATIVE
PROTEIN: NEGATIVE mg/dL
Specific Gravity, Urine: 1.021 (ref 1.005–1.030)
UROBILINOGEN UA: 0.2 mg/dL (ref 0.0–1.0)
pH: 5.5 (ref 5.0–8.0)

## 2013-07-16 LAB — URINALYSIS, MICROSCOPIC ONLY
Bacteria, UA: NONE SEEN
Casts: NONE SEEN
Crystals: NONE SEEN
Squamous Epithelial / LPF: NONE SEEN

## 2013-07-17 LAB — URINE CULTURE
Colony Count: NO GROWTH
Organism ID, Bacteria: NO GROWTH

## 2013-07-18 NOTE — Progress Notes (Signed)
Patient ID: Caroline Carlson, female   DOB: [birth year], 33 y.o.   MRN: [id number]  Chief Complaint  Patient presents with  . For F/U    HPI Caroline Carlson is a 33 y.o. female.  C/O left groin pain. This pain is exacerbated by exertion. HPI  Past Medical History  Diagnosis Date  . Mental disorder     Currently depressed  . PONV (postoperative nausea and vomiting)   . Hyperlipidemia     borderline - diet controlled  . Anxiety   . Depression   . Panic attack   . Anemia   . History of blood transfusion 06/2002    [facility] - unsure of number of units    Past Surgical History  Procedure Laterality Date  . Cesarean section  2002, 2004, 2006    x 3  . Tubal ligation    . Robotic assisted total hysterectomy N/A 09/25/2012    Procedure: ROBOTIC ASSISTED TOTAL HYSTERECTOMY;  Surgeon: Lahoma Crocker, MD;  Location: [place] [facility];  Service: Gynecology;  Laterality: N/A;  . Bilateral salpingectomy N/A 09/25/2012    Procedure: BILATERAL SALPINGECTOMY;  Surgeon: Lahoma Crocker, MD;  Location: [place] [facility];  Service: Gynecology;  Laterality: N/A;    Family History  Problem Relation Age of Onset  . Heart disease Other   . Hypertension Other     Social History History  Substance Use Topics  . Smoking status: Former Smoker -- 0.20 packs/day for .5 years    Types: Cigarettes  . Smokeless tobacco: Never Used     Comment: trying to quit  . Alcohol Use: No    No Known Allergies  Current Outpatient Prescriptions  Medication Sig Dispense Refill  . ibuprofen (ADVIL,MOTRIN) 800 MG tablet Take 800 mg by mouth every 8 (eight) hours as needed.       No current facility-administered medications for this visit.    Review of Systems Review of Systems Constitutional: negative for fatigue and weight loss Respiratory: negative for cough and wheezing Cardiovascular: negative for chest pain, fatigue and palpitations Gastrointestinal: positive for abdominal pain and negative for change in  bowel habits Genitourinary:negative Integument/breast: negative for nipple discharge Musculoskeletal:negative for myalgias Neurological: negative for gait problems and tremors Behavioral/Psych: negative for abusive relationship, depression Endocrine: negative for temperature intolerance     Blood pressure 110/75, pulse 61, temperature 98.5 F (36.9 C), weight 89.359 kg (197 lb), last menstrual period 09/11/2012.  Physical Exam Physical Exam General:   alert  Skin:   no rash or abnormalities  Lungs:   clear to auscultation bilaterally  Heart:   regular rate and rhythm, S1, S2 normal, no murmur, click, rub or gallop  Breasts:   normal without suspicious masses, skin or nipple changes or axillary nodes  Abdomen:  normal findings: no organomegaly, soft, non-tender and no hernia  Pelvis:  External genitalia: normal general appearance Urinary system: urethral meatus normal and bladder without fullness, nontender Vaginal: normal without tenderness, induration or masses Adnexa: normal bimanual exam     Limited pelvic U/S: Left adnexa--unilocular cystic structure 1.5 - 2 cm  Data Reviewed CT scan, labs  Assessment   ?Etiology of complaints--likely musculoskeletal in etiology ?Microscopic hematuria    Plan    Orders Placed This Encounter  Procedures  . Urine culture  . Urinalysis, Routine w reflex microscopic  . Urine Microscopic    Need to obtain previous records Possible management options include: possible further w/u for hematuria Follow up as needed or in a few months  JACKSON-MOORE,Yunuen Mordan A 07/18/2013, 11:49 AM

## 2013-07-30 NOTE — Progress Notes (Signed)
LM on VM to CB

## 2013-08-18 ENCOUNTER — Ambulatory Visit (INDEPENDENT_AMBULATORY_CARE_PROVIDER_SITE_OTHER): Payer: Medicaid Other | Admitting: Obstetrics & Gynecology

## 2013-08-18 ENCOUNTER — Encounter: Payer: Self-pay | Admitting: Obstetrics & Gynecology

## 2013-08-18 VITALS — BP 105/70 | HR 66 | Temp 97.1°F | Ht 65.0 in | Wt 196.0 lb

## 2013-08-18 DIAGNOSIS — E669 Obesity, unspecified: Secondary | ICD-10-CM

## 2013-08-18 NOTE — Progress Notes (Signed)
Patient ID: Caroline Carlson, female   DOB: September 22, 1980, 33 y.o.   MRN: [id number]  Chief Complaint  Patient presents with  . For f/u after starting medication for weight loss and to f/u hematuria    HPI Caroline Carlson is a 33 y.o. female.  The recent U/A/urine C&S were reviewed.  The U/A showed 0 - 2 RBC/HPF; the urine C&S was negative. HPI  Past Medical History  Diagnosis Date  . Mental disorder     Currently depressed  . PONV (postoperative nausea and vomiting)   . Hyperlipidemia     borderline - diet controlled  . Anxiety   . Depression   . Panic attack   . Anemia   . History of blood transfusion 06/2002    [facility] - unsure of number of units    Past Surgical History  Procedure Laterality Date  . Cesarean section  2002, 2004, 2006    x 3  . Tubal ligation    . Robotic assisted total hysterectomy N/A 09/25/2012    Procedure: ROBOTIC ASSISTED TOTAL HYSTERECTOMY;  Surgeon: Lahoma Crocker, MD;  Location: [place] [facility];  Service: Gynecology;  Laterality: N/A;  . Bilateral salpingectomy N/A 09/25/2012    Procedure: BILATERAL SALPINGECTOMY;  Surgeon: Lahoma Crocker, MD;  Location: [place] [facility];  Service: Gynecology;  Laterality: N/A;    Family History  Problem Relation Age of Onset  . Heart disease Other   . Hypertension Other     Social History History  Substance Use Topics  . Smoking status: Former Smoker -- 0.20 packs/day for .5 years    Types: Cigarettes  . Smokeless tobacco: Never Used     Comment: trying to quit  . Alcohol Use: No    No Known Allergies  Current Outpatient Prescriptions  Medication Sig Dispense Refill  . ibuprofen (ADVIL,MOTRIN) 800 MG tablet Take 800 mg by mouth every 8 (eight) hours as needed.      . Naltrexone-Bupropion HCl ER (CONTRAVE) 8-90 MG TB12 Take by mouth.       No current facility-administered medications for this visit.    Review of Systems Review of Systems Constitutional: negative for fatigue and weight loss Respiratory:  negative for cough and wheezing Cardiovascular: negative for chest pain, fatigue and palpitations Gastrointestinal: negative for abdominal pain and change in bowel habits Genitourinary:negative for abnormal vagina discharge Integument/breast: negative for nipple discharge Musculoskeletal:negative for myalgias Neurological: negative for gait problems and tremors Behavioral/Psych: negative for abusive relationship, depression Endocrine: negative for temperature intolerance     Blood pressure 105/70, pulse 66, temperature 97.1 F (36.2 C), height 5\' 5"  (1.651 m), weight 88.905 kg (196 lb), last menstrual period 09/11/2012.  Physical Exam Physical Exam General:   alert  Skin:   no rash or abnormalities  Lungs:   clear to auscultation bilaterally  Heart:   regular rate and rhythm, S1, S2 normal, no murmur, click, rub or gallop  Abdomen:  normal findings: no organomegaly, soft, non-tender and no hernia      Data Reviewed Labs  Assessment    Hematuria resolved Overweight on weight loss medication     Plan    Continue efforts/medication for weight loss Meds ordered this encounter  Medications  . Naltrexone-Bupropion HCl ER (CONTRAVE) 8-90 MG TB12    Sig: Take by mouth.     Follow up in a few months         JACKSON-MOORE,Yosselin Zoeller A 08/18/2013, 9:18 PM

## 2013-10-18 ENCOUNTER — Ambulatory Visit: Payer: Medicaid Other | Admitting: Obstetrics & Gynecology

## 2013-10-20 ENCOUNTER — Ambulatory Visit (INDEPENDENT_AMBULATORY_CARE_PROVIDER_SITE_OTHER): Payer: Medicaid Other | Admitting: Obstetrics & Gynecology

## 2013-10-20 ENCOUNTER — Telehealth: Payer: Self-pay

## 2013-10-20 ENCOUNTER — Encounter: Payer: Self-pay | Admitting: Obstetrics & Gynecology

## 2013-10-20 VITALS — BP 111/65 | HR 78 | Temp 97.0°F | Ht 65.0 in | Wt 198.0 lb

## 2013-10-20 DIAGNOSIS — D500 Iron deficiency anemia secondary to blood loss (chronic): Secondary | ICD-10-CM

## 2013-10-20 DIAGNOSIS — E669 Obesity, unspecified: Secondary | ICD-10-CM

## 2013-10-20 DIAGNOSIS — Z0000 Encounter for general adult medical examination without abnormal findings: Secondary | ICD-10-CM

## 2013-10-20 MED ORDER — PHENTERMINE-TOPIRAMATE ER 7.5-46 MG PO CP24: 7.5000 | ORAL_CAPSULE | Freq: Every day | ORAL | Status: DC

## 2013-10-20 MED ORDER — PHENTERMINE-TOPIRAMATE ER 3.75-23 MG PO CP24: 3.7500 | ORAL_CAPSULE | Freq: Every day | ORAL | Status: AC

## 2013-10-20 NOTE — Patient Instructions (Signed)

## 2013-10-20 NOTE — Telephone Encounter (Signed)
LET PATIENT KNOW ABOUT [facility] - GAVE THEM HER PHONE NUMBER AND TOLD HER TO CONTACT THEM FOR PCP - SHE HAS [facility] ON HER CARD AS WELL

## 2013-10-20 NOTE — Progress Notes (Signed)
Subjective:     Caroline Carlson is a 33 y.o. female here for a routine exam.    Personal health questionnaire:  Is patient Ashkenazi Jewish, have a family history of breast and/or ovarian cancer: no Is there a family history of uterine cancer diagnosed at age < 48, gastrointestinal cancer, urinary tract cancer, family member who is a Field seismologist syndrome-associated carrier: no Is the patient overweight and hypertensive, family history of diabetes, personal history of gestational diabetes or PCOS: yes Is patient over 53, have PCOS,  family history of premature CHD under age 66, diabetes, smoke, have hypertension or peripheral artery disease:  no At any time, has a partner hit, kicked or otherwise hurt or frightened you?: no Over the past 2 weeks, have you felt down, depressed or hopeless?: no Over the past 2 weeks, have you felt little interest or pleasure in doing things?:no   Gynecologic History Patient's last menstrual period was 09/11/2012.  Obstetric History OB History  Gravida Para Term Preterm AB SAB TAB Ectopic Multiple Living  3 3 3       3     # Outcome Date GA Lbr Len/2nd Weight Sex Delivery Anes PTL Lv  3 TRM           2 TRM           1 TRM               Past Medical History  Diagnosis Date  . Mental disorder     Currently depressed  . PONV (postoperative nausea and vomiting)   . Hyperlipidemia     borderline - diet controlled  . Anxiety   . Depression   . Panic attack   . Anemia   . History of blood transfusion 06/2002    [facility] - unsure of number of units    Past Surgical History  Procedure Laterality Date  . Cesarean section  2002, 2004, 2006    x 3  . Tubal ligation    . Robotic assisted total hysterectomy N/A 09/25/2012    Procedure: ROBOTIC ASSISTED TOTAL HYSTERECTOMY;  Surgeon: Lahoma Crocker, MD;  Location: [facility];  Service: Gynecology;  Laterality: N/A;  . Bilateral salpingectomy N/A 09/25/2012    Procedure: BILATERAL SALPINGECTOMY;  Surgeon: Lahoma Crocker, MD;  Location: [facility];  Service: Gynecology;  Laterality: N/A;    Current outpatient prescriptions:ibuprofen (ADVIL,MOTRIN) 800 MG tablet, Take 800 mg by mouth every 8 (eight) hours as needed., Disp: , Rfl: ;  Phentermine-Topiramate (QSYMIA) 3.75-23 MG CP24, Take 3.75 capsules by mouth daily., Disp: 14 capsule, Rfl: 0;  Phentermine-Topiramate (QSYMIA) 7.5-46 MG CP24, Take 7.5 capsules by mouth daily., Disp: 30 capsule, Rfl: 5 No Known Allergies  History  Substance Use Topics  . Smoking status: Former Smoker -- 0.20 packs/day for .5 years    Types: Cigarettes  . Smokeless tobacco: Never Used     Comment: trying to quit  . Alcohol Use: No    Family History  Problem Relation Age of Onset  . Heart disease Other   . Hypertension Other       Review of Systems  Constitutional: negative for fatigue and weight loss Respiratory: negative for cough and wheezing Cardiovascular: negative for chest pain, fatigue and palpitations Gastrointestinal: positive for abdominal pain and negative for change in bowel habits Musculoskeletal:negative for myalgias Neurological: negative for gait problems and tremors Behavioral/Psych: negative for abusive relationship, depression Endocrine: negative for temperature intolerance   Genitourinary:negative for abnormal menstrual periods, genital lesions,  hot flashes, sexual problems and vaginal discharge Integument/breast: negative for breast lump, breast tenderness, nipple discharge and skin lesion(s)    Objective:       BP 111/65  Pulse 78  Temp(Src) 97 F (36.1 C)  Ht 5\' 5"  (1.651 m)  Wt 89.812 kg (198 lb)  BMI 32.95 kg/m2  LMP 09/11/2012 General:   alert  Skin:   no rash or abnormalities  Lungs:   clear to auscultation bilaterally  Heart:   regular rate and rhythm, S1, S2 normal, no murmur, click, rub or gallop  Breasts:   normal without suspicious masses, skin or nipple changes or axillary nodes  Abdomen:  normal findings: no  organomegaly, soft, non-tender and no hernia  Pelvis:  External genitalia: normal general appearance Urinary system: urethral meatus normal and bladder without fullness, nontender Vaginal: normal without tenderness, induration or masses Cervix: absent Adnexa: normal bimanual exam Uterus: absent   Lab Review  Labs reviewed no Radiologic studies reviewed no    Assessment:    Healthy female exam.   ?Pelvic pain--normal exam; ?myofascial pain Did not tolerate weight loss medication Plan:    Education reviewed: calcium supplements, depression evaluation, low fat, low cholesterol diet and weight bearing exercise.   Meds ordered this encounter  Medications  . Phentermine-Topiramate (QSYMIA) 3.75-23 MG CP24    Sig: Take 3.75 capsules by mouth daily.    Dispense:  14 capsule    Refill:  0    For 2 weeks and then increase dosing to 7.5mg  daily  . Phentermine-Topiramate (QSYMIA) 7.5-46 MG CP24    Sig: Take 7.5 capsules by mouth daily.    Dispense:  30 capsule    Refill:  5   Orders Placed This Encounter  Procedures  . Comprehensive metabolic panel  . CBC  . HIV antibody  . RPR  . Hemoglobin A1c  . Ambulatory referral to [facility]    Referral Priority:  Routine    Referral Type:  Consultation    Referral Reason:  Specialty Services Required    Requested Specialty:  Family Medicine    Number of Visits Requested:  1    Follow up as needed or in 3 mths

## 2013-10-21 LAB — COMPREHENSIVE METABOLIC PANEL
ALBUMIN: 4.4 g/dL (ref 3.5–5.2)
ALT: 13 U/L (ref 0–35)
AST: 15 U/L (ref 0–37)
Alkaline Phosphatase: 71 U/L (ref 39–117)
BUN: 11 mg/dL (ref 6–23)
CO2: 24 mEq/L (ref 19–32)
Calcium: 9.4 mg/dL (ref 8.4–10.5)
Chloride: 105 mEq/L (ref 96–112)
Creat: 0.68 mg/dL (ref 0.50–1.10)
Glucose, Bld: 67 mg/dL — ABNORMAL LOW (ref 70–99)
POTASSIUM: 4.2 meq/L (ref 3.5–5.3)
SODIUM: 141 meq/L (ref 135–145)
Total Bilirubin: 0.4 mg/dL (ref 0.2–1.2)
Total Protein: 7.2 g/dL (ref 6.0–8.3)

## 2013-10-21 LAB — CBC
HCT: 40.7 % (ref 36.0–46.0)
HEMOGLOBIN: 13.7 g/dL (ref 12.0–15.0)
MCH: 30.4 pg (ref 26.0–34.0)
MCHC: 33.7 g/dL (ref 30.0–36.0)
MCV: 90.4 fL (ref 78.0–100.0)
Platelets: 323 10*3/uL (ref 150–400)
RBC: 4.5 MIL/uL (ref 3.87–5.11)
RDW: 14.9 % (ref 11.5–15.5)
WBC: 11.7 10*3/uL — ABNORMAL HIGH (ref 4.0–10.5)

## 2013-10-21 LAB — RPR

## 2013-10-21 LAB — HIV ANTIBODY (ROUTINE TESTING W REFLEX): HIV: NONREACTIVE

## 2013-10-21 LAB — HEMOGLOBIN A1C
Hgb A1c MFr Bld: 6 % — ABNORMAL HIGH (ref <5.7)
Mean Plasma Glucose: 126 mg/dL — ABNORMAL HIGH (ref <117)

## 2013-11-15 ENCOUNTER — Encounter: Payer: Self-pay | Admitting: Obstetrics & Gynecology

## 2013-12-22 ENCOUNTER — Encounter: Payer: Self-pay | Admitting: *Deleted

## 2014-01-10 ENCOUNTER — Encounter: Payer: Self-pay | Admitting: *Deleted

## 2014-01-11 ENCOUNTER — Encounter: Payer: Self-pay | Admitting: Obstetrics & Gynecology

## 2014-01-20 ENCOUNTER — Ambulatory Visit: Payer: Medicaid Other | Admitting: Obstetrics & Gynecology

## 2014-02-16 ENCOUNTER — Other Ambulatory Visit: Payer: Self-pay | Admitting: *Deleted

## 2014-02-16 ENCOUNTER — Telehealth: Payer: Self-pay

## 2014-02-16 DIAGNOSIS — Z0000 Encounter for general adult medical examination without abnormal findings: Secondary | ICD-10-CM

## 2014-02-16 NOTE — Telephone Encounter (Signed)
Patient is going to have [facility] refer her to [facility] to see Dr. Delsa Sale - she will see them tomorrow and they are on her medicaid card

## 2014-06-15 ENCOUNTER — Ambulatory Visit
Admission: RE | Admit: 2014-06-15 | Discharge: 2014-06-15 | Disposition: A | Discharge status: Home / Self Care | Payer: Medicaid Other | Source: Ambulatory Visit | Attending: Family Medicine | Admitting: Family Medicine

## 2014-06-15 ENCOUNTER — Other Ambulatory Visit: Payer: Self-pay | Admitting: Family Medicine

## 2014-06-15 DIAGNOSIS — M5442 Lumbago with sciatica, left side: Secondary | ICD-10-CM

## 2014-06-15 IMAGING — CR DG LUMBAR SPINE COMPLETE 4+V
5 series · 5 of 5 positions shown · non-contrast
Comparison: [DATE].

CLINICAL DATA: Lumbago/low back pain with radicular LEFT thigh
pain. Chronic lumbago/low back pain worsening over the last few
weeks.

EXAM:
LUMBAR SPINE - COMPLETE 4+ VIEW

[view not recorded (1 of 5)]
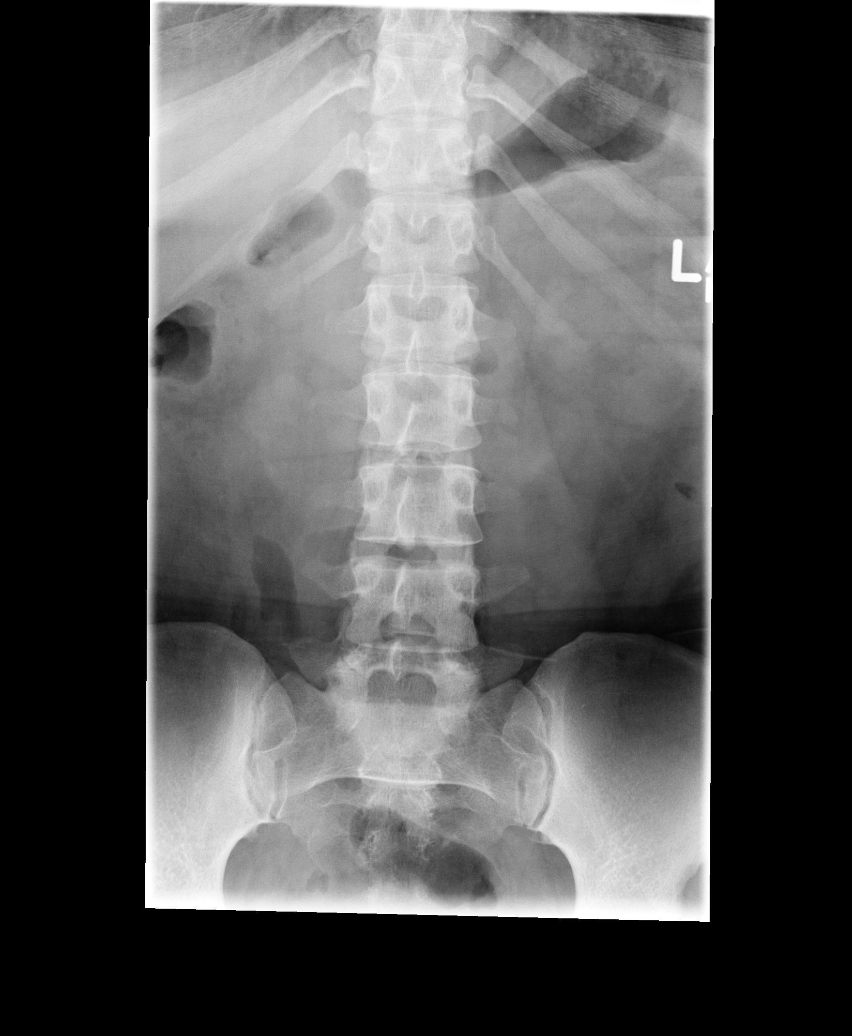

[view not recorded (2 of 5)]
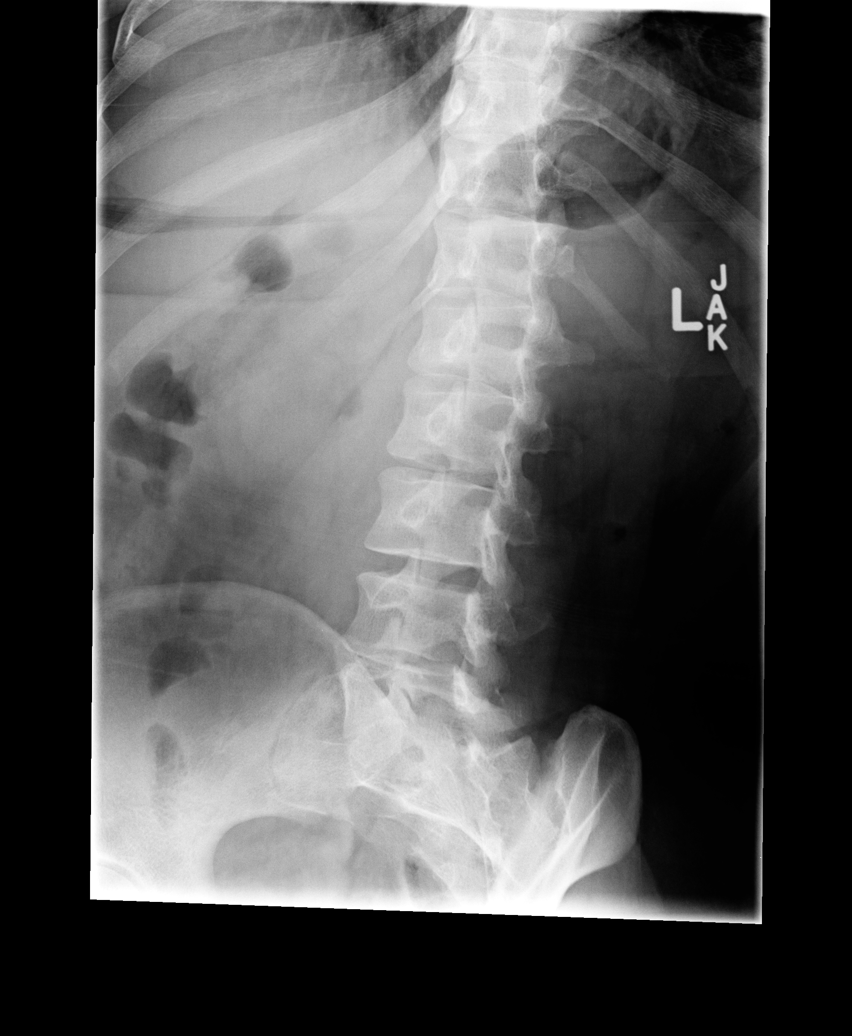

[view not recorded (3 of 5)]
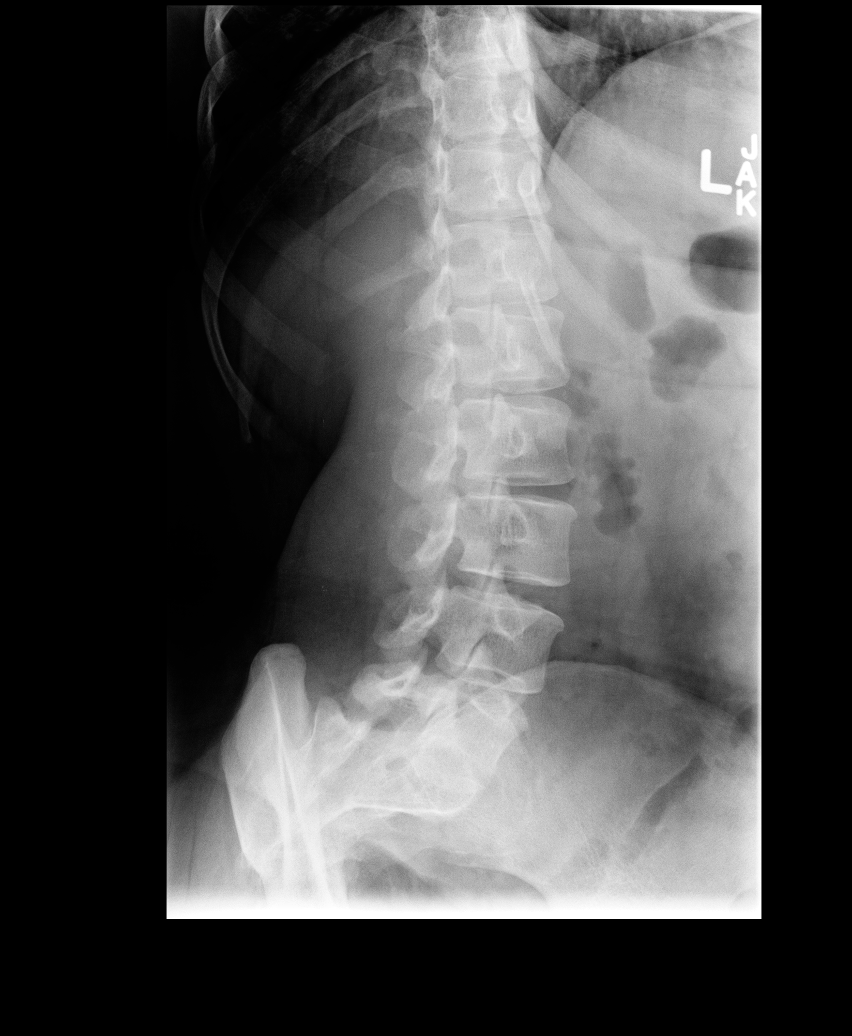

[view not recorded (4 of 5)]
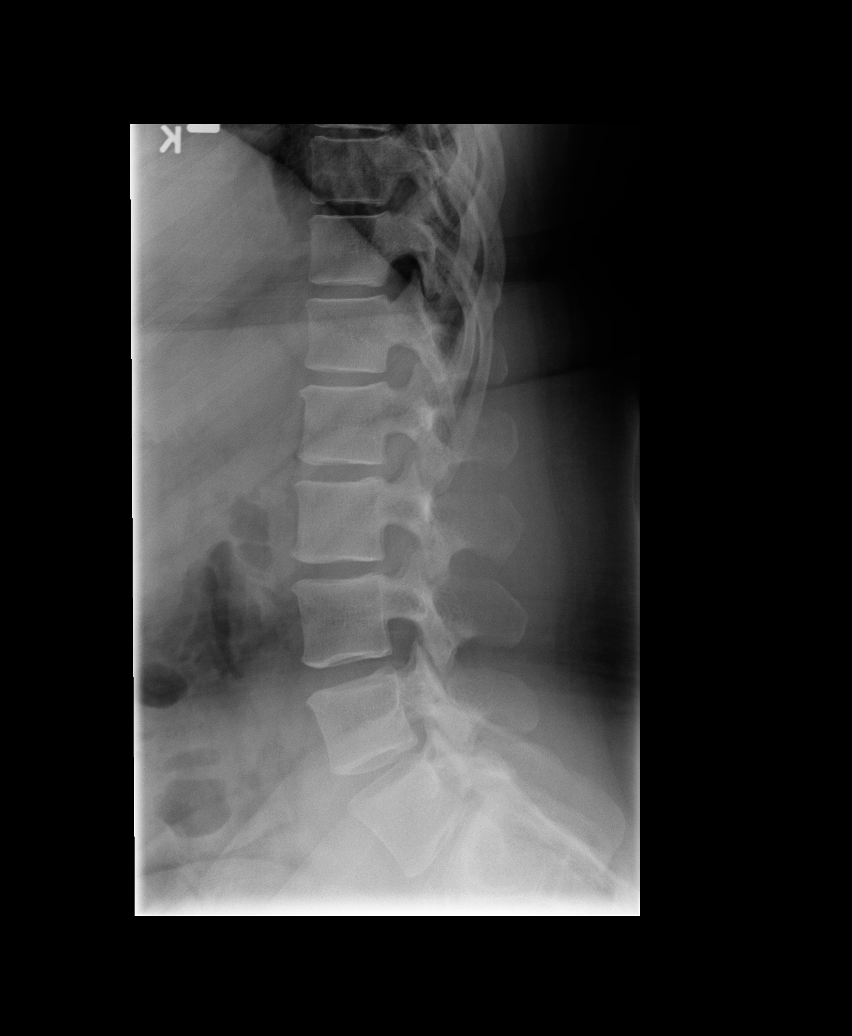

[view not recorded (5 of 5)]
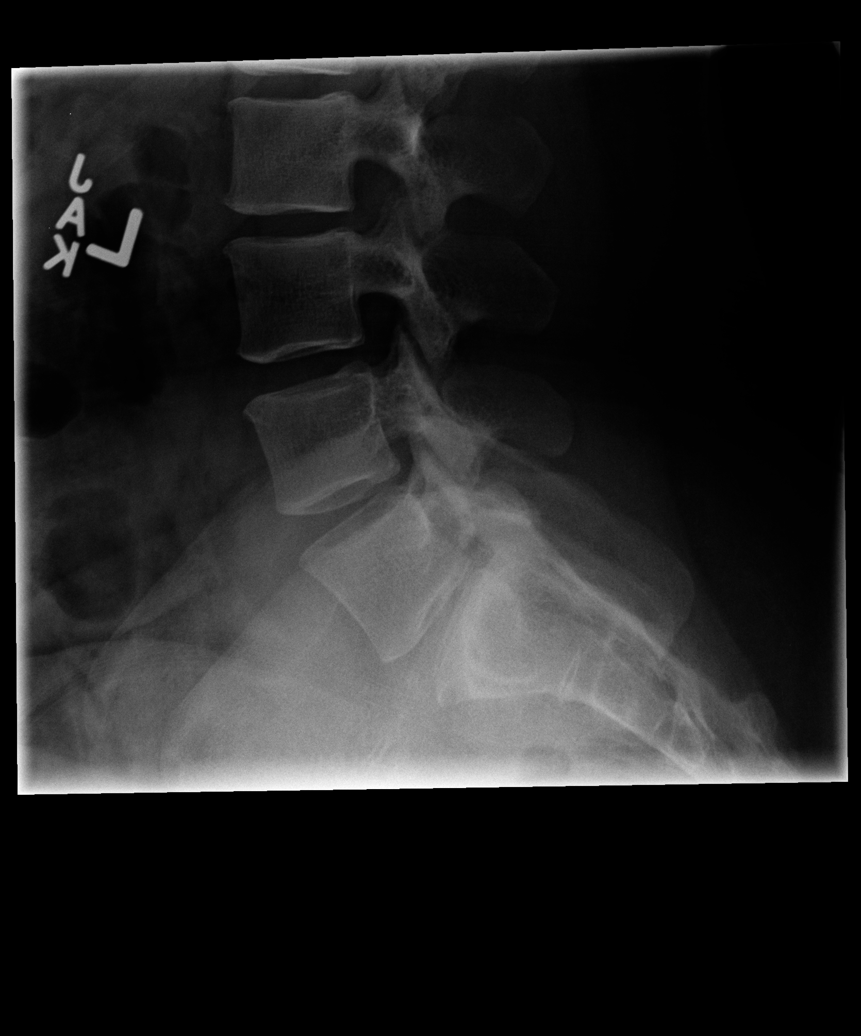

[5 of 5 positions shown; findings below may reference images not displayed]

FINDINGS: There is no evidence of lumbar spine fracture. Alignment is normal.
Intervertebral disc spaces are maintained.
IMPRESSION: Negative.

## 2015-09-14 ENCOUNTER — Emergency Department (HOSPITAL_COMMUNITY): Payer: BLUE CROSS/BLUE SHIELD

## 2015-09-14 ENCOUNTER — Encounter (HOSPITAL_COMMUNITY): Payer: Self-pay | Admitting: Emergency Medicine

## 2015-09-14 ENCOUNTER — Emergency Department (HOSPITAL_COMMUNITY)
Admission: EM | Admit: 2015-09-14 | Discharge: 2015-09-14 | Disposition: A | Discharge status: Home / Self Care | Payer: BLUE CROSS/BLUE SHIELD | Attending: Emergency Medicine | Admitting: Emergency Medicine

## 2015-09-14 DIAGNOSIS — Z79899 Other long term (current) drug therapy: Secondary | ICD-10-CM | POA: Insufficient documentation

## 2015-09-14 DIAGNOSIS — M25512 Pain in left shoulder: Secondary | ICD-10-CM

## 2015-09-14 DIAGNOSIS — Y999 Unspecified external cause status: Secondary | ICD-10-CM | POA: Insufficient documentation

## 2015-09-14 DIAGNOSIS — Y939 Activity, unspecified: Secondary | ICD-10-CM | POA: Insufficient documentation

## 2015-09-14 DIAGNOSIS — Z87891 Personal history of nicotine dependence: Secondary | ICD-10-CM | POA: Insufficient documentation

## 2015-09-14 DIAGNOSIS — S92535A Nondisplaced fracture of distal phalanx of left lesser toe(s), initial encounter for closed fracture: Secondary | ICD-10-CM | POA: Diagnosis not present

## 2015-09-14 DIAGNOSIS — S99922A Unspecified injury of left foot, initial encounter: Secondary | ICD-10-CM | POA: Diagnosis present

## 2015-09-14 DIAGNOSIS — X509XXA Other and unspecified overexertion or strenuous movements or postures, initial encounter: Secondary | ICD-10-CM | POA: Diagnosis not present

## 2015-09-14 DIAGNOSIS — Y929 Unspecified place or not applicable: Secondary | ICD-10-CM | POA: Insufficient documentation

## 2015-09-14 IMAGING — CR DG TOE 5TH 2+V*L*
3 series · 3 of 3 positions shown · non-contrast
Comparison: None.

CLINICAL DATA: Tripping injury this morning. Persistent pain and
swelling. EXAM:

DG TOE 5TH LEFT

[toe ap]
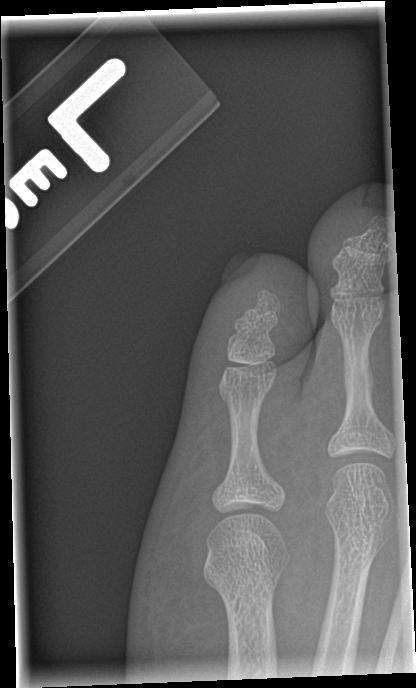

[toe obl]
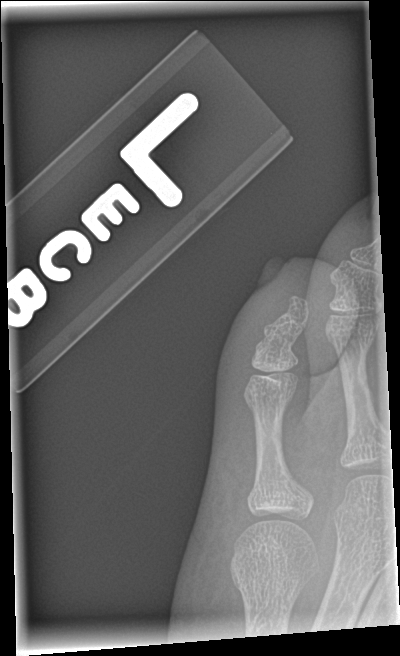

[toe lat]
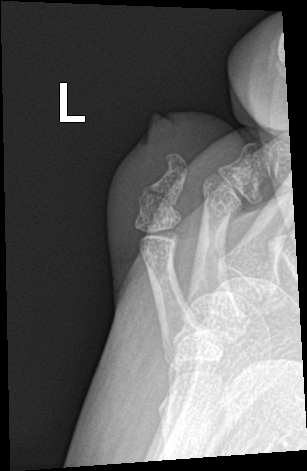

[3 of 3 positions shown; findings below may reference images not displayed]

FINDINGS: Linear lucency traversing the fifth digit may represent an acute
nondisplaced fracture superimposed on a congenital fusion of the
middle and distal phalanges. No other areas of suspicion for acute
fracture. No radiopaque foreign body. No dislocation
IMPRESSION: Suspect nondisplaced acute fracture of the fifth middle -distal
phalanx, superimposed on a congenital fusion anomaly.

## 2015-09-14 MED ORDER — NAPROXEN 500 MG PO TABS: 500.0000 mg | ORAL_TABLET | Freq: Two times a day (BID) | ORAL | 0 refills | Status: DC

## 2015-09-14 MED ORDER — METHOCARBAMOL 500 MG PO TABS: 500.0000 mg | ORAL_TABLET | Freq: Two times a day (BID) | ORAL | 0 refills | Status: DC

## 2015-09-14 NOTE — ED Triage Notes (Signed)
Pt st's she injured her left 5th toe this am.  C/O pain to area

## 2015-09-14 NOTE — ED Provider Notes (Signed)
[facility] DEPT Provider Note   CSN: [id number] Arrival date & time: 09/14/15  1951  By signing my name below, I, Irene Pap, attest that this documentation has been prepared under the direction and in the presence of Etta Quill, NP-C. Electronically Signed: Irene Pap, ED Scribe. 09/14/15. 8:24 PM.  History   Chief Complaint Chief Complaint  Patient presents with  . Toe Injury   The history is provided by the patient. No language interpreter was used.  Toe Pain  This is a new problem. The current episode started 6 to 12 hours ago. The problem occurs constantly. The problem has been gradually worsening. The symptoms are aggravated by walking. She has tried a warm compress and a cold compress for the symptoms. The treatment provided no relief.  HPI Comments: Caroline Carlson is a 35 y.o. female who presents to the Emergency Department complaining of a left fifth toe injury onset earlier this morning. Pt states that she was trying to put on a flip-flop when she bent her toe back. She felt a "pop" in the toe. She reports associated swelling to the area. She reports worsening pain with palpation and weight bearing. Pt has tried heat and ice on the area to no relief. She denies numbness or weakness.   Past Medical History:  Diagnosis Date  . Anemia   . Anxiety   . Depression   . History of blood transfusion 06/2002   [facility] - unsure of number of units  . Hyperlipidemia    borderline - diet controlled  . Mental disorder    Currently depressed  . Panic attack   . PONV (postoperative nausea and vomiting)     Patient Active Problem List   Diagnosis Date Noted  . Obesity (BMI 30.0-34.9) 08/18/2013  . Follow-up examination, following unspecified surgery 11/16/2012  . Depression 08/14/2012    Past Surgical History:  Procedure Laterality Date  . BILATERAL SALPINGECTOMY N/A 09/25/2012   Procedure: BILATERAL SALPINGECTOMY;  Surgeon: Lahoma Crocker, MD;  Location: [facility];   Service: Gynecology;  Laterality: N/A;  . CESAREAN SECTION  2002, 2004, 2006   x 3  . ROBOTIC ASSISTED TOTAL HYSTERECTOMY N/A 09/25/2012   Procedure: ROBOTIC ASSISTED TOTAL HYSTERECTOMY;  Surgeon: Lahoma Crocker, MD;  Location: [facility];  Service: Gynecology;  Laterality: N/A;  . TUBAL LIGATION      OB History    Gravida Para Term Preterm AB Living   3 3 3     3    SAB TAB Ectopic Multiple Live Births                   Home Medications    Prior to Admission medications   Medication Sig Start Date End Date Taking? Authorizing Provider  ibuprofen (ADVIL,MOTRIN) 800 MG tablet Take 800 mg by mouth every 8 (eight) hours as needed.    Historical Provider, MD  Phentermine-Topiramate (QSYMIA) 7.5-46 MG CP24 Take 7.5 capsules by mouth daily. 10/20/13   Lahoma Crocker, MD    Family History Family History  Problem Relation Age of Onset  . Heart disease Other   . Hypertension Other     Social History Social History  Substance Use Topics  . Smoking status: Former Smoker    Packs/day: 0.20    Years: 0.50    Types: Cigarettes  . Smokeless tobacco: Never Used     Comment: trying to quit  . Alcohol use No     Allergies   Review of patient's allergies indicates no  known allergies.   Review of Systems Review of Systems  Musculoskeletal: Positive for arthralgias and joint swelling.  Neurological: Negative for weakness and numbness.  All other systems reviewed and are negative.  Physical Exam Updated Vital Signs BP 119/70 (BP Location: Right Arm)   Pulse 90   Temp 98.4 F (36.9 C) (Oral)   Resp 16   Ht 5\' 5"  (1.651 m)   Wt 185 lb (83.9 kg)   LMP 09/11/2012   SpO2 99%   BMI 30.79 kg/m   Physical Exam  Constitutional: She is oriented to person, place, and time. She appears well-developed and well-nourished.  HENT:  Head: Normocephalic and atraumatic.  Eyes: Conjunctivae and EOM are normal. Pupils are equal, round, and reactive to light.  Neck: Normal range of  motion. Neck supple.  Cardiovascular: Normal rate and regular rhythm.   Pulmonary/Chest: Effort normal.  Musculoskeletal: Normal range of motion.  Discolored, swollen fifth toe of the left foot; sensation intact  Neurological: She is alert and oriented to person, place, and time.  Skin: Skin is warm and dry.  Psychiatric: She has a normal mood and affect. Her behavior is normal.  Nursing note and vitals reviewed.    ED Treatments / Results  DIAGNOSTIC STUDIES: Oxygen Saturation is 99% on RA, normal by my interpretation.    COORDINATION OF CARE: 8:23 PM-Discussed treatment plan which includes x-ray with pt at bedside and pt agreed to plan.    Labs (all labs ordered are listed, but only abnormal results are displayed) Labs Reviewed - No data to display  EKG  EKG Interpretation None       Radiology Dg Toe 5th Left  Result Date: 09/14/2015 CLINICAL DATA:  Tripping injury this morning. Persistent pain and swelling. EXAM: DG TOE 5TH LEFT COMPARISON:  None. 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 No other areas of suspicion for acute fracture. No radiopaque foreign body. 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 Electronically Signed   By: Andreas Newport M.D.   On: 09/14/2015 21:09    Procedures Procedures (including critical care time)  Medications Ordered in ED Medications - No data to display   Initial Impression / Assessment and Plan / ED Course  I have reviewed the triage vital signs and the nursing notes.  Pertinent labs & imaging results that were available during my care of the patient were reviewed by me and considered in my medical decision making (see chart for details).  Clinical Course      Final Clinical Impressions(s) / ED  Diagnoses   Patient X-Ray with nondisplaced acute fracture of the fifth middle -distal phalanx. Pain managed in ED. Pt advised to follow up with orthopedics if symptoms persist. Patient's toes will be buddy-taped, she will be given a post-op shoe, and conservative therapy recommended and discussed. Patient will be dc home & is agreeable with above plan.  Final diagnoses:  None  .scribe   New Prescriptions New Prescriptions   No medications on file     Etta Quill, NP 09/15/15 [id number]    Ripley Fraise, MD 09/19/15 2018

## 2015-09-14 NOTE — Progress Notes (Signed)
Orthopedic Tech Progress Note Patient Details:  Caroline Carlson 1981/01/06 [id number]  Ortho Devices Type of Ortho Device: Postop shoe/boot Ortho Device/Splint Location: LLE Ortho Device/Splint Interventions: Ordered, Application   Braulio Bosch 09/14/2015, 9:42 PM

## 2016-02-24 ENCOUNTER — Emergency Department (HOSPITAL_COMMUNITY): Payer: BLUE CROSS/BLUE SHIELD

## 2016-02-24 ENCOUNTER — Other Ambulatory Visit: Payer: Self-pay

## 2016-02-24 ENCOUNTER — Encounter (HOSPITAL_COMMUNITY): Payer: Self-pay

## 2016-02-24 ENCOUNTER — Emergency Department (HOSPITAL_COMMUNITY)
Admission: EM | Admit: 2016-02-24 | Discharge: 2016-02-24 | Disposition: A | Discharge status: Home / Self Care | Payer: BLUE CROSS/BLUE SHIELD | Attending: Emergency Medicine | Admitting: Emergency Medicine

## 2016-02-24 DIAGNOSIS — Z87891 Personal history of nicotine dependence: Secondary | ICD-10-CM | POA: Insufficient documentation

## 2016-02-24 DIAGNOSIS — M546 Pain in thoracic spine: Secondary | ICD-10-CM | POA: Diagnosis present

## 2016-02-24 DIAGNOSIS — M62830 Muscle spasm of back: Secondary | ICD-10-CM

## 2016-02-24 DIAGNOSIS — Z79899 Other long term (current) drug therapy: Secondary | ICD-10-CM | POA: Diagnosis not present

## 2016-02-24 LAB — CBC
HEMATOCRIT: 40.3 % (ref 36.0–46.0)
Hemoglobin: 13.8 g/dL (ref 12.0–15.0)
MCH: 30.9 pg (ref 26.0–34.0)
MCHC: 34.2 g/dL (ref 30.0–36.0)
MCV: 90.4 fL (ref 78.0–100.0)
Platelets: 286 10*3/uL (ref 150–400)
RBC: 4.46 MIL/uL (ref 3.87–5.11)
RDW: 13.5 % (ref 11.5–15.5)
WBC: 14.7 10*3/uL — ABNORMAL HIGH (ref 4.0–10.5)

## 2016-02-24 LAB — BASIC METABOLIC PANEL
Anion gap: 11 (ref 5–15)
BUN: 8 mg/dL (ref 6–20)
CO2: 18 mmol/L — ABNORMAL LOW (ref 22–32)
CREATININE: 0.7 mg/dL (ref 0.44–1.00)
Calcium: 9 mg/dL (ref 8.9–10.3)
Chloride: 110 mmol/L (ref 101–111)
GFR calc Af Amer: >60 mL/min (ref >60)
GFR calc non Af Amer: >60 mL/min (ref >60)
Glucose, Bld: 102 mg/dL — ABNORMAL HIGH (ref 65–99)
Potassium: 4.2 mmol/L (ref 3.5–5.1)
Sodium: 139 mmol/L (ref 135–145)

## 2016-02-24 IMAGING — DX DG CHEST 2V
2 series · 2 of 2 positions shown · non-contrast
Comparison: [DATE]

CLINICAL DATA: Sudden chest pain

EXAM:
CHEST  2 VIEW

[chest pa]
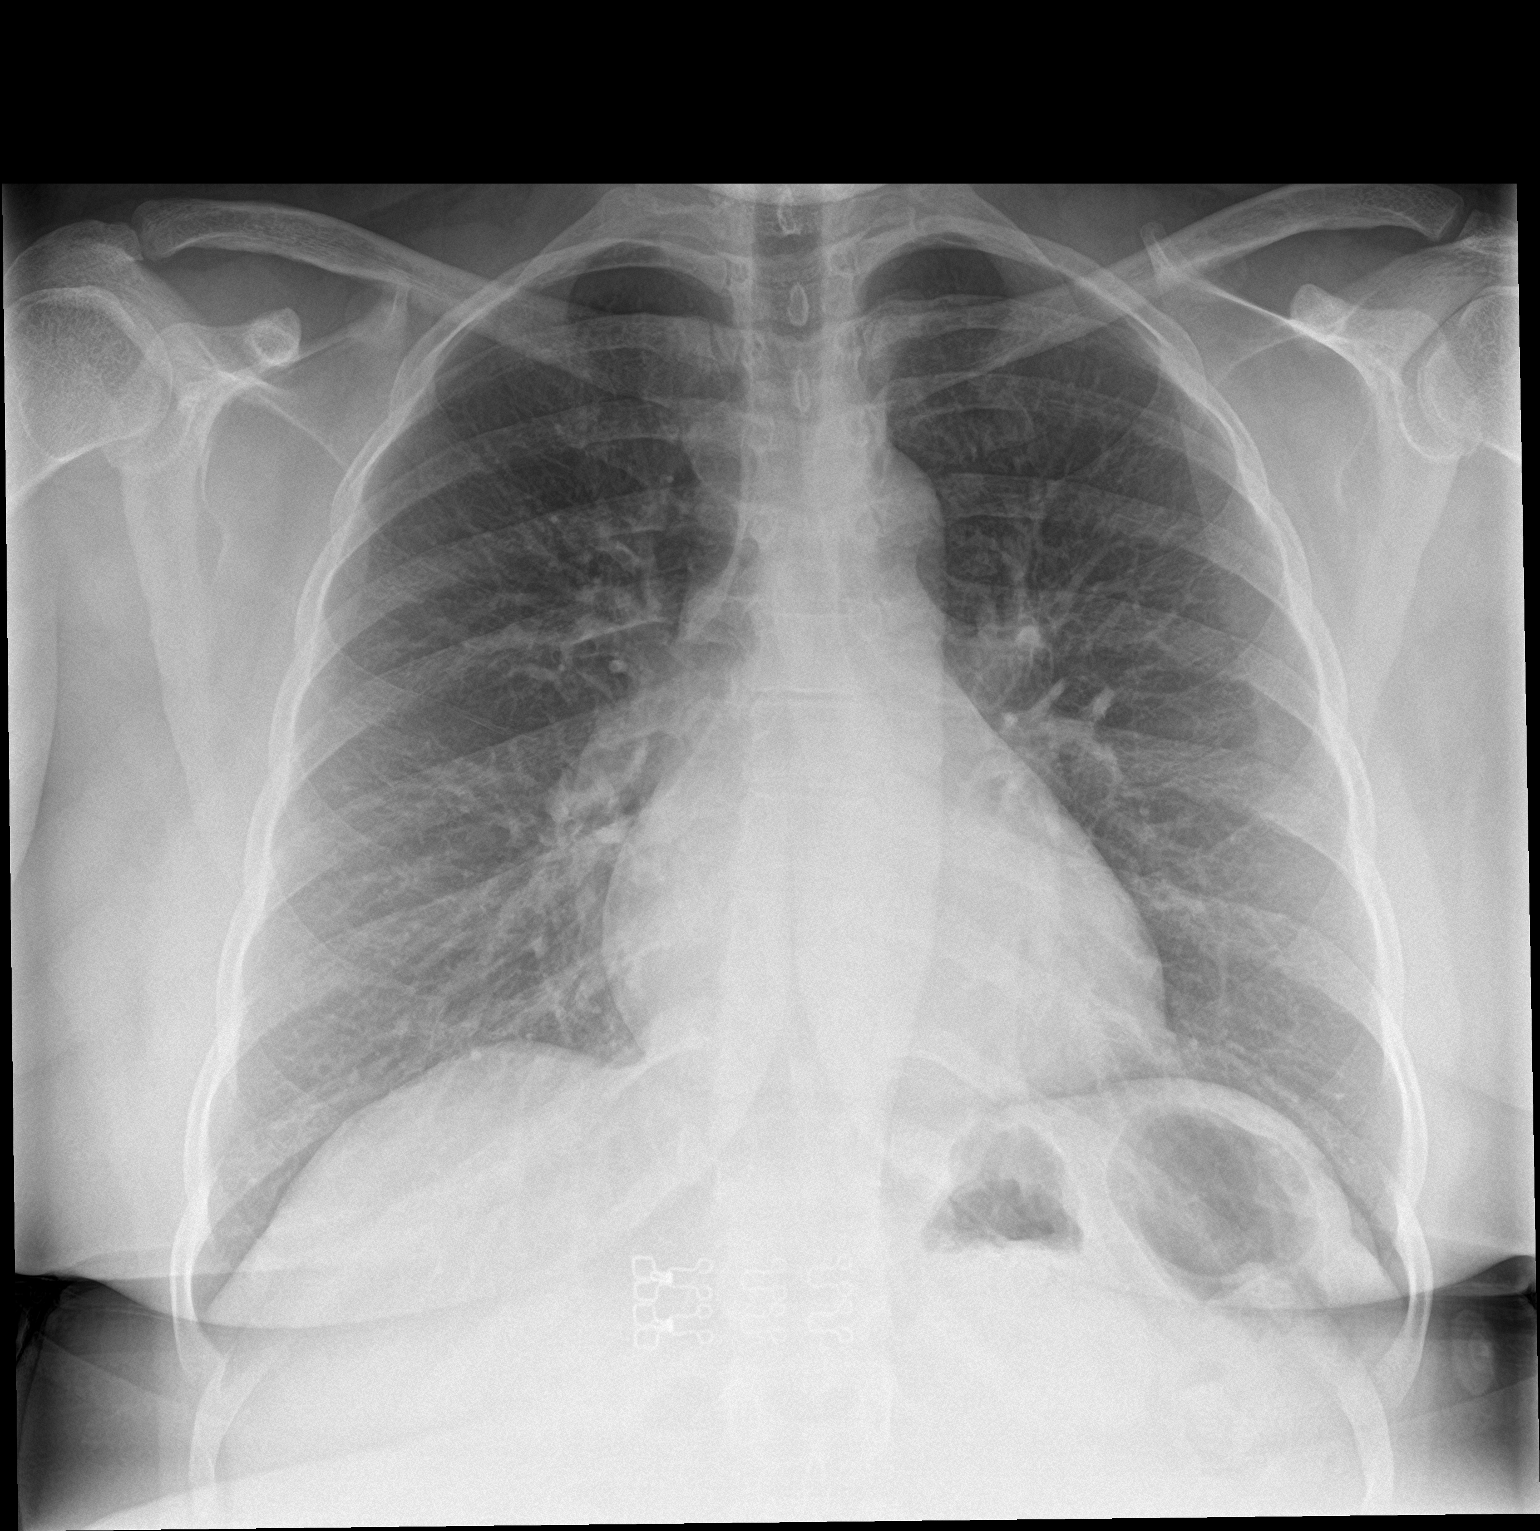

[chest lat]
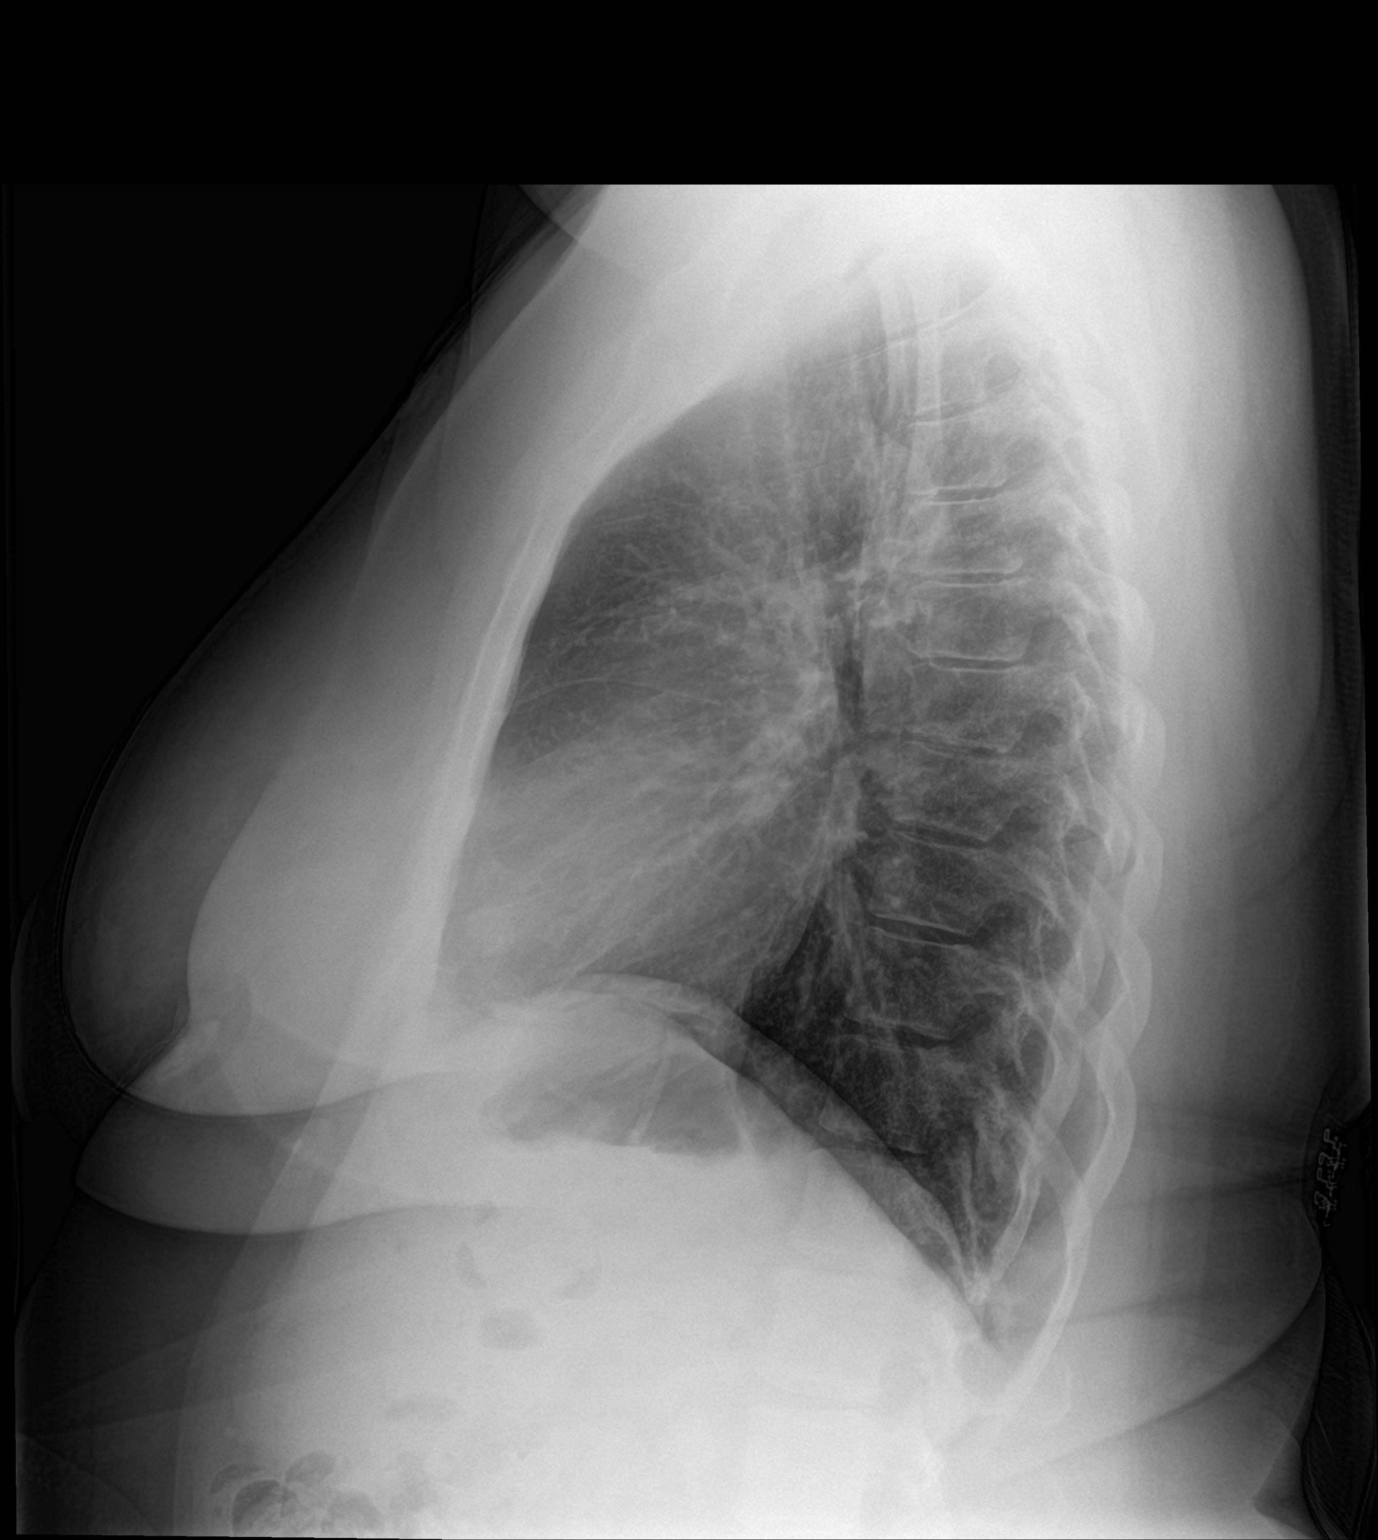

[2 of 2 positions shown; findings below may reference images not displayed]

FINDINGS: The heart size and mediastinal contours are within normal limits.
Both lungs are clear. The visualized skeletal structures are
unremarkable.
IMPRESSION: No active cardiopulmonary disease.

## 2016-02-24 MED ORDER — BUPIVACAINE HCL (PF) 0.5 % IJ SOLN
10.0000 mL | Freq: Once | INTRAMUSCULAR | Status: AC
  Administered 2016-02-24: 10 mL
  Filled 2016-02-24: qty 10

## 2016-02-24 MED ORDER — ACETAMINOPHEN 500 MG PO TABS
1000.0000 mg | ORAL_TABLET | Freq: Once | ORAL | Status: AC
  Administered 2016-02-24: 1000 mg via ORAL
  Filled 2016-02-24: qty 2

## 2016-02-24 NOTE — ED Provider Notes (Signed)
[place] DEPT Provider Note   CSN: [id number] Arrival date & time: 02/24/16  1641     History   Chief Complaint Chief Complaint  Patient presents with  . Back Pain  . Chest Pain    HPI Caroline Carlson is a 36 y.o. female.  The history is provided by the patient.  Back Pain   This is a new problem. The current episode started 1 to 2 hours ago. The problem occurs constantly. The problem has not changed since onset.The pain is associated with no known injury. The pain is present in the thoracic spine. The quality of the pain is described as stabbing and shooting. Radiates to: neck. The pain is moderate. The symptoms are aggravated by twisting, bending and certain positions. The pain is the same all the time. Pertinent negatives include no fever, no bowel incontinence, no bladder incontinence, no paresthesias, no paresis and no weakness. She has tried NSAIDs for the symptoms. The treatment provided no relief. Risk factors include obesity and lack of exercise.    Past Medical History:  Diagnosis Date  . Anemia   . Anxiety   . Depression   . History of blood transfusion 06/2002   [facility] - unsure of number of units  . Hyperlipidemia    borderline - diet controlled  . Mental disorder    Currently depressed  . Panic attack   . PONV (postoperative nausea and vomiting)     Patient Active Problem List   Diagnosis Date Noted  . Obesity (BMI 30.0-34.9) 08/18/2013  . Follow-up examination, following unspecified surgery 11/16/2012  . Depression 08/14/2012    Past Surgical History:  Procedure Laterality Date  . BILATERAL SALPINGECTOMY N/A 09/25/2012   Procedure: BILATERAL SALPINGECTOMY;  Surgeon: Lahoma Crocker, MD;  Location: [facility];  Service: Gynecology;  Laterality: N/A;  . CESAREAN SECTION  2002, 2004, 2006   x 3  . ROBOTIC ASSISTED TOTAL HYSTERECTOMY N/A 09/25/2012   Procedure: ROBOTIC ASSISTED TOTAL HYSTERECTOMY;  Surgeon: Lahoma Crocker, MD;  Location: [facility];   Service: Gynecology;  Laterality: N/A;  . TUBAL LIGATION      OB History    Gravida Para Term Preterm AB Living   3 3 3     3    SAB TAB Ectopic Multiple Live Births                   Home Medications    Prior to Admission medications   Medication Sig Start Date End Date Taking? Authorizing Provider  ibuprofen (ADVIL,MOTRIN) 800 MG tablet Take 800 mg by mouth every 8 (eight) hours as needed.    Historical Provider, MD  methocarbamol (ROBAXIN) 500 MG tablet Take 1 tablet (500 mg total) by mouth 2 (two) times daily. 09/14/15   Etta Quill, NP  naproxen (NAPROSYN) 500 MG tablet Take 1 tablet (500 mg total) by mouth 2 (two) times daily. 09/14/15   Etta Quill, NP  Phentermine-Topiramate (QSYMIA) 7.5-46 MG CP24 Take 7.5 capsules by mouth daily. 10/20/13   Lahoma Crocker, MD    Family History Family History  Problem Relation Age of Onset  . Heart disease Other   . Hypertension Other     Social History Social History  Substance Use Topics  . Smoking status: Former Smoker    Packs/day: 0.20    Years: 0.50    Types: Cigarettes  . Smokeless tobacco: Never Used     Comment: trying to quit  . Alcohol use No     Allergies  Patient has no known allergies.   Review of Systems Review of Systems  Constitutional: Negative for fever.  Gastrointestinal: Negative for bowel incontinence.  Genitourinary: Negative for bladder incontinence.  Musculoskeletal: Positive for back pain.  Neurological: Negative for weakness and paresthesias.  All other systems reviewed and are negative.    Physical Exam Updated Vital Signs BP 95/64 (BP Location: Right Arm)   Pulse 70   Temp 98.4 F (36.9 C) (Oral)   Resp 18   Ht 5\' 5"  (1.651 m)   Wt 189 lb (85.7 kg)   LMP 09/11/2012   SpO2 100%   BMI 31.45 kg/m   Physical Exam  Constitutional: She is oriented to person, place, and time. She appears well-developed and well-nourished. No distress.  HENT:  Head: Normocephalic.  Nose: Nose  normal.  Eyes: Conjunctivae are normal.  Neck: Neck supple. No tracheal deviation present.  Cardiovascular: Normal rate, regular rhythm and normal heart sounds.   Pulmonary/Chest: Effort normal and breath sounds normal. No respiratory distress.  Abdominal: Soft. She exhibits no distension. There is no tenderness.  Musculoskeletal:       Cervical back: She exhibits tenderness.       Thoracic back: She exhibits tenderness and pain. She exhibits no bony tenderness.       Back:  Neurological: She is alert and oriented to person, place, and time. She has normal strength. No sensory deficit. Coordination and gait normal.  Skin: Skin is warm and dry.  Psychiatric: She has a normal mood and affect.  Vitals reviewed.    ED Treatments / Results  Labs (all labs ordered are listed, but only abnormal results are displayed) Labs Reviewed  BASIC METABOLIC PANEL - Abnormal; Notable for the following:       Result Value   CO2 18 (*)    Glucose, Bld 102 (*)    All other components within normal limits  CBC - Abnormal; Notable for the following:    WBC 14.7 (*)    All other components within normal limits  I-STAT TROPOININ, ED    EKG  EKG Interpretation  Date/Time:  Saturday February 24 2016 17:34:27 EST Ventricular Rate:  76 PR Interval:  146 QRS Duration: 72 QT Interval:  366 QTC Calculation: 411 R Axis:   92 Text Interpretation:  Normal sinus rhythm Normal ECG No old tracing to compare Confirmed by Elvert Cumpton MD, Candido Flott [phone]) on 02/24/2016 7:01:21 PM       Radiology Dg Chest 2 View  Result Date: 02/24/2016 CLINICAL DATA:  Sudden chest pain EXAM: CHEST  2 VIEW COMPARISON:  04/24/2013 FINDINGS: The heart size and mediastinal contours are within normal limits. Both lungs are clear. The visualized skeletal structures are unremarkable. IMPRESSION: No active cardiopulmonary disease. Electronically Signed   By: Donavan Foil M.D.   On: 02/24/2016 18:38    Procedures Procedures (including  critical care time)  Procedure Note: Trigger Point Injection for Myofascial pain  Performed by Dr. Laneta Simmers Indication: muscle/myofascial pain Muscle body and tendon sheath of the left thoracic paraspinal muscle(s) were injected with 0.5% bupivacaine under sterile technique for release of muscle spasm/pain. Patient tolerated well with immediate improvement of symptoms and no immediate complications following procedure.  CPT Code:   1 or 2 muscle bodies: 20552    Medications Ordered in ED Medications  bupivacaine (MARCAINE) 0.5 % injection 10 mL (10 mLs Infiltration Given 02/24/16 2037)  acetaminophen (TYLENOL) tablet 1,000 mg (1,000 mg Oral Given 02/24/16 2036)     Initial Impression /  Assessment and Plan / ED Course  I have reviewed the triage vital signs and the nursing notes.  Pertinent labs & imaging results that were available during my care of the patient were reviewed by me and considered in my medical decision making (see chart for details).     37 y.o. female presents with back pain and shoulder pain that occurred while she was making a phone call earlier. She has point tenderness and reproducible pain of the left side of thoracic spine on exam. Suspect MSK pain as triage workup of intrathoracic, cardiac or metabolic/hematologic causes was unremarkable. Well appearing with no neurologic deficits. Recommended NSAIDs/tylenol and early mobility as definitive treatment. Applied local anesthesia with improvement of symptoms. Plan to follow up with PCP as needed and return precautions discussed for worsening or new concerning symptoms.    Final Clinical Impressions(s) / ED Diagnoses   Final diagnoses:  Spasm of thoracic back muscle    New Prescriptions New Prescriptions   No medications on file     Leo Grosser, MD 02/25/16 [phone]

## 2016-02-24 NOTE — ED Triage Notes (Addendum)
Pt. Was talking on the phone and developed chest pain that radiates into her back.  She had nausea with the pain at first and she feels sob.  She denies any injuries.  The pain radiates into her back up into her neck.   She denies any cold symptoms.    Movement increases the pain.  Skin is warm and dry.  Pt. Is alert and oriented X 4.  ECG completed in Triage. Pt. Appears uncomfortable in Triage

## 2016-08-04 ENCOUNTER — Encounter (HOSPITAL_COMMUNITY): Payer: Self-pay | Admitting: Emergency Medicine

## 2016-08-04 ENCOUNTER — Emergency Department (HOSPITAL_COMMUNITY)
Admission: EM | Admit: 2016-08-04 | Discharge: 2016-08-04 | Disposition: A | Discharge status: Home / Self Care | Payer: BLUE CROSS/BLUE SHIELD | Attending: Emergency Medicine | Admitting: Emergency Medicine

## 2016-08-04 ENCOUNTER — Emergency Department (HOSPITAL_COMMUNITY): Payer: BLUE CROSS/BLUE SHIELD

## 2016-08-04 DIAGNOSIS — M545 Low back pain: Secondary | ICD-10-CM | POA: Diagnosis present

## 2016-08-04 DIAGNOSIS — M62830 Muscle spasm of back: Secondary | ICD-10-CM | POA: Diagnosis not present

## 2016-08-04 DIAGNOSIS — Z79899 Other long term (current) drug therapy: Secondary | ICD-10-CM | POA: Insufficient documentation

## 2016-08-04 DIAGNOSIS — Z87891 Personal history of nicotine dependence: Secondary | ICD-10-CM | POA: Insufficient documentation

## 2016-08-04 DIAGNOSIS — R109 Unspecified abdominal pain: Secondary | ICD-10-CM | POA: Diagnosis not present

## 2016-08-04 LAB — URINALYSIS, ROUTINE W REFLEX MICROSCOPIC
Bilirubin Urine: NEGATIVE
GLUCOSE, UA: NEGATIVE mg/dL
Ketones, ur: NEGATIVE mg/dL
Nitrite: NEGATIVE
PROTEIN: NEGATIVE mg/dL
Specific Gravity, Urine: 1.023 (ref 1.005–1.030)
WBC UA: NONE SEEN WBC/hpf (ref 0–5)
pH: 5 (ref 5.0–8.0)

## 2016-08-04 IMAGING — CT CT RENAL STONE PROTOCOL
2 of 4 series · 16 of 46 positions shown, 18 images · non-contrast
Comparison: [DATE]

CLINICAL DATA: Left flank pain

EXAM:
CT ABDOMEN AND PELVIS WITHOUT CONTRAST
TECHNIQUE: Multidetector CT imaging of the abdomen and pelvis was performed
following the standard protocol without IV contrast.

[Series 3: renal stone 5.0 · axial · 0.70mm/px · z∈[+871,+1296]mm · 13 of 93 slices shown, 15 images]
[im 4/93  soft-tissue]
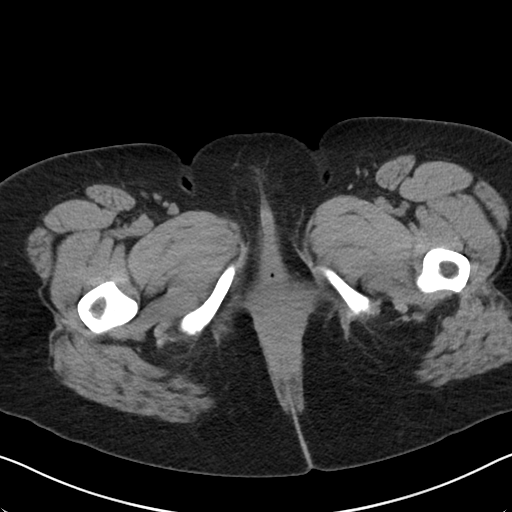
[im 4/93  bone]
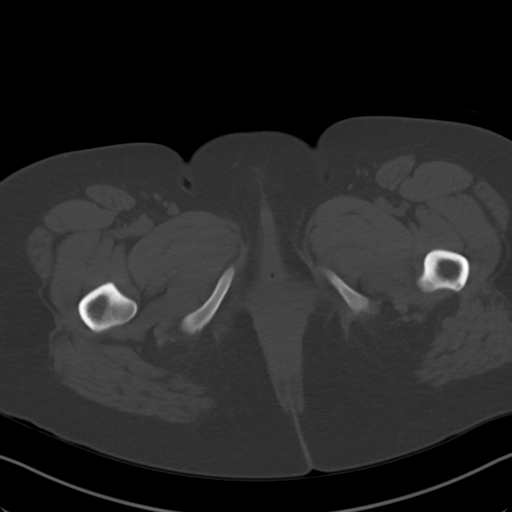
[im 12/93  soft-tissue]
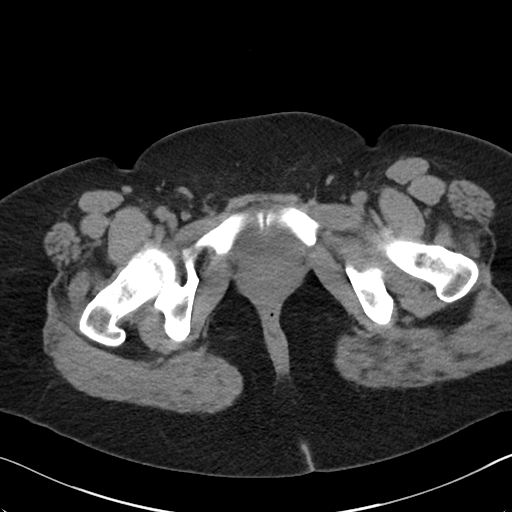
[im 20/93  soft-tissue]
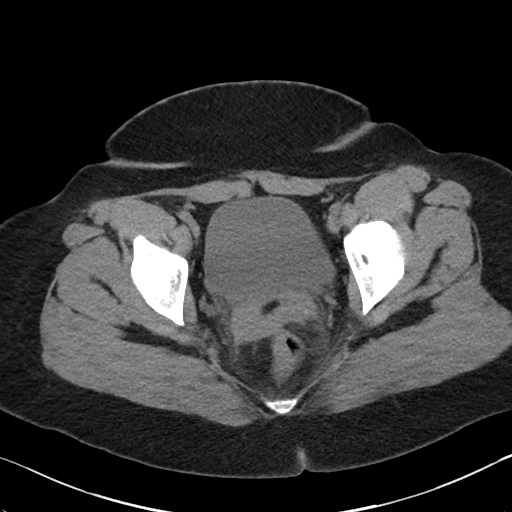
[im 27/93  soft-tissue]
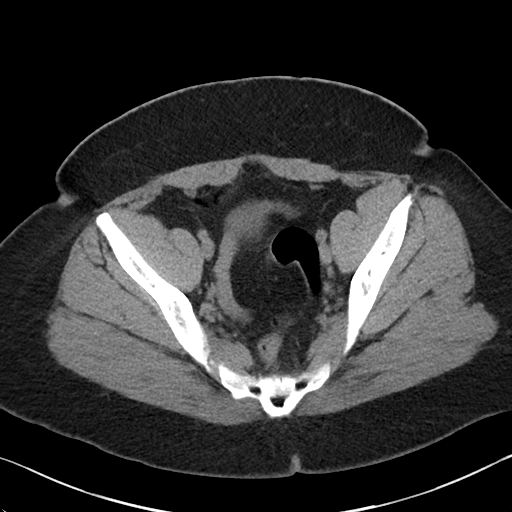
[im 31/93  soft-tissue]
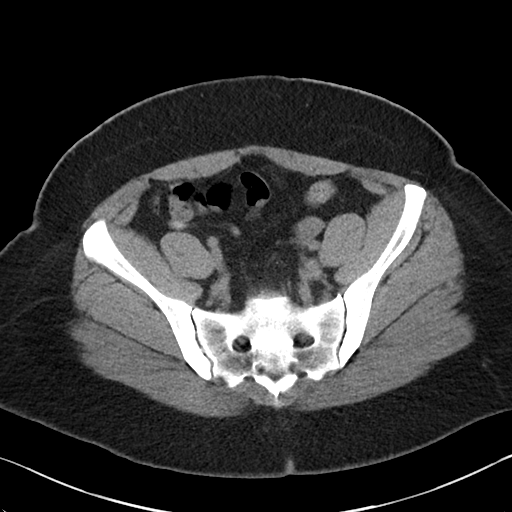
[im 39/93  soft-tissue]
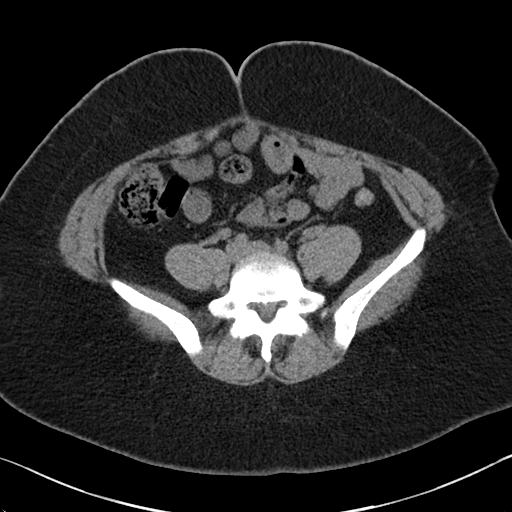
[im 47/93  soft-tissue]
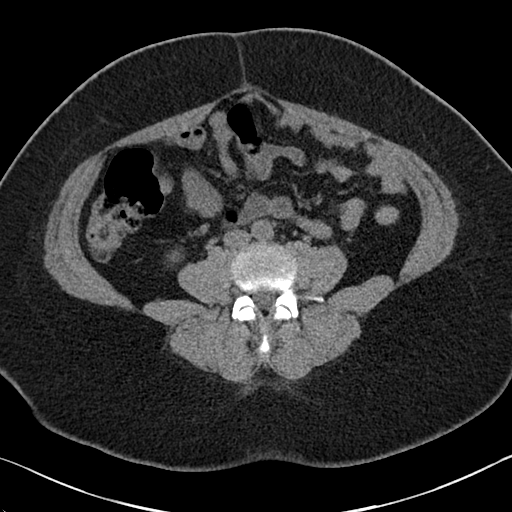
[im 54/93  soft-tissue]
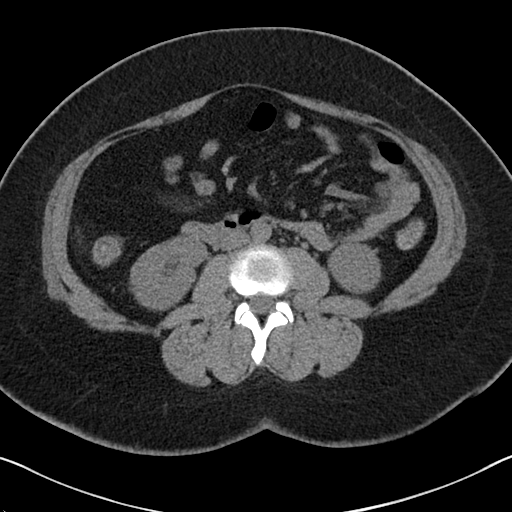
[im 62/93  soft-tissue]
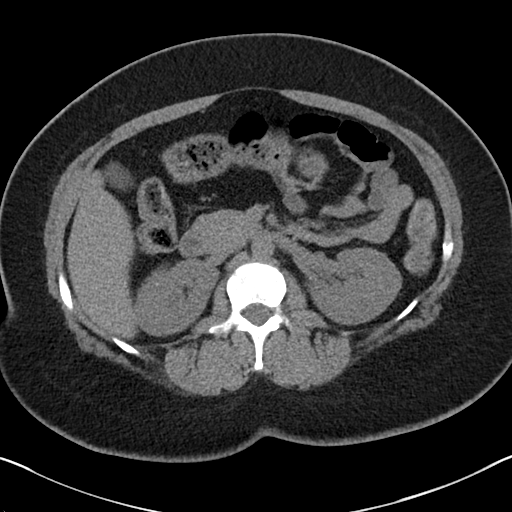
[im 62/93  bone]
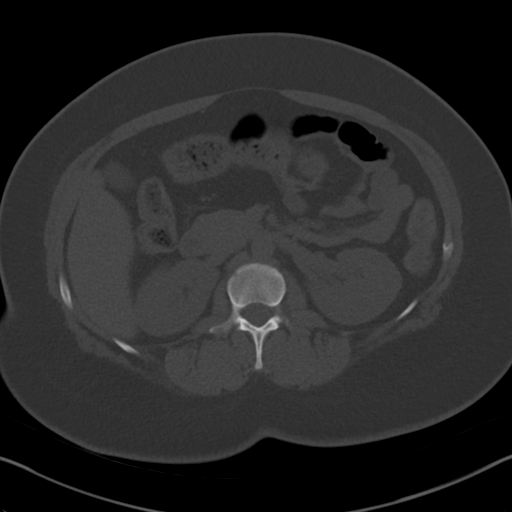
[im 66/93  soft-tissue]
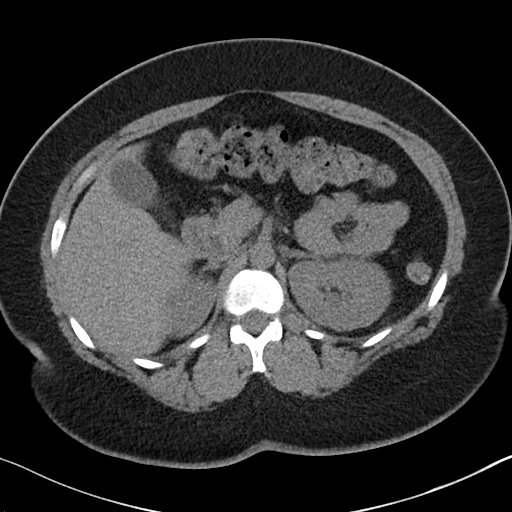
[im 73/93  soft-tissue]
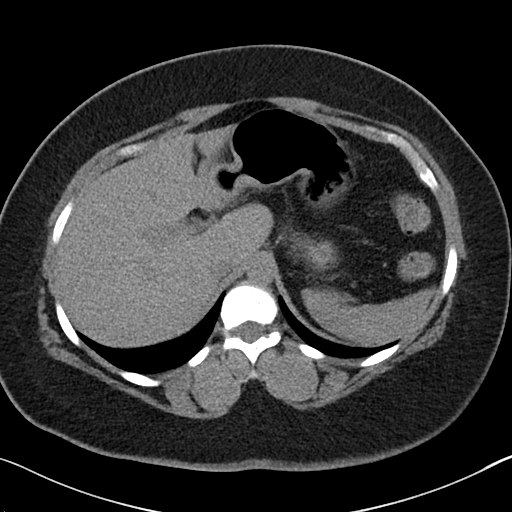
[im 81/93  soft-tissue]
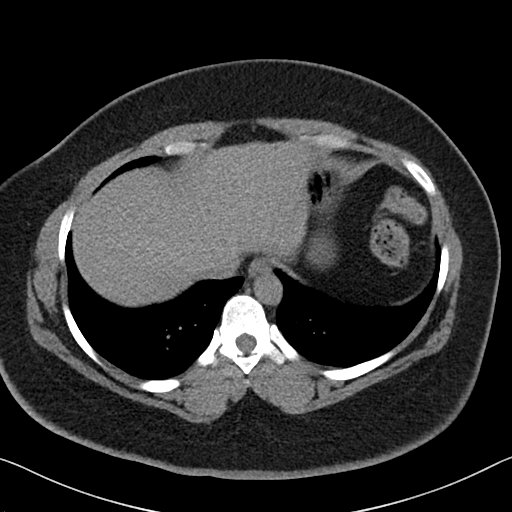
[im 89/93  soft-tissue]
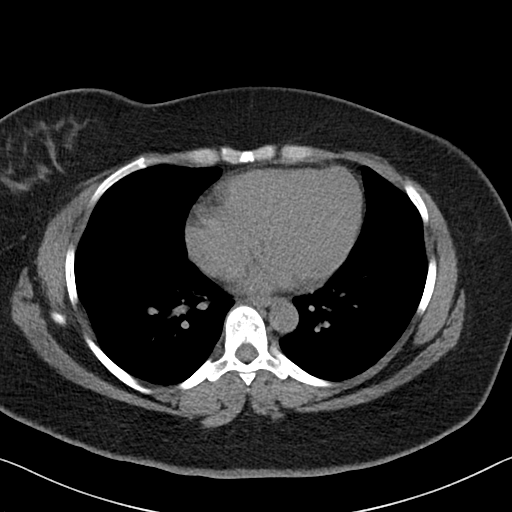

[Series 5: renal stone 3.0 cor · coronal · 0.65mm/px · 3 of 101 slices shown]
[im 34/101  soft-tissue]
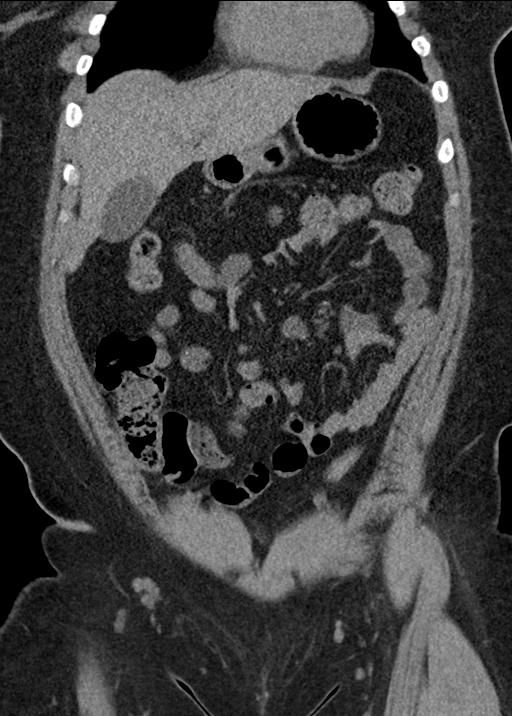
[im 45/101  soft-tissue]
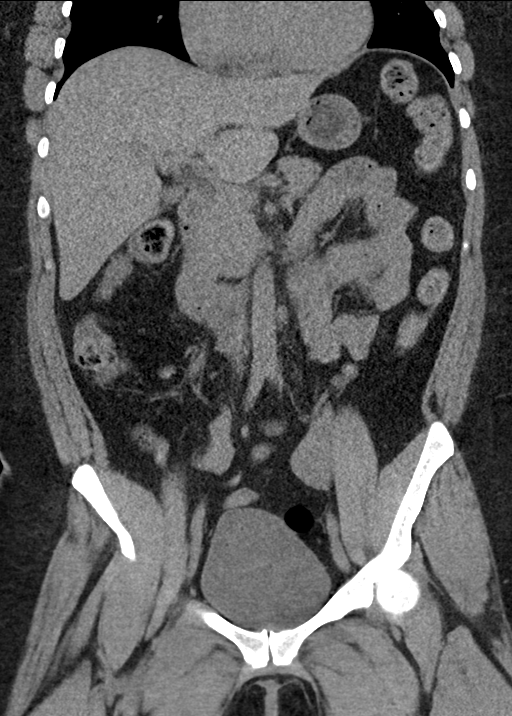
[im 56/101  soft-tissue]
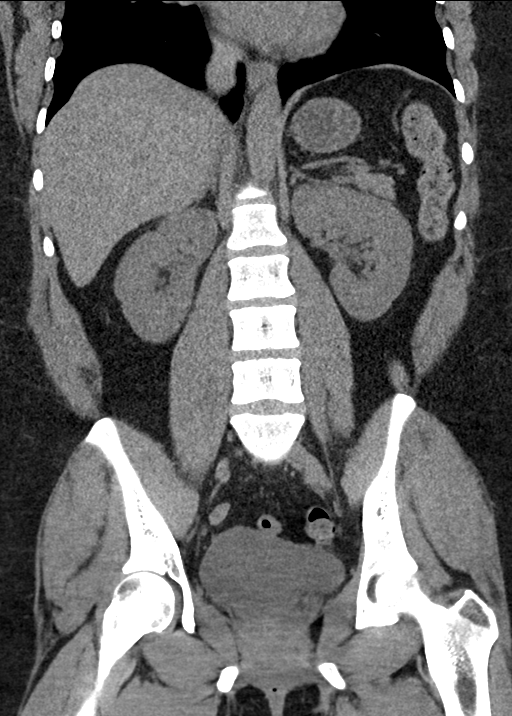

[16 of 46 positions shown; findings below may reference images not displayed]

FINDINGS: Lower chest: Lung bases are clear.

Hepatobiliary: Unenhanced liver is unremarkable.

Gallbladder is unremarkable. No intrahepatic or extrahepatic ductal
dilatation.

Pancreas: Within normal limits.

Spleen: Within normal limits.

Adrenals/Urinary Tract: Adrenal glands are within normal limits.

Kidneys are within normal limits. No renal, ureteral, or bladder
calculi. No hydronephrosis.

Bladder is within normal limits.

Stomach/Bowel: Stomach is within normal limits.

No evidence of bowel obstruction.

Normal appendix (series 3/ image 64).

Vascular/Lymphatic: No evidence of abdominal aortic aneurysm.

No suspicious abdominopelvic lymphadenopathy.

Reproductive: Status post hysterectomy.

Bilateral ovaries are within normal limits.

Other: No abdominopelvic ascites.

Musculoskeletal: Visualized osseous structures are within normal
limits.
IMPRESSION: No renal, ureteral, or bladder calculi.  No hydronephrosis.

Negative CT abdomen/ pelvis.

No CT findings to account for the patient's left flank pain.

## 2016-08-04 MED ORDER — HYDROCODONE-ACETAMINOPHEN 5-325 MG PO TABS: 1.0000 | ORAL_TABLET | Freq: Four times a day (QID) | ORAL | 0 refills | Status: DC | PRN

## 2016-08-04 MED ORDER — NAPROXEN 500 MG PO TABS: 500.0000 mg | ORAL_TABLET | Freq: Two times a day (BID) | ORAL | 0 refills | Status: DC

## 2016-08-04 MED ORDER — KETOROLAC TROMETHAMINE 60 MG/2ML IM SOLN
30.0000 mg | Freq: Once | INTRAMUSCULAR | Status: AC
  Administered 2016-08-04: 30 mg via INTRAMUSCULAR
  Filled 2016-08-04: qty 2

## 2016-08-04 MED ORDER — OXYCODONE-ACETAMINOPHEN 5-325 MG PO TABS
1.0000 | ORAL_TABLET | Freq: Once | ORAL | Status: AC
  Administered 2016-08-04: 1 via ORAL
  Filled 2016-08-04: qty 1

## 2016-08-04 MED ORDER — METHOCARBAMOL 750 MG PO TABS: 750.0000 mg | ORAL_TABLET | Freq: Two times a day (BID) | ORAL | 0 refills | Status: DC

## 2016-08-04 MED ORDER — DIAZEPAM 5 MG PO TABS
5.0000 mg | ORAL_TABLET | Freq: Once | ORAL | Status: AC
  Administered 2016-08-04: 5 mg via ORAL
  Filled 2016-08-04: qty 1

## 2016-08-04 MED ORDER — METHOCARBAMOL 1000 MG/10ML IJ SOLN
500.0000 mg | Freq: Once | INTRAMUSCULAR | Status: DC
  Filled 2016-08-04: qty 5

## 2016-08-04 NOTE — ED Provider Notes (Signed)
[facility] DEPT Provider Note   CSN: [id number] Arrival date & time: 08/04/16  1034  By signing my name below, I, Caroline Carlson, attest that this documentation has been prepared under the direction and in the presence of Zania Kalisz, PA-C.  Electronically Signed: Reola Carlson, ED Scribe. 08/04/16. 11:09 AM.  History   Chief Complaint Chief Complaint  Patient presents with  . Back Pain   The history is provided by the patient. No language interpreter was used.    HPI Comments: Caroline Carlson is a 36 y.o. female who presents to the Emergency Department complaining of gradual onset, persistent lower back pain beginning yesterday. Pt reports that yesterday morning she was routinely lifting several heavy cabinets at work; later that afternoon she had an acute onset of lower back pain. She reports that her pain is mostly localized to the left more than the right. She describes this as sharp and stabbing. Her pain is worse with leaning forwards and generalized movements. Pt took Aleve this morning and she has been applying IcyHot over her back w/o relief of her pain. No h/o renal calculi. Pt is s/p partial hysterectomy. She denies numbness, weakness, hematuria, or any other associated symptoms.   Past Medical History:  Diagnosis Date  . Anemia   . Anxiety   . Depression   . History of blood transfusion 06/2002   [facility] - unsure of number of units  . Hyperlipidemia    borderline - diet controlled  . Mental disorder    Currently depressed  . Panic attack   . PONV (postoperative nausea and vomiting)    Patient Active Problem List   Diagnosis Date Noted  . Obesity (BMI 30.0-34.9) 08/18/2013  . Follow-up examination, following unspecified surgery 11/16/2012  . Depression 08/14/2012   Past Surgical History:  Procedure Laterality Date  . BILATERAL SALPINGECTOMY N/A 09/25/2012   Procedure: BILATERAL SALPINGECTOMY;  Surgeon: Lahoma Crocker, MD;  Location: [facility];   Service: Gynecology;  Laterality: N/A;  . CESAREAN SECTION  2002, 2004, 2006   x 3  . ROBOTIC ASSISTED TOTAL HYSTERECTOMY N/A 09/25/2012   Procedure: ROBOTIC ASSISTED TOTAL HYSTERECTOMY;  Surgeon: Lahoma Crocker, MD;  Location: [facility];  Service: Gynecology;  Laterality: N/A;  . TUBAL LIGATION     OB History    Gravida Para Term Preterm AB Living   3 3 3     3    SAB TAB Ectopic Multiple Live Births                 Home Medications    Prior to Admission medications   Medication Sig Start Date End Date Taking? Authorizing Provider  ibuprofen (ADVIL,MOTRIN) 800 MG tablet Take 800 mg by mouth every 8 (eight) hours as needed.    [provider]  methocarbamol (ROBAXIN) 500 MG tablet Take 1 tablet (500 mg total) by mouth 2 (two) times daily. 09/14/15   Etta Quill, NP  naproxen (NAPROSYN) 500 MG tablet Take 1 tablet (500 mg total) by mouth 2 (two) times daily. 09/14/15   Etta Quill, NP  Phentermine-Topiramate (QSYMIA) 7.5-46 MG CP24 Take 7.5 capsules by mouth daily. 10/20/13   Lahoma Crocker, MD    Family History Family History  Problem Relation Age of Onset  . Heart disease Other   . Hypertension Other    Social History Social History  Substance Use Topics  . Smoking status: Former Smoker    Packs/day: 0.20    Years: 0.50    Types:  Cigarettes  . Smokeless tobacco: Never Used     Comment: trying to quit  . Alcohol use No   Allergies   Patient has no known allergies.  Review of Systems Review of Systems  Constitutional: Negative for chills and fever.  Gastrointestinal: Negative for abdominal pain.  Genitourinary: Positive for flank pain. Negative for decreased urine volume, difficulty urinating, dysuria and hematuria.  Musculoskeletal: Positive for arthralgias, back pain and myalgias.  Neurological: Negative for weakness and numbness.  All other systems reviewed and are negative.  Physical Exam Updated Vital Signs BP 120/83 (BP Location: Right Arm)    Pulse 74   Temp 97.8 F (36.6 C) (Oral)   Resp 18   LMP 09/11/2012   SpO2 99%   Physical Exam  Constitutional: She is oriented to person, place, and time. She appears well-developed and well-nourished. No distress.  HENT:  Head: Normocephalic and atraumatic.  Eyes: Conjunctivae are normal.  Neck: Normal range of motion.  Cardiovascular: Normal rate, regular rhythm and normal heart sounds.   Pulmonary/Chest: Effort normal and breath sounds normal. No respiratory distress. She has no wheezes. She has no rales.  Abdominal: Soft. She exhibits no distension. There is no tenderness.  Musculoskeletal: Normal range of motion.  Significant tenderness to palpation in the left mid back, left flank area. No rashes, swelling, erythema. Pain with any range of motion of the torso or legs.  Neurological: She is alert and oriented to person, place, and time.  5/5 and equal lower extremity strength. 2+ and equal patellar reflexes bilaterally. Pt able to dorsiflex bilateral toes and feet with good strength against resistance. Equal sensation bilaterally over thighs and lower legs.   Skin: No pallor.  Psychiatric: She has a normal mood and affect. Her behavior is normal.  Nursing note and vitals reviewed.  ED Treatments / Results  DIAGNOSTIC STUDIES: Oxygen Saturation is 99% on RA, normal by my interpretation.   COORDINATION OF CARE: 11:09 AM-Discussed next steps with pt. Pt verbalized understanding and is agreeable with the plan.   Labs (all labs ordered are listed, but only abnormal results are displayed) Labs Reviewed - No data to display  EKG  EKG Interpretation None      Radiology Ct Renal Stone Study  Result Date: 08/04/2016 CLINICAL DATA:  Left flank pain EXAM: CT ABDOMEN AND PELVIS WITHOUT CONTRAST TECHNIQUE: Multidetector CT imaging of the abdomen and pelvis was performed following the standard protocol without IV contrast. COMPARISON:  11/10/2012 FINDINGS: Lower chest: Lung bases  are clear. Hepatobiliary: Unenhanced liver is unremarkable. Gallbladder is unremarkable. No intrahepatic or extrahepatic ductal dilatation. Pancreas: Within normal limits. Spleen: Within normal limits. Adrenals/Urinary Tract: Adrenal glands are within normal limits. Kidneys are within normal limits. No renal, ureteral, or bladder calculi. No hydronephrosis. Bladder is within normal limits. Stomach/Bowel: Stomach is within normal limits. No evidence of bowel obstruction. Normal appendix (series 3/ image 64). Vascular/Lymphatic: No evidence of abdominal aortic aneurysm. No suspicious abdominopelvic lymphadenopathy. Reproductive: Status post hysterectomy. Bilateral ovaries are within normal limits. Other: No abdominopelvic ascites. Musculoskeletal: Visualized osseous structures are within normal limits. IMPRESSION: No renal, ureteral, or bladder calculi.  No hydronephrosis. Negative CT abdomen/ pelvis. No CT findings to account for the patient's left flank pain. Electronically Signed   By: Julian Hy M.D.   On: 08/04/2016 12:25    Procedures Procedures   Medications Ordered in ED Medications - No data to display  Initial Impression / Assessment and Plan / ED Course  I have reviewed  the triage vital signs and the nursing notes.  Pertinent labs & imaging results that were available during my care of the patient were reviewed by me and considered in my medical decision making (see chart for details).     Patient in the room crying and screaming in pain. She is here with left lower back pain/flank pain. The severe pain comes and goes, states that feels like spasms. We'll get urinalysis, CT renal to rule out kidney stone. Will treat with pain medication, Toradol, Valium for spasms. Will reassess.   CT negative for stone. Urinalysis unremarkable. Patient feeling better. She is able to ambulate to the bathroom. She does report still having pain but much improved. We'll discharge home with pain  medications, muscle relaxants, anti-inflammatories. Will have her follow-up with family doctor. Discussed stretching, massage, following up. Return precautions discussed  Vitals:   08/04/16 1044 08/04/16 1115 08/04/16 1145 08/04/16 1300  BP: 120/83 (!) 126/109 107/70 103/62  Pulse: 74 81 65 71  Resp: 18     Temp: 97.8 F (36.6 C)     TempSrc: Oral     SpO2: 99% 98% 98% 99%    Final Clinical Impressions(s) / ED Diagnoses   Final diagnoses:  Muscle spasm of back   New Prescriptions New Prescriptions   HYDROCODONE-ACETAMINOPHEN (NORCO) 5-325 MG TABLET    Take 1 tablet by mouth every 6 (six) hours as needed for moderate pain.   METHOCARBAMOL (ROBAXIN) 750 MG TABLET    Take 1 tablet (750 mg total) by mouth 2 (two) times daily.   NAPROXEN (NAPROSYN) 500 MG TABLET    Take 1 tablet (500 mg total) by mouth 2 (two) times daily.   I personally performed the services described in this documentation, which was scribed in my presence. The recorded information has been reviewed and is accurate.     Jeannett Senior, PA-C 08/04/16 1422    Daleen Bo, MD 08/04/16 [phone]

## 2016-08-04 NOTE — ED Triage Notes (Signed)
Patient presents today with complaints of back pain x days. Patient reports she was lifting cabinets at work and felt fine and pain became progressively worst throughout the night. Patient denies any changes in bowel or bladder. Patient denies numbness of tingling.

## 2016-08-04 NOTE — Discharge Instructions (Signed)
Take naproxen for pain. Norco for severe pain. Robaxin for muscle spasms. Heat pads, stretches, massage. Follow-up with family doctor as needed.

## 2018-11-20 ENCOUNTER — Emergency Department (HOSPITAL_COMMUNITY)
Admission: EM | Admit: 2018-11-20 | Discharge: 2018-11-20 | Disposition: A | Discharge status: Home / Self Care | Payer: BC Managed Care – PPO | Attending: Emergency Medicine | Admitting: Emergency Medicine

## 2018-11-20 ENCOUNTER — Other Ambulatory Visit: Payer: Self-pay

## 2018-11-20 ENCOUNTER — Encounter (HOSPITAL_COMMUNITY): Payer: Self-pay | Admitting: Emergency Medicine

## 2018-11-20 DIAGNOSIS — M62830 Muscle spasm of back: Secondary | ICD-10-CM | POA: Diagnosis not present

## 2018-11-20 DIAGNOSIS — Z87891 Personal history of nicotine dependence: Secondary | ICD-10-CM | POA: Insufficient documentation

## 2018-11-20 DIAGNOSIS — M549 Dorsalgia, unspecified: Secondary | ICD-10-CM | POA: Diagnosis present

## 2018-11-20 MED ORDER — IBUPROFEN 600 MG PO TABS: 600.0000 mg | ORAL_TABLET | Freq: Four times a day (QID) | ORAL | 0 refills | Status: DC | PRN

## 2018-11-20 MED ORDER — METHOCARBAMOL 500 MG PO TABS: 500.0000 mg | ORAL_TABLET | Freq: Two times a day (BID) | ORAL | 0 refills | Status: DC

## 2018-11-20 MED ORDER — HYDROCODONE-ACETAMINOPHEN 5-325 MG PO TABS
1.0000 | ORAL_TABLET | Freq: Once | ORAL | Status: AC
  Administered 2018-11-20: 18:00:00 1 via ORAL
  Filled 2018-11-20: qty 1

## 2018-11-20 MED ORDER — BUPIVACAINE HCL (PF) 0.5 % IJ SOLN
10.0000 mL | Freq: Once | INTRAMUSCULAR | Status: AC
  Administered 2018-11-20: 19:00:00 10 mL
  Filled 2018-11-20: qty 10

## 2018-11-20 MED ORDER — METHOCARBAMOL 500 MG PO TABS
500.0000 mg | ORAL_TABLET | Freq: Once | ORAL | Status: AC
Start: 2018-11-20 — End: 2018-11-20
  Administered 2018-11-20: 18:00:00 500 mg via ORAL
  Filled 2018-11-20: qty 1

## 2018-11-20 MED ORDER — LIDOCAINE-EPINEPHRINE (PF) 2 %-1:200000 IJ SOLN
10.0000 mL | Freq: Once | INTRAMUSCULAR | Status: AC
  Administered 2018-11-20: 10 mL
  Filled 2018-11-20: qty 20

## 2018-11-20 MED ORDER — IBUPROFEN 400 MG PO TABS
600.0000 mg | ORAL_TABLET | Freq: Once | ORAL | Status: AC
  Administered 2018-11-20: 18:00:00 600 mg via ORAL
  Filled 2018-11-20: qty 1

## 2018-11-20 NOTE — ED Provider Notes (Signed)
[facility] EMERGENCY DEPARTMENT Provider Note   CSN: [id number] Arrival date & time: 11/20/18  1635     History   Chief Complaint Chief Complaint  Patient presents with  . Back Pain    HPI Caroline Carlson is a 38 y.o. female.     Caroline Carlson is a 38 y.o. female with a history of depression, anxiety, obesity and hyperlipidemia, who presents to the ED for evaluation of right-sided upper back spasms.  Patient reports symptoms have been present over the past 3 days but significantly worsened last night.  Pain is worse just adjacent to the spinal column below the right shoulder blade.  She reports history of similar muscle spasms in the past for which she has come to the ED for and received trigger point injections with improvement.  She has been trying over-the-counter pain relievers and Flexeril at home without improvement.  She has also been rolling on a tennis ball without improvement.  She reports pain is made worse with movement, palpation and deep breath but denies anterior chest pain.  Denies any lower extremity swelling, no recent surgery or long distance travel, no hormonal birth control and no history of previous PE or DVT.  Has had previous cardiac work-ups for this pain which were reassuring.  No other aggravating or alleviating factors.     Past Medical History:  Diagnosis Date  . Anemia   . Anxiety   . Depression   . History of blood transfusion 06/2002   [facility] - unsure of number of units  . Hyperlipidemia    borderline - diet controlled  . Mental disorder    Currently depressed  . Panic attack   . PONV (postoperative nausea and vomiting)     Patient Active Problem List   Diagnosis Date Noted  . Obesity (BMI 30.0-34.9) 08/18/2013  . Follow-up examination, following unspecified surgery 11/16/2012  . Depression 08/14/2012    Past Surgical History:  Procedure Laterality Date  . BILATERAL SALPINGECTOMY N/A 09/25/2012   Procedure:  BILATERAL SALPINGECTOMY;  Surgeon: Lahoma Crocker, MD;  Location: [facility];  Service: Gynecology;  Laterality: N/A;  . CESAREAN SECTION  2002, 2004, 2006   x 3  . ROBOTIC ASSISTED TOTAL HYSTERECTOMY N/A 09/25/2012   Procedure: ROBOTIC ASSISTED TOTAL HYSTERECTOMY;  Surgeon: Lahoma Crocker, MD;  Location: [facility];  Service: Gynecology;  Laterality: N/A;  . TUBAL LIGATION       OB History    Gravida  3   Para  3   Term  3   Preterm      AB      Living  3     SAB      TAB      Ectopic      Multiple      Live Births               Home Medications    Prior to Admission medications   Medication Sig Start Date End Date Taking? Authorizing Provider  HYDROcodone-acetaminophen (NORCO) 5-325 MG tablet Take 1 tablet by mouth every 6 (six) hours as needed for moderate pain. 08/04/16   Kirichenko, Tatyana, PA-C  ibuprofen (ADVIL) 600 MG tablet Take 1 tablet (600 mg total) by mouth every 6 (six) hours as needed. 11/20/18   Jacqlyn Larsen, PA-C  methocarbamol (ROBAXIN) 500 MG tablet Take 1 tablet (500 mg total) by mouth 2 (two) times daily. 11/20/18   Jacqlyn Larsen, PA-C  naproxen (NAPROSYN) 500  MG tablet Take 1 tablet (500 mg total) by mouth 2 (two) times daily. 08/04/16   Kirichenko, Tatyana, PA-C  Phentermine-Topiramate (QSYMIA) 7.5-46 MG CP24 Take 7.5 capsules by mouth daily. 10/20/13   Lahoma Crocker, MD    Family History Family History  Problem Relation Age of Onset  . Heart disease Other   . Hypertension Other     Social History Social History   Tobacco Use  . Smoking status: Former Smoker    Packs/day: 0.20    Years: 0.50    Pack years: 0.10    Types: Cigarettes  . Smokeless tobacco: Never Used  . Tobacco comment: trying to quit  Substance Use Topics  . Alcohol use: No  . Drug use: No     Allergies   Patient has no known allergies.   Review of Systems Review of Systems  Constitutional: Negative for chills and fever.  HENT: Negative.    Respiratory: Negative for cough and shortness of breath.   Cardiovascular: Negative for chest pain.  Gastrointestinal: Negative for abdominal pain, constipation, diarrhea, nausea and vomiting.  Genitourinary: Negative for dysuria, flank pain, frequency and hematuria.  Musculoskeletal: Positive for back pain. Negative for arthralgias, gait problem, joint swelling, myalgias and neck pain.  Skin: Negative for color change, rash and wound.  Neurological: Negative for weakness and numbness.     Physical Exam Updated Vital Signs BP (!) 141/91 (BP Location: Right Arm)   Pulse 77   Temp 98.1 F (36.7 C) (Oral)   Resp 16   Ht 5\' 5"  (1.651 m)   Wt 90.7 kg   LMP 09/11/2012   SpO2 100%   BMI 33.28 kg/m   Physical Exam Vitals signs and nursing note reviewed.  Constitutional:      General: She is not in acute distress.    Appearance: Normal appearance. She is well-developed. She is obese. She is not ill-appearing or diaphoretic.     Comments: Patient appears uncomfortable but is in no acute distress.  HENT:     Head: Normocephalic and atraumatic.  Eyes:     General:        Right eye: No discharge.        Left eye: No discharge.  Neck:     Musculoskeletal: Neck supple.  Cardiovascular:     Rate and Rhythm: Normal rate and regular rhythm.     Heart sounds: Normal heart sounds.  Pulmonary:     Effort: Pulmonary effort is normal. No respiratory distress.     Breath sounds: Normal breath sounds. No wheezing or rales.     Comments: Respirations equal and unlabored, patient able to speak in full sentences, lungs clear to auscultation bilaterally Musculoskeletal:        General: Tenderness present.       Back:     Comments: Tenderness to palpation over the thoracic paraspinal muscles with palpable muscle spasm and knots, pain is repeatedly reproducible with palpation.  No overlying skin changes or rash.  No midline spinal tenderness.  Skin:    General: Skin is warm and dry.      Capillary Refill: Capillary refill takes less than 2 seconds.  Neurological:     Mental Status: She is alert.     Coordination: Coordination normal.     Comments: Speech is clear, able to follow commands Normal strength in upper and lower extremities bilaterally including dorsiflexion and plantar flexion, strong and equal grip strength Sensation normal to light and sharp touch Moves extremities without ataxia,  coordination intact  Psychiatric:        Mood and Affect: Mood normal.        Behavior: Behavior normal.      ED Treatments / Results  Labs (all labs ordered are listed, but only abnormal results are displayed) Labs Reviewed - No data to display  EKG None  Radiology No results found.  Procedures Procedures (including critical care time)  Trigger Point Injection for Myofascial pain  Performed by: Benedetto Goad PA-C Indication: muscle/myofascial pain Muscle body and tendon sheath of the right thoracic paraspinalmuscle(s) were injected with 0.5% bupivacaine and 2% lidocaine w/ epi under sterile technique for release of muscle spasm/pain. Patient tolerated well with immediate improvement of symptoms and no immediate complications following procedure.  Medications Ordered in ED Medications  lidocaine-EPINEPHrine (XYLOCAINE W/EPI) 2 %-1:200000 (PF) injection 10 mL (has no administration in time range)  bupivacaine (MARCAINE) 0.5 % injection 10 mL (has no administration in time range)  ibuprofen (ADVIL) tablet 600 mg (600 mg Oral Given 11/20/18 1812)  methocarbamol (ROBAXIN) tablet 500 mg (500 mg Oral Given 11/20/18 1812)  HYDROcodone-acetaminophen (NORCO/VICODIN) 5-325 MG per tablet 1 tablet (1 tablet Oral Given 11/20/18 1812)     Initial Impression / Assessment and Plan / ED Course  I have reviewed the triage vital signs and the nursing notes.  Pertinent labs & imaging results that were available during my care of the patient were reviewed by me and considered in my  medical decision making (see chart for details).  38 year old female presents with muscle spasm over the right thoracic back.  History of the same and has been seen in the ED for this twice prior.  She reports pain is made worse with palpation movement and deep breath.  Consider diagnosis of PE in differential but I feel this is far less likely as pain is repeatedly reproducible with palpation, there is palpable muscle spasm on exam and she is overall low risk she is not on any hormonal birth control, has no previous history, and no recent travel or surgery.  Treated symptomatically for muscle spasm here in the ED with anti-inflammatories and muscle relaxers as well as trigger point injection with significant improvement in her symptoms.  Patient has had previous work-up for cardiac etiology of this pain that have been negative, and pain is previously been resolved by trigger point injections.  At this time feel patient is stable for discharge home with continued NSAID and muscle relaxer use.  PCP follow-up encouraged and return precautions discussed.  Final Clinical Impressions(s) / ED Diagnoses   Final diagnoses:  Muscle spasm of back    ED Discharge Orders         Ordered    ibuprofen (ADVIL) 600 MG tablet  Every 6 hours PRN     11/20/18 1913    methocarbamol (ROBAXIN) 500 MG tablet  2 times daily     11/20/18 1913           Jacqlyn Larsen, [place] 11/20/18 [place], MD 11/20/18 2347

## 2018-11-20 NOTE — ED Triage Notes (Signed)
Pt endorses upper back spasms since last night. Pt has taken muscle relaxer and tylenol with no relief.

## 2018-11-20 NOTE — Discharge Instructions (Signed)
Trigger point injection should help loosen muscle spasm.  Continue using ibuprofen every 6 hours, and Robaxin as needed to help with muscle spasm.  This medication can cause drowsiness, do not take before driving.  Continue using heat over the area and massage.  Follow-up with your PCP if symptoms or not improving.

## 2018-11-21 ENCOUNTER — Emergency Department (HOSPITAL_COMMUNITY): Payer: Medicaid Other

## 2018-11-21 ENCOUNTER — Emergency Department (HOSPITAL_COMMUNITY)
Admission: EM | Admit: 2018-11-21 | Discharge: 2018-11-21 | Disposition: A | Discharge status: Home / Self Care | Payer: Medicaid Other | Attending: Emergency Medicine | Admitting: Emergency Medicine

## 2018-11-21 DIAGNOSIS — Z79899 Other long term (current) drug therapy: Secondary | ICD-10-CM | POA: Diagnosis not present

## 2018-11-21 DIAGNOSIS — Z791 Long term (current) use of non-steroidal anti-inflammatories (NSAID): Secondary | ICD-10-CM | POA: Diagnosis not present

## 2018-11-21 DIAGNOSIS — Z87891 Personal history of nicotine dependence: Secondary | ICD-10-CM | POA: Diagnosis not present

## 2018-11-21 DIAGNOSIS — M25511 Pain in right shoulder: Secondary | ICD-10-CM | POA: Diagnosis not present

## 2018-11-21 DIAGNOSIS — M546 Pain in thoracic spine: Secondary | ICD-10-CM | POA: Diagnosis present

## 2018-11-21 DIAGNOSIS — M898X1 Other specified disorders of bone, shoulder: Secondary | ICD-10-CM

## 2018-11-21 LAB — CBC WITH DIFFERENTIAL/PLATELET
Abs Immature Granulocytes: 0.02 10*3/uL (ref 0.00–0.07)
Basophils Absolute: 0.1 10*3/uL (ref 0.0–0.1)
Basophils Relative: 1 %
Eosinophils Absolute: 0.1 10*3/uL (ref 0.0–0.5)
Eosinophils Relative: 1 %
HCT: 41.2 % (ref 36.0–46.0)
Hemoglobin: 13.5 g/dL (ref 12.0–15.0)
Immature Granulocytes: 0 %
Lymphocytes Relative: 40 %
Lymphs Abs: 4.2 10*3/uL — ABNORMAL HIGH (ref 0.7–4.0)
MCH: 30.2 pg (ref 26.0–34.0)
MCHC: 32.8 g/dL (ref 30.0–36.0)
MCV: 92.2 fL (ref 80.0–100.0)
Monocytes Absolute: 0.6 10*3/uL (ref 0.1–1.0)
Monocytes Relative: 6 %
Neutro Abs: 5.6 10*3/uL (ref 1.7–7.7)
Neutrophils Relative %: 52 %
Platelets: 356 10*3/uL (ref 150–400)
RBC: 4.47 MIL/uL (ref 3.87–5.11)
RDW: 13.5 % (ref 11.5–15.5)
WBC: 10.6 10*3/uL — ABNORMAL HIGH (ref 4.0–10.5)
nRBC: 0 % (ref 0.0–0.2)

## 2018-11-21 LAB — BASIC METABOLIC PANEL
Anion gap: 8 (ref 5–15)
BUN: 14 mg/dL (ref 6–20)
CO2: 22 mmol/L (ref 22–32)
Calcium: 9.1 mg/dL (ref 8.9–10.3)
Chloride: 109 mmol/L (ref 98–111)
Creatinine, Ser: 0.81 mg/dL (ref 0.44–1.00)
GFR calc Af Amer: >60 mL/min (ref >60)
GFR calc non Af Amer: >60 mL/min (ref >60)
Glucose, Bld: 100 mg/dL — ABNORMAL HIGH (ref 70–99)
Potassium: 3.9 mmol/L (ref 3.5–5.1)
Sodium: 139 mmol/L (ref 135–145)

## 2018-11-21 LAB — D-DIMER, QUANTITATIVE: D-Dimer, Quant: 0.45 ug/mL-FEU (ref 0.00–0.50)

## 2018-11-21 IMAGING — CR DG CHEST 2V
2 series · 2 of 2 positions shown · non-contrast
Comparison: [DATE]

CLINICAL DATA: 38-year-old female with a history of back spasms

EXAM:
CHEST - 2 VIEW

[chest pa]
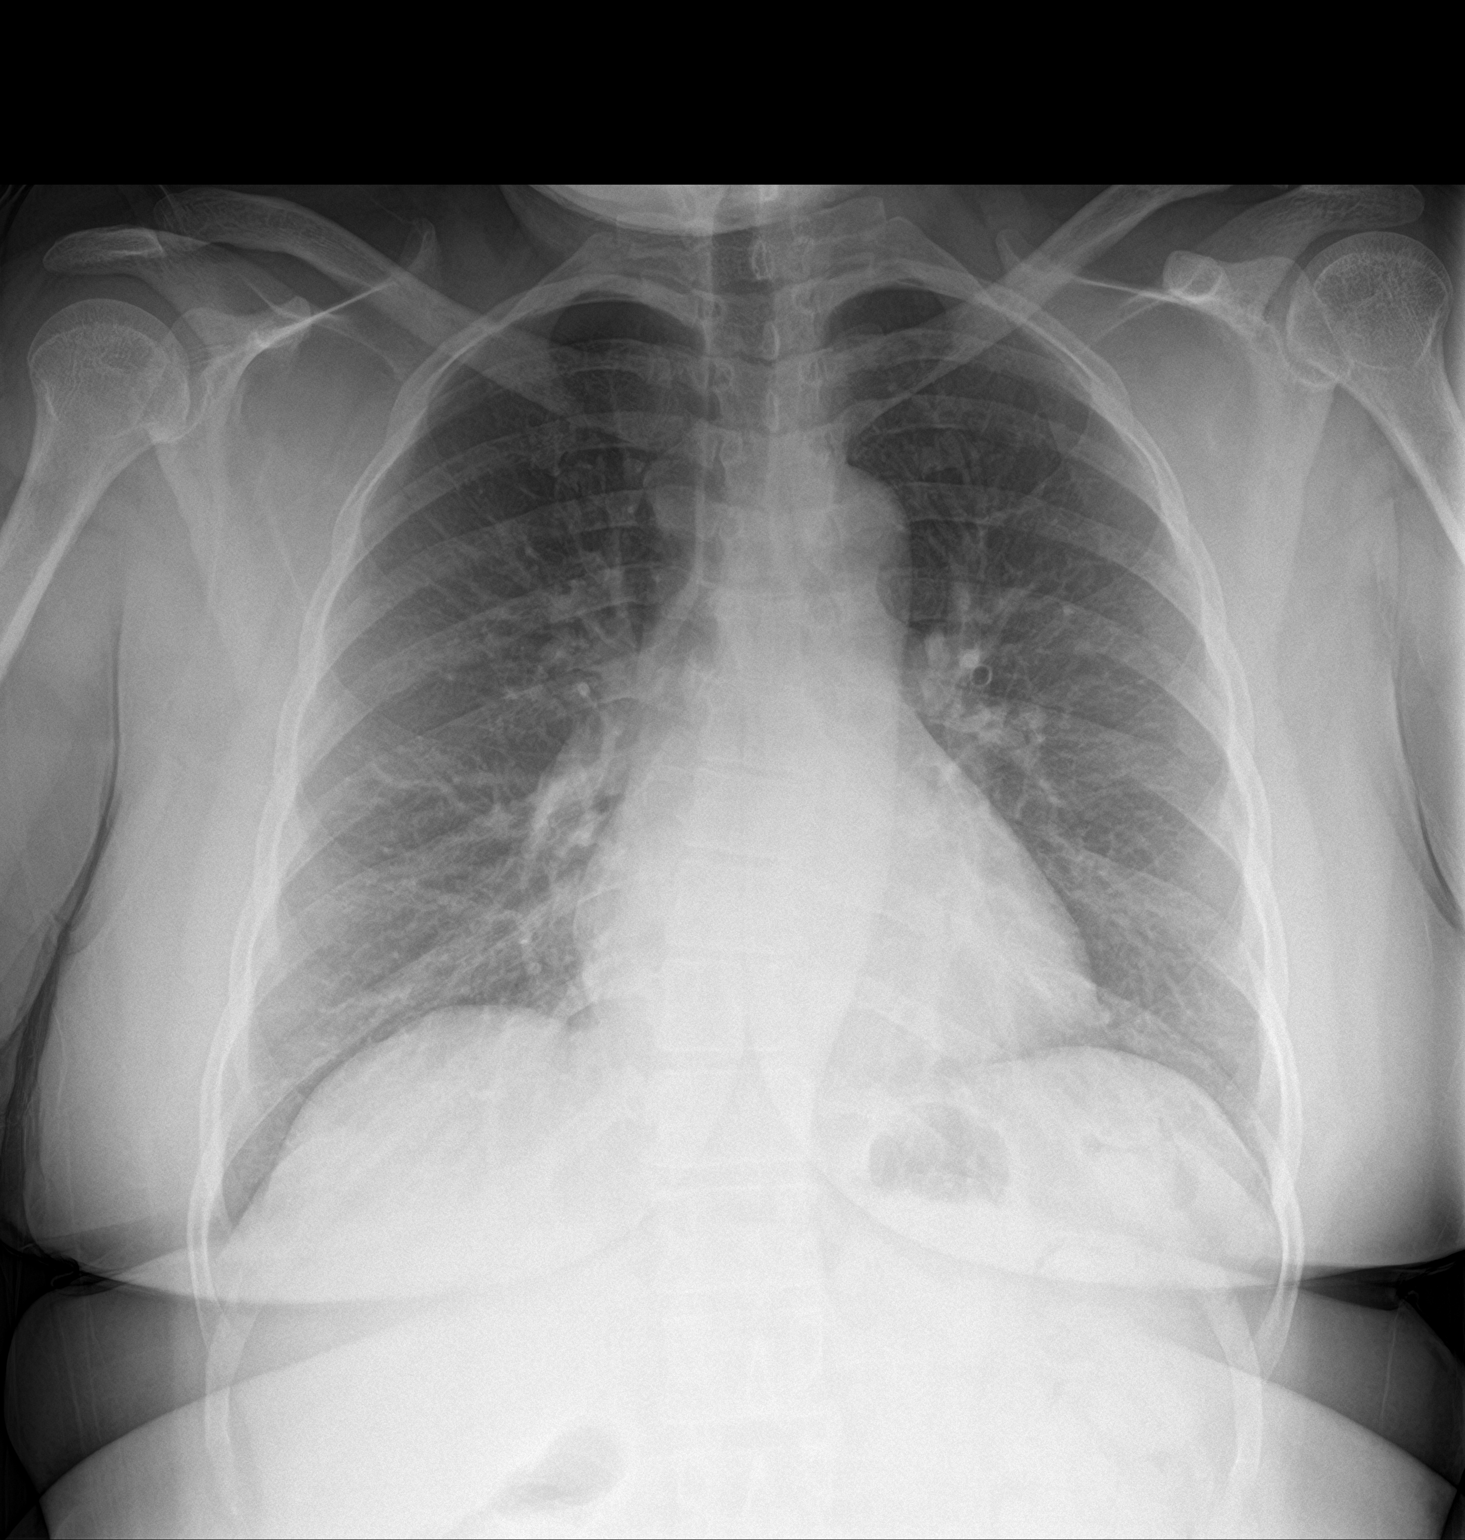

[chest lat]
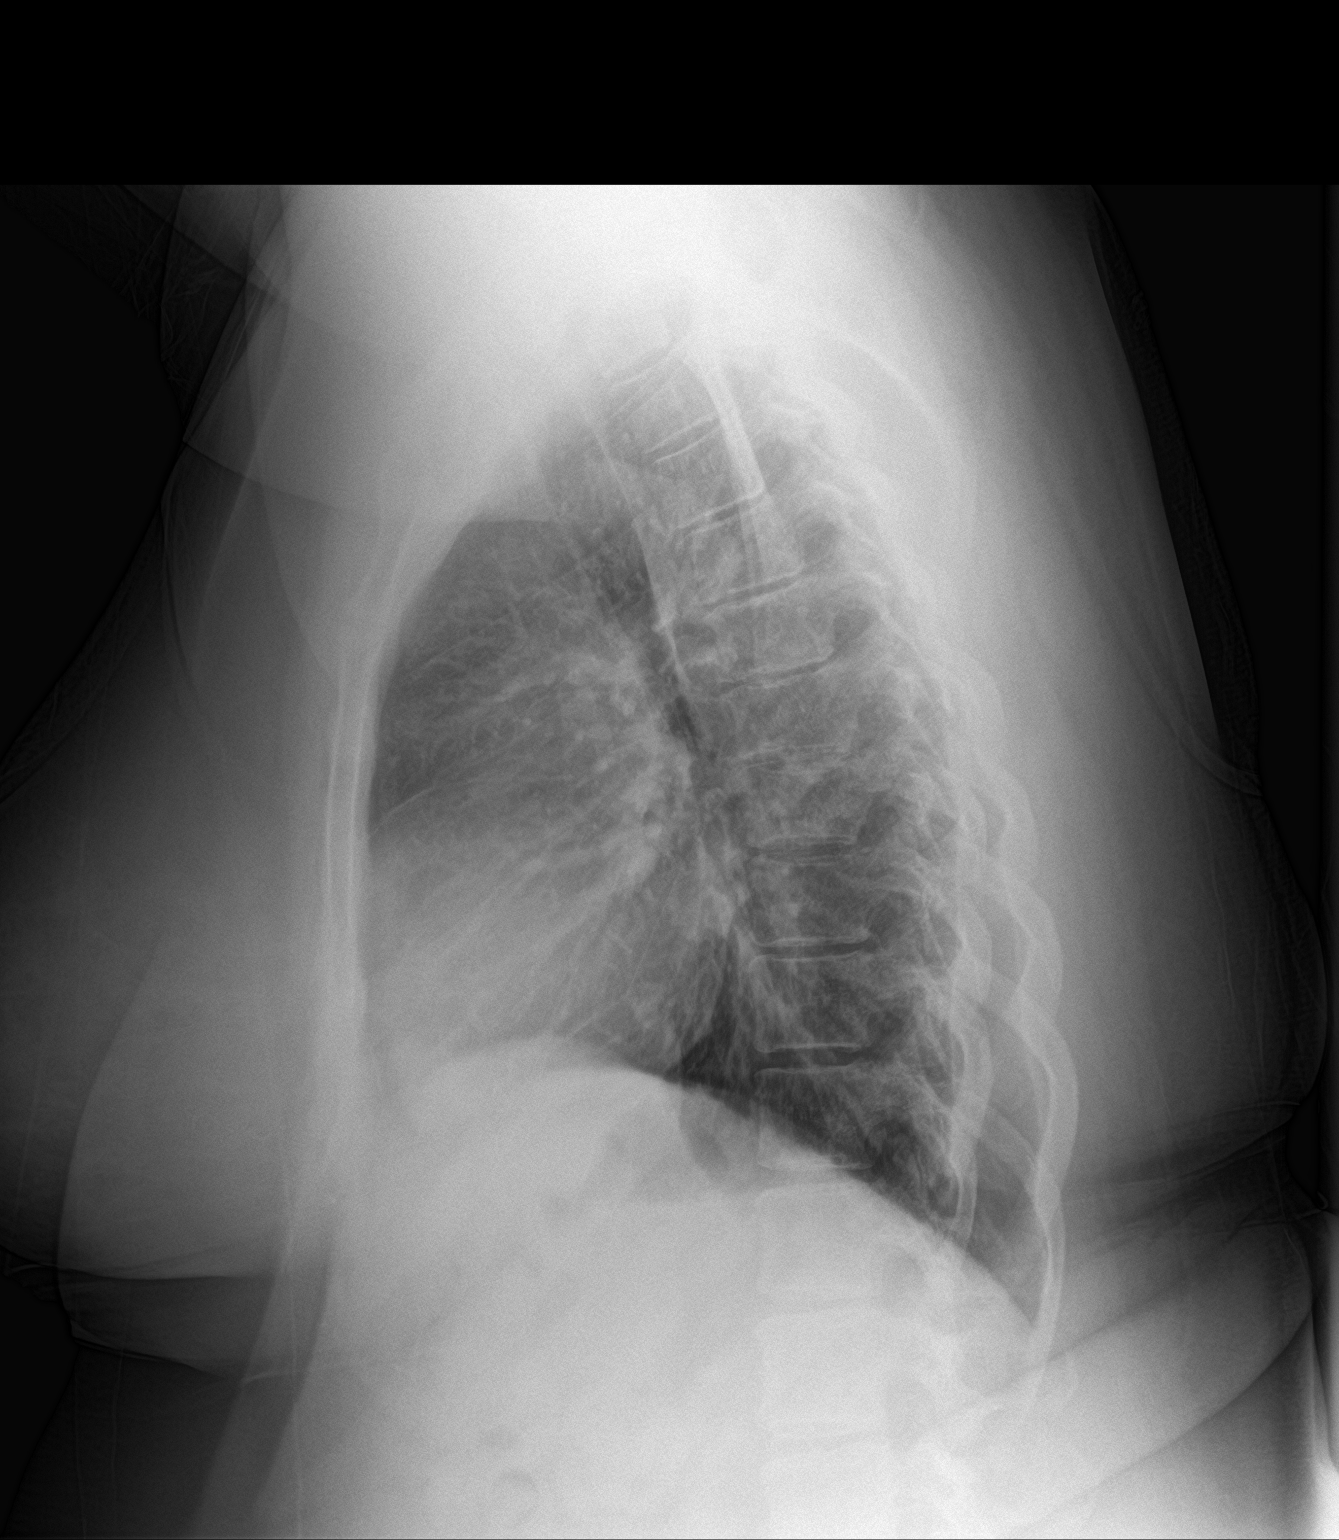

[2 of 2 positions shown; findings below may reference images not displayed]

FINDINGS: Cardiomediastinal silhouette unchanged in size and contour. No
evidence of central vascular congestion. No pneumothorax or pleural
effusion. No confluent airspace disease.

No displaced fracture with mild degenerative changes of the thoracic
spine.
IMPRESSION: Negative for acute cardiopulmonary disease

## 2018-11-21 MED ORDER — DIAZEPAM 2 MG PO TABS
2.0000 mg | ORAL_TABLET | Freq: Once | ORAL | Status: AC
  Administered 2018-11-21: 12:00:00 2 mg via ORAL
  Filled 2018-11-21: qty 1

## 2018-11-21 MED ORDER — HYDROMORPHONE HCL 1 MG/ML IJ SOLN
1.0000 mg | Freq: Once | INTRAMUSCULAR | Status: AC
  Administered 2018-11-21: 1 mg via INTRAVENOUS
  Filled 2018-11-21: qty 1

## 2018-11-21 MED ORDER — MORPHINE SULFATE (PF) 4 MG/ML IV SOLN
4.0000 mg | Freq: Once | INTRAVENOUS | Status: AC
  Administered 2018-11-21: 4 mg via INTRAVENOUS
  Filled 2018-11-21: qty 1

## 2018-11-21 MED ORDER — HYDROCODONE-ACETAMINOPHEN 5-325 MG PO TABS: 2.0000 | ORAL_TABLET | ORAL | 0 refills | Status: DC | PRN

## 2018-11-21 MED ORDER — KETOROLAC TROMETHAMINE 30 MG/ML IJ SOLN
30.0000 mg | Freq: Once | INTRAMUSCULAR | Status: AC
  Administered 2018-11-21: 30 mg via INTRAVENOUS
  Filled 2018-11-21: qty 1

## 2018-11-21 NOTE — ED Triage Notes (Signed)
Pt presents to the ED with back spasms that started two days ago. Reports she was seen here and given robaxin and ibuprofen. Pt reports she has had no relief with those medications at home. Reports 9/10 pain on the upper right side of her back.

## 2018-11-21 NOTE — Discharge Instructions (Signed)
The pain medicine as prescribed.  Do not drive, operate heavy machinery or make life or death decisions while taking this medicine.  This medication may become addictive.  Follow-up with your PCP in 2 days for reevaluation.  Return to the ED for any new worsening symptoms.

## 2018-11-21 NOTE — ED Notes (Signed)
Patient transported to X-ray 

## 2018-11-21 NOTE — ED Provider Notes (Signed)
[facility] EMERGENCY DEPARTMENT Provider Note   CSN: [id number] Arrival date & time: 11/21/18  [id number]   History   Chief Complaint Back pain  HPI Caroline Carlson is a 38 y.o. female with medical history significant for anxiety, anemia who presents for evaluation of back pain.  Patient seen yesterday in ED for thoracic back pain.  Pain located to right thoracic spine.  Had pain over the last 4 days however worsened over the last 24 hours.  She was given Robaxin and had trigger point injection today in the ED.  She has tried Flexeril at home without improvement.  She is also been rolling on a tennis ball.  It is worse with deep breathing, movement.  She has no prior history of PE or DVT.  States her pain is significantly worse with deep breathing.  She denies any chest pain however states she gets a sharp pain under her right scapula whenever she takes a deep breath.  There is also pain when she moves.  No recent history of surgery, OCPs, anticoagulation.  Rated a 10/10.  Patient tearful on exam.  No additional aggravating or alleviating factors.  Denies fever, chills, hemoptysis, neck pain, neck stiffness, arm pain, flank pain, dysuria, hematuria, abdominal pain, diarrhea.  Patient has also had 3 episodes of emesis which patient relates to pain.  Emesis nonbloody, nonbilious.  History obtained from patient, husband in room as well as past medical records.  No interpreter is used.     HPI  Past Medical History:  Diagnosis Date   Anemia    Anxiety    Depression    History of blood transfusion 06/2002   [facility] - unsure of number of units   Hyperlipidemia    borderline - diet controlled   Mental disorder    Currently depressed   Panic attack    PONV (postoperative nausea and vomiting)     Patient Active Problem List   Diagnosis Date Noted   Obesity (BMI 30.0-34.9) 08/18/2013   Follow-up examination, following unspecified surgery 11/16/2012   Depression  08/14/2012    Past Surgical History:  Procedure Laterality Date   BILATERAL SALPINGECTOMY N/A 09/25/2012   Procedure: BILATERAL SALPINGECTOMY;  Surgeon: Lahoma Crocker, MD;  Location: [facility];  Service: Gynecology;  Laterality: N/A;   CESAREAN SECTION  2002, 2004, 2006   x 3   ROBOTIC ASSISTED TOTAL HYSTERECTOMY N/A 09/25/2012   Procedure: ROBOTIC ASSISTED TOTAL HYSTERECTOMY;  Surgeon: Lahoma Crocker, MD;  Location: [facility];  Service: Gynecology;  Laterality: N/A;   TUBAL LIGATION       OB History    Gravida  3   Para  3   Term  3   Preterm      AB      Living  3     SAB      TAB      Ectopic      Multiple      Live Births               Home Medications    Prior to Admission medications   Medication Sig Start Date End Date Taking? Authorizing Provider  HYDROcodone-acetaminophen (NORCO/VICODIN) 5-325 MG tablet Take 2 tablets by mouth every 4 (four) hours as needed. 11/21/18   Darin Arndt A, PA-C  ibuprofen (ADVIL) 600 MG tablet Take 1 tablet (600 mg total) by mouth every 6 (six) hours as needed. 11/20/18   Jacqlyn Larsen, PA-C  methocarbamol (ROBAXIN) 500  MG tablet Take 1 tablet (500 mg total) by mouth 2 (two) times daily. 11/20/18   Jacqlyn Larsen, PA-C  naproxen (NAPROSYN) 500 MG tablet Take 1 tablet (500 mg total) by mouth 2 (two) times daily. 08/04/16   Kirichenko, Tatyana, PA-C  Phentermine-Topiramate (QSYMIA) 7.5-46 MG CP24 Take 7.5 capsules by mouth daily. 10/20/13   Lahoma Crocker, MD    Family History Family History  Problem Relation Age of Onset   Heart disease Other    Hypertension Other     Social History Social History   Tobacco Use   Smoking status: Former Smoker    Packs/day: 0.20    Years: 0.50    Pack years: 0.10    Types: Cigarettes   Smokeless tobacco: Never Used   Tobacco comment: trying to quit  Substance Use Topics   Alcohol use: No   Drug use: No    Allergies   Patient has no known  allergies.  Review of Systems Review of Systems  Constitutional: Negative.   HENT: Negative.   Respiratory: Negative.   Cardiovascular: Negative.   Gastrointestinal: Negative.   Musculoskeletal: Negative for gait problem.       Right scapular pain  Neurological: Negative.   All other systems reviewed and are negative.  Physical Exam Updated Vital Signs BP 132/78    Pulse 79    Temp 98.1 F (36.7 C) (Oral)    Resp 16    Ht 5\' 5"  (1.651 m)    Wt 90.7 kg    LMP 09/11/2012    SpO2 96%    BMI 33.28 kg/m   Physical Exam Vitals signs and nursing note reviewed.  Constitutional:      General: She is not in acute distress.    Appearance: She is well-developed. She is not ill-appearing, toxic-appearing or diaphoretic.     Comments: Patient not in any acute distress however does appear, uncomfortable  HENT:     Head: Normocephalic and atraumatic.     Nose: Nose normal.     Mouth/Throat:     Mouth: Mucous membranes are moist.     Pharynx: Oropharynx is clear.  Eyes:     Pupils: Pupils are equal, round, and reactive to light.  Neck:     Musculoskeletal: Normal range of motion.  Cardiovascular:     Rate and Rhythm: Normal rate.     Pulses: Normal pulses.          Radial pulses are 2+ on the right side and 2+ on the left side.     Heart sounds: Normal heart sounds.     Comments: Patient winces with deep breaths. Pulmonary:     Effort: Pulmonary effort is normal. No respiratory distress.     Breath sounds: Normal breath sounds.  Abdominal:     General: Bowel sounds are normal. There is no distension.     Tenderness: There is no abdominal tenderness. There is no right CVA tenderness, left CVA tenderness, guarding or rebound.  Musculoskeletal: Normal range of motion.       Back:     Comments: Tenderness palpation to inferior portion of the right scapula.  No midline spinal tenderness.  No tenderness over bilateral trapezius.  She has full range of motion bilateral shoulders however  has pain with overhead movement to her right shoulder which causes pain to her right scapula.  No overlying skin changes.  Skin:    General: Skin is warm and dry.     Capillary Refill: Capillary  refill takes less than 2 seconds.     Comments: No edema, erythema, ecchymosis or warmth.  No vesicles or lesions.  No fluctuance or induration.  Neurological:     Mental Status: She is alert.     Comments: Cranial nerves II through XII grossly intact.     ED Treatments / Results  Labs (all labs ordered are listed, but only abnormal results are displayed) Labs Reviewed  CBC WITH DIFFERENTIAL/PLATELET - Abnormal; Notable for the following components:      Result Value   WBC 10.6 (*)    Lymphs Abs 4.2 (*)    All other components within normal limits  BASIC METABOLIC PANEL - Abnormal; Notable for the following components:   Glucose, Bld 100 (*)    All other components within normal limits  D-DIMER, QUANTITATIVE (NOT AT [facility])    EKG EKG Interpretation  Date/Time:  Saturday November 21 2018 10:57:44 EST Ventricular Rate:  78 PR Interval:    QRS Duration: 96 QT Interval:  378 QTC Calculation: 431 R Axis:   74 Text Interpretation: Sinus rhythm Ventricular premature complex Low voltage, precordial leads Borderline T abnormalities, anterior leads When compared to prior, no significant cahnges seen. No STEMI Confirmed by Antony Blackbird [phone]) on 11/21/2018 11:46:55 AM   Radiology Dg Chest 2 View  Result Date: 11/21/2018 CLINICAL DATA:  38 year old female with a history of back spasms EXAM: CHEST - 2 VIEW COMPARISON:  02/24/2016 FINDINGS: Cardiomediastinal silhouette unchanged in size and contour. No evidence of central vascular congestion. No pneumothorax or pleural effusion. No confluent airspace disease. No displaced fracture with mild degenerative changes of the thoracic spine. IMPRESSION: Negative for acute cardiopulmonary disease Electronically Signed   By: Corrie Mckusick D.O.   On:  11/21/2018 10:46    Procedures Procedures (including critical care time)  Medications Ordered in ED Medications  ketorolac (TORADOL) 30 MG/ML injection 30 mg (30 mg Intravenous Given 11/21/18 1007)  morphine 4 MG/ML injection 4 mg (4 mg Intravenous Given 11/21/18 1008)  diazepam (VALIUM) tablet 2 mg (2 mg Oral Given 11/21/18 1130)  HYDROmorphone (DILAUDID) injection 1 mg (1 mg Intravenous Given 11/21/18 1130)    Initial Impression / Assessment and Plan / ED Course  I have reviewed the triage vital signs and the nursing notes.  Pertinent labs & imaging results that were available during my care of the patient were reviewed by me and considered in my medical decision making (see chart for details).  38 year old female appears otherwise well presents for evaluation of right scapular pain.  Seen yesterday for same.  Was given naproxen, Robaxin and had reported injection at that time.  Patient states pain has worsened today.  Pain worse with deep breathing.  No prior history of PE or DVT.  She has no tachycardia, tachypnea or hypoxia.  She does have significant tenderness to the right inferior scapula.  She does have mild spasm.  Patient winces in pain with deep breathing.  No flank pain or urinary symptoms to suggest stone.  She has no midline spinal tenderness.  No red flags for back pain.  Patient does appear uncomfortable on exam.  Patient whether this is musculoskeletal in nature versus PE.  Although PE is low on differential in her home medications are not working will obtain D-dimer as well as some basic labs.  Has had 3 episodes of emesis which is been nonbloody secondary to pain.  He has no abdominal pain to suggest gallbladder is pathology of her scapular pain.  Labs and imaging personally reviewed: CBC with mild leukocytosis D-Dimer negative BMP with mild elevated of glucose at 100 DG chest without acute findings  Labs and imaging personally reviewed without acute findings.  She is  ambulatory without difficulty.  Patient states her pain has resolved.  Will DC home with short course of pain medicine.  She has lidocaine patches at home.  Discussed continue taking her home muscle relaxer as well as her anti-inflammatories.  Patient follow-up with PCP on Monday for any new worsening symptoms. High suspicion for MSK pain.  Discussed strict return precautions.  Patient voiced understanding and is agreeable follow-up.  The patient has been appropriately medically screened and/or stabilized in the ED. I have low suspicion for any other emergent medical condition which would require further screening, evaluation or treatment in the ED or require inpatient management.  Patient is hemodynamically stable and in no acute distress.  Patient able to ambulate in department prior to ED.  Evaluation does not show acute pathology that would require ongoing or additional emergent interventions while in the emergency department or further inpatient treatment.  I have discussed the diagnosis with the patient and answered all questions.  Pain is been managed while in the emergency department and patient has no further complaints prior to discharge.  Patient is comfortable with plan discussed in room and is stable for discharge at this time.  I have discussed strict return precautions for returning to the emergency department.  Patient was encouraged to follow-up with PCP/specialist refer to at discharge.      Final Clinical Impressions(s) / ED Diagnoses   Final diagnoses:  Pain of right scapula    ED Discharge Orders         Ordered    HYDROcodone-acetaminophen (NORCO/VICODIN) 5-325 MG tablet  Every 4 hours PRN     11/21/18 1315           Deandre Brannan A, PA-C 11/21/18 1316    Tegeler, Gwenyth Allegra, MD 11/21/18 1614

## 2018-11-21 NOTE — ED Notes (Signed)
Patient back from  X-ray 

## 2018-11-23 ENCOUNTER — Other Ambulatory Visit: Payer: Self-pay

## 2018-11-23 ENCOUNTER — Ambulatory Visit (INDEPENDENT_AMBULATORY_CARE_PROVIDER_SITE_OTHER): Payer: BC Managed Care – PPO

## 2018-11-23 ENCOUNTER — Emergency Department (HOSPITAL_COMMUNITY)
Admission: EM | Admit: 2018-11-23 | Discharge: 2018-11-23 | Discharge status: Absent without leave | Payer: BC Managed Care – PPO

## 2018-11-23 ENCOUNTER — Ambulatory Visit (INDEPENDENT_AMBULATORY_CARE_PROVIDER_SITE_OTHER): Payer: BC Managed Care – PPO | Admitting: Orthopedic Surgery

## 2018-11-23 DIAGNOSIS — M546 Pain in thoracic spine: Secondary | ICD-10-CM

## 2018-11-27 ENCOUNTER — Encounter: Payer: Self-pay | Admitting: Orthopedic Surgery

## 2018-11-27 NOTE — Progress Notes (Signed)
Patient had a leave today without being seen.  Radiographs are reviewed and unremarkable for acute injury

## 2019-02-04 ENCOUNTER — Ambulatory Visit (INDEPENDENT_AMBULATORY_CARE_PROVIDER_SITE_OTHER): Payer: Medicaid Other

## 2019-02-04 ENCOUNTER — Ambulatory Visit: Payer: Medicaid Other | Admitting: Orthopedic Surgery

## 2019-02-04 ENCOUNTER — Encounter: Payer: Self-pay | Admitting: Orthopedic Surgery

## 2019-02-04 ENCOUNTER — Other Ambulatory Visit: Payer: Self-pay

## 2019-02-04 DIAGNOSIS — M546 Pain in thoracic spine: Secondary | ICD-10-CM

## 2019-02-04 DIAGNOSIS — M542 Cervicalgia: Secondary | ICD-10-CM | POA: Diagnosis not present

## 2019-02-05 ENCOUNTER — Encounter: Payer: Self-pay | Admitting: Orthopedic Surgery

## 2019-02-05 NOTE — Progress Notes (Signed)
Office Visit Note   Patient: Caroline Carlson           Date of Birth: 1980-10-29           MRN: [id number] Visit Date: 02/04/2019 Requested by: No referring provider defined for this encounter. PCP: Patient, No Pcp Per  Subjective: Chief Complaint  Patient presents with  . Neck - Pain  . Spine - Pain    HPI: Patient presents with neck and back pain.  Denies any discrete injury but she does report increased pain since October 2020.  Pain starts out with neck pain radiates into both shoulders.  Most of the pain is in the left shoulder.  She also describes mild low back pain.  The pain is most localized in the cervical thoracic region.  She states she is always had low back pain but the upper cervical spine pain and thoracic pain has been going on for 2 years worse over the last 6 weeks.  He denies to me that she cannot sleep on the right-hand side but now she is having left-sided symptoms as well.  She is having a lot of trapezial spasm on the left.  She is doing home exercise program stretching and heat for least 2 months as well as using topicals which have not been helping.  Aleve does help her symptoms some.  Denies any weakness in the hands.              ROS: All systems reviewed are negative as they relate to the chief complaint within the history of present illness.  Patient denies  fevers or chills.   Assessment & Plan: Visit Diagnoses:  1. Neck pain   2. Pain in thoracic spine     Plan: Impression is neck pain and upper thoracic pain.  Plan is she has had symptoms for over 2 years with failure of conservative management.  She is having pain which is waking her from sleep at night.  Plan is MRI of the cervical spine and thoracic spine to evaluate for possible radiculopathy.  She does have pretty focal kyphosis which is unusual on plain radiographs.  Degenerative disc disease also noted at C5-6.  Follow-Up Instructions: Return for after MRI.   Orders:  Orders Placed This  Encounter  Procedures  . XR Cervical Spine 2 or 3 views  . MR Cervical Spine w/o contrast  . MR Thoracic Spine w/o contrast   No orders of the defined types were placed in this encounter.     Procedures: No procedures performed   Clinical Data: No additional findings.  Objective: Vital Signs: LMP 09/11/2012   Physical Exam:   Constitutional: Patient appears well-developed HEENT:  Head: Normocephalic Eyes:EOM are normal Neck: Normal range of motion Cardiovascular: Normal rate Pulmonary/chest: Effort normal Neurologic: Patient is alert Skin: Skin is warm Psychiatric: Patient has normal mood and affect    Ortho Exam: Ortho exam demonstrates normal gait alignment with symmetric reflexes bilateral biceps and triceps at 1+ out of 4.  Radial pulse intact bilaterally.  Cervical spine range of motion is excellent to flexion extension and rotation.  She does have some muscle spasm in the trapezial region bilaterally.  No definite paresthesias C5-T1.  No muscle atrophy in either arm.  No scapular dyskinesia with forward flexion.  No masses lymphadenopathy or skin changes noted in the neck or back region.  Negative Hoffmann's bilaterally.  Specialty Comments:  No specialty comments available.  Imaging: No results found.   [place]  History: Patient Active Problem List   Diagnosis Date Noted  . Obesity (BMI 30.0-34.9) 08/18/2013  . Follow-up examination, following unspecified surgery 11/16/2012  . Depression 08/14/2012   Past Medical History:  Diagnosis Date  . Anemia   . Anxiety   . Depression   . History of blood transfusion 06/2002   [facility] - unsure of number of units  . Hyperlipidemia    borderline - diet controlled  . Mental disorder    Currently depressed  . Panic attack   . PONV (postoperative nausea and vomiting)     Family History  Problem Relation Age of Onset  . Heart disease Other   . Hypertension Other     Past Surgical History:  Procedure Laterality Date    . BILATERAL SALPINGECTOMY N/A 09/25/2012   Procedure: BILATERAL SALPINGECTOMY;  Surgeon: Lahoma Crocker, MD;  Location: [facility];  Service: Gynecology;  Laterality: N/A;  . CESAREAN SECTION  2002, 2004, 2006   x 3  . ROBOTIC ASSISTED TOTAL HYSTERECTOMY N/A 09/25/2012   Procedure: ROBOTIC ASSISTED TOTAL HYSTERECTOMY;  Surgeon: Lahoma Crocker, MD;  Location: [facility];  Service: Gynecology;  Laterality: N/A;  . TUBAL LIGATION     Social History   Occupational History  . Not on file  Tobacco Use  . Smoking status: Former Smoker    Packs/day: 0.20    Years: 0.50    Pack years: 0.10    Types: Cigarettes  . Smokeless tobacco: Never Used  . Tobacco comment: trying to quit  Substance and Sexual Activity  . Alcohol use: No  . Drug use: No  . Sexual activity: Yes    Birth control/protection: Surgical

## 2019-02-26 ENCOUNTER — Ambulatory Visit
Admission: RE | Admit: 2019-02-26 | Discharge: 2019-02-26 | Disposition: A | Discharge status: Home / Self Care | Payer: Medicaid Other | Source: Ambulatory Visit | Attending: Orthopedic Surgery | Admitting: Orthopedic Surgery

## 2019-02-26 ENCOUNTER — Other Ambulatory Visit: Payer: Self-pay

## 2019-02-26 DIAGNOSIS — M542 Cervicalgia: Secondary | ICD-10-CM

## 2019-02-26 DIAGNOSIS — M546 Pain in thoracic spine: Secondary | ICD-10-CM

## 2019-02-26 IMAGING — MR MR THORACIC SPINE W/O CM
4 of 6 series · 12 of 48 positions shown · non-contrast
Comparison: Concurrently performed MRI of the cervical spine,
thoracic spine radiographs [DATE]

CLINICAL DATA: Pain in thoracic spine. Additional history provided
by technologist: Patient reports neck pain radiating into bilateral
shoulders and upper thoracic spine for a few months.

EXAM:
MRI THORACIC SPINE WITHOUT CONTRAST
TECHNIQUE: Multiplanar, multisequence MR imaging of the thoracic spine was
performed. No intravenous contrast was administered.

[Series 5: T2 · sagittal · 4.0mm · 0.33mm/px · 3 of 13 slices shown (1 of 3)]
[im 3/13]
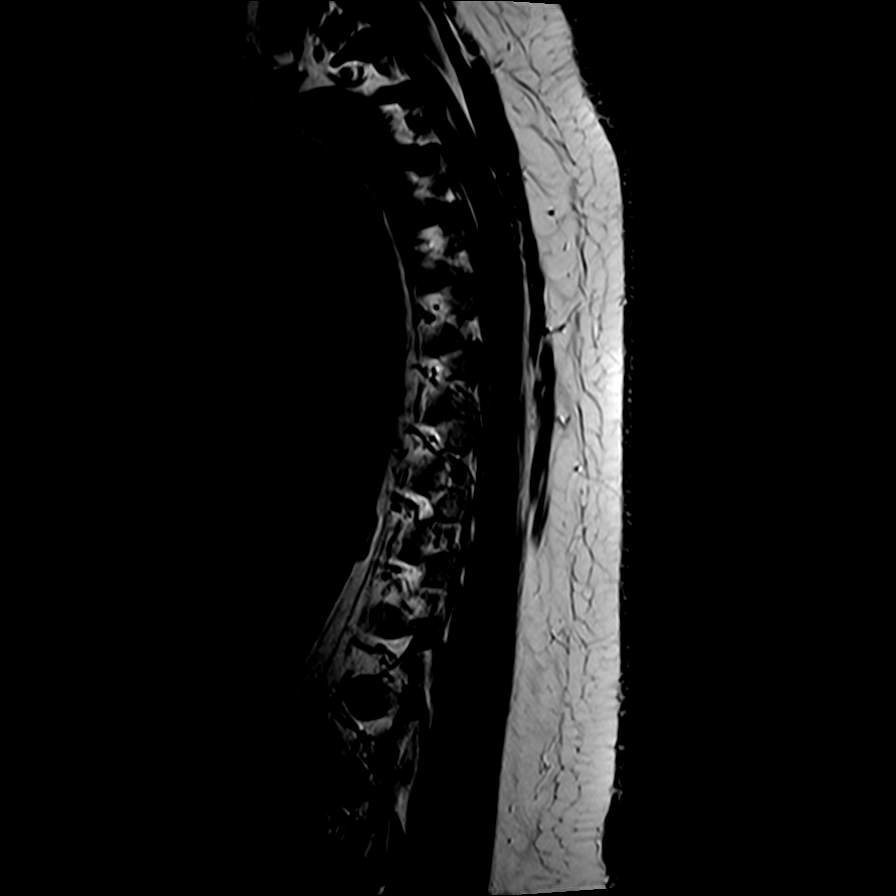
[im 8/13]
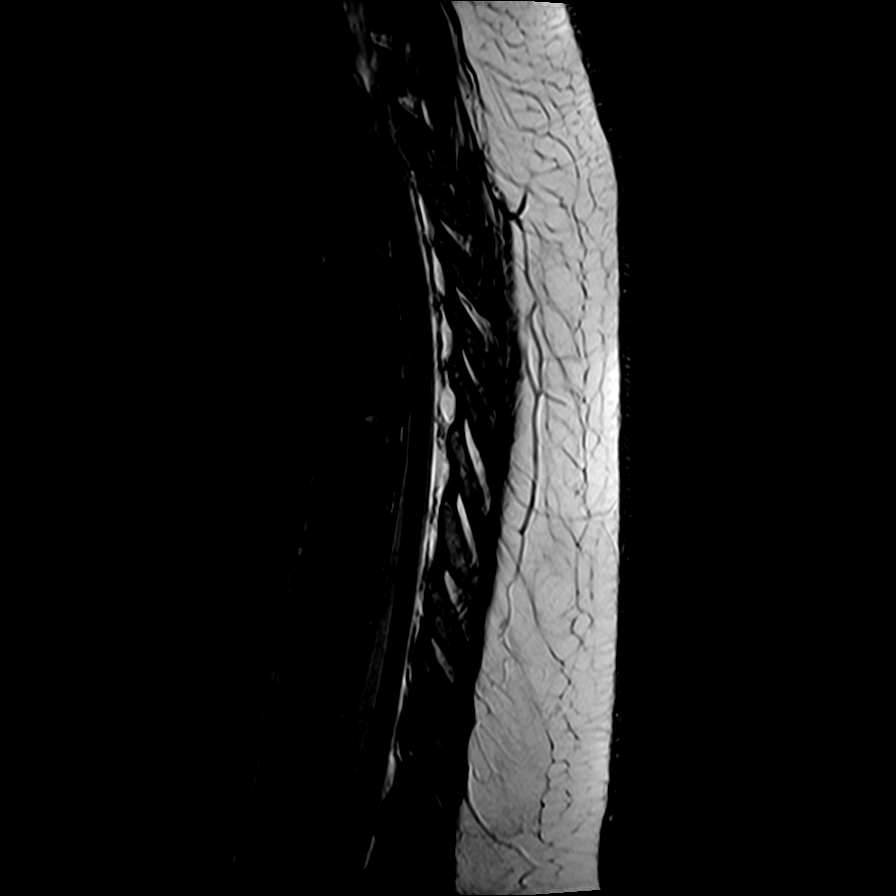
[im 13/13]
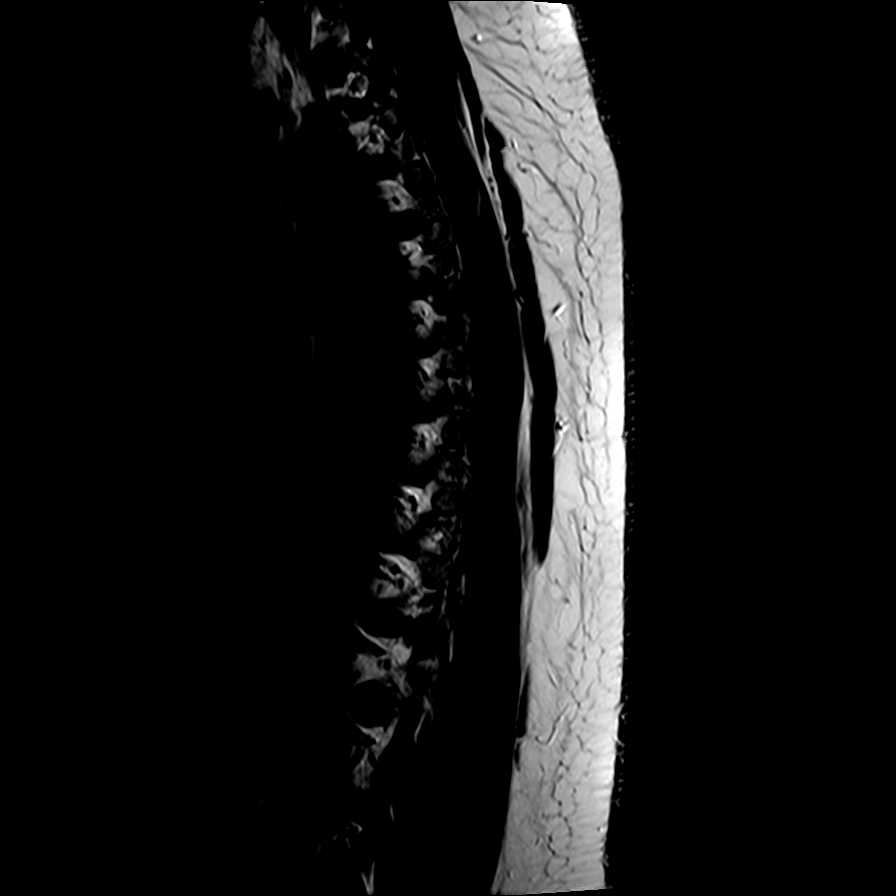

[Series 6: T1 · sagittal · 4.0mm · 0.67mm/px · 3 of 13 slices shown]
[im 3/13]
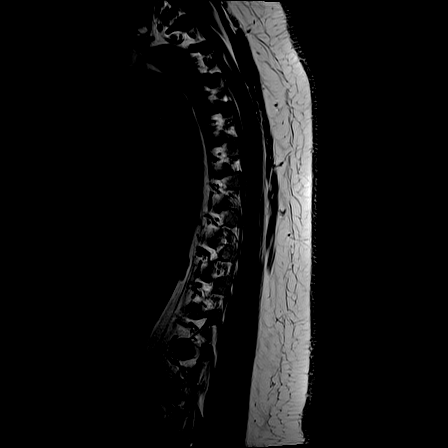
[im 8/13]
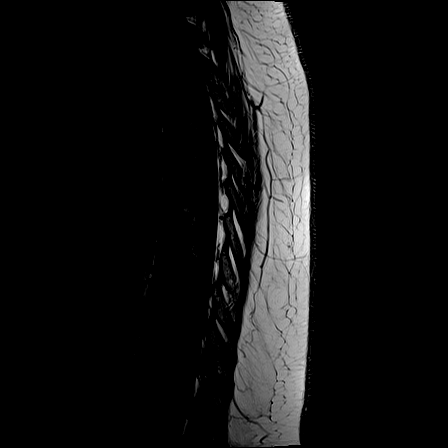
[im 13/13]
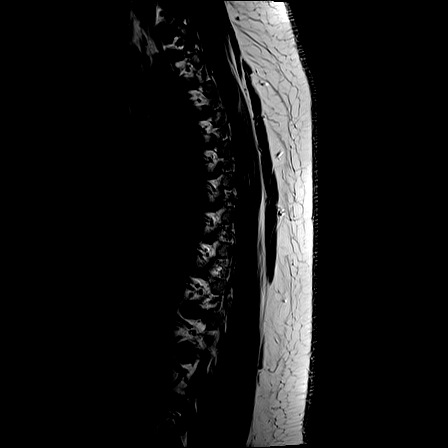

[Series 8: T2 · axial · 4.0mm · 0.39mm/px · z∈[-290,-152]mm · 3 of 33 slices shown (2 of 3)]
[im 5/33]
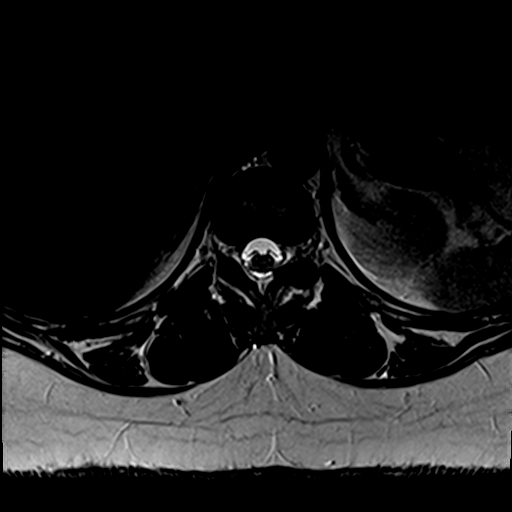
[im 18/33]
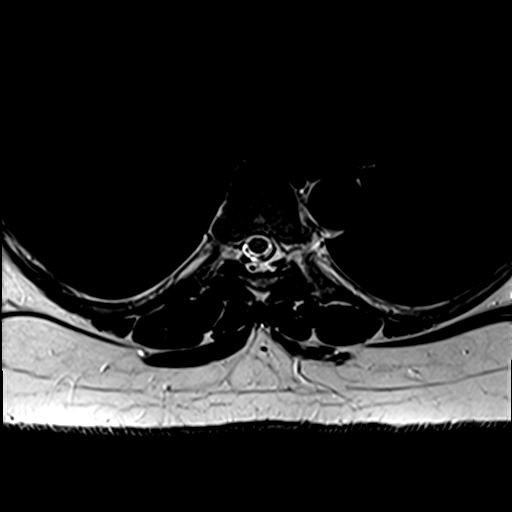
[im 28/33]
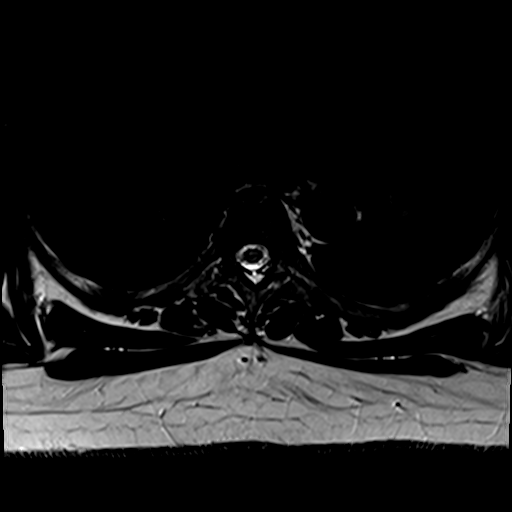

[Series 9: T2 · axial · 4.0mm · 0.39mm/px · z∈[-292,-151]mm · 3 of 33 slices shown (3 of 3)]
[im 5/33]
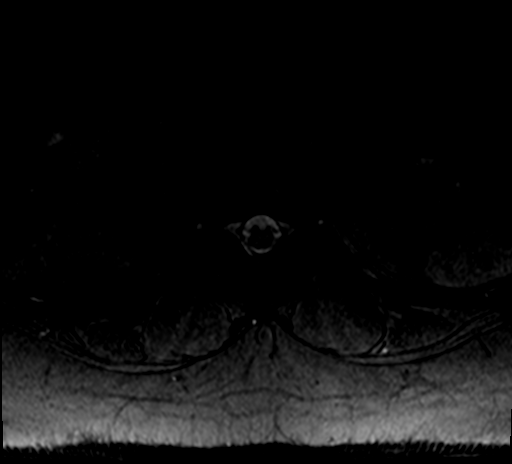
[im 18/33]
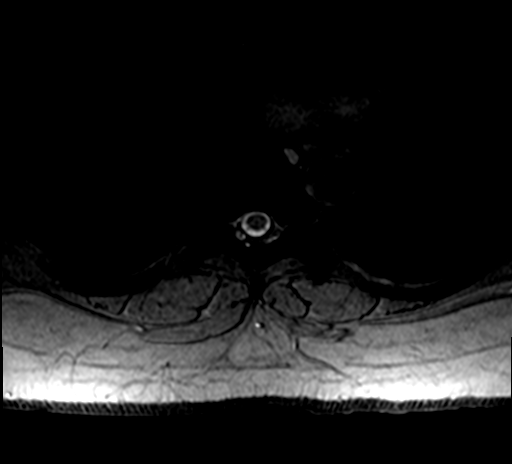
[im 28/33]
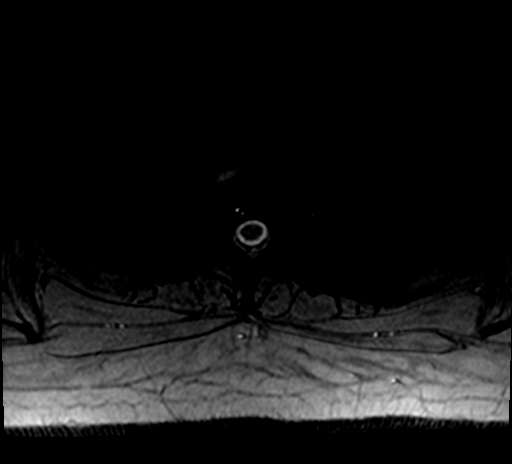

[12 of 48 positions shown; findings below may reference images not displayed]

FINDINGS: Alignment: A mild thoracic levocurvature may be positional. No
significant spondylolisthesis.

Vertebrae: Vertebral body height is maintained. Small T7 vertebral
body hemangioma. No marrow edema or suspicious osseous lesion.

Cord:  No spinal cord signal abnormality.

Paraspinal and other soft tissues: No abnormality identified within
included portions of the thorax/upper abdomen. Paraspinal soft
tissues within normal limits.

Disc levels:

Levels of mild disc degeneration greatest within the midthoracic
spine.

At T4-T5, there is a small disc bulge with superimposed small right
center/foraminal disc protrusion. The disc protrusion partially
effaces the right ventral thecal sac and likely contacts the ventral
spinal cord. Only mild relative central canal narrowing. The disc
protrusion contributes to mild right neural foraminal narrowing.

At T5-T6, tiny right foraminal disc protrusion. No significant
spinal canal or neural foraminal narrowing.

At T6-T7, minimal disc bulge. No significant spinal canal or neural
foraminal narrowing.

At T9-T10, broad-based left center/foraminal disc protrusion. The
protrusion mildly effaces the left ventral thecal sac without
significant central canal stenosis. Mild left neural foraminal
narrowing.
IMPRESSION: Thoracic spondylosis as outlined and most notably as follows.

At T4-T5, small disc bulge with superimposed small right
center/foraminal disc protrusion. The disc protrusion partially
effaces the right ventral thecal sac and likely contacts the ventral
spinal cord. Only mild relative central canal narrowing. Mild right
neural foraminal narrowing.

At T9-T10, broad-based left center/foraminal disc protrusion. Mild
left neural foraminal narrowing. The disc protrusion also mildly
effaces the left ventral thecal sac without significant central
canal stenosis.

## 2019-02-26 IMAGING — MR MR CERVICAL SPINE W/O CM
4 of 5 series · 26 of 48 positions shown · non-contrast
Comparison: Cervical spine radiographs [DATE]

CLINICAL DATA: Neck pain, spinal stenosis. Additional history
provided: Additional history provided by technologist: Patient
reports neck pain radiating into bilateral shoulders for a few
months, no known injury.

EXAM:
MRI CERVICAL SPINE WITHOUT CONTRAST
TECHNIQUE: Multiplanar, multisequence MR imaging of the cervical spine was
performed. No intravenous contrast was administered.

[Series 2: T2 · sagittal · 3.0mm · 0.41mm/px · 8 of 15 slices shown (1 of 2)]
[im 1/15]
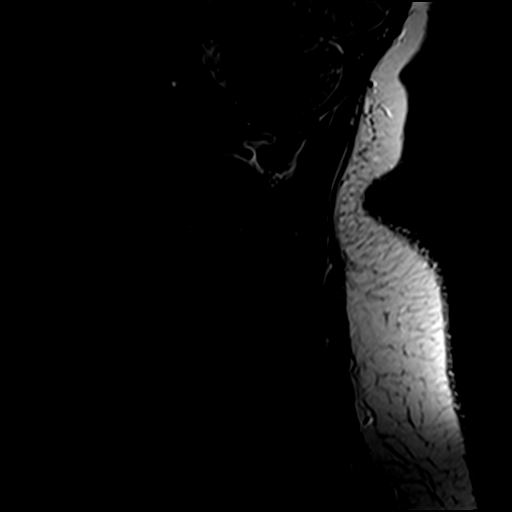
[im 3/15]
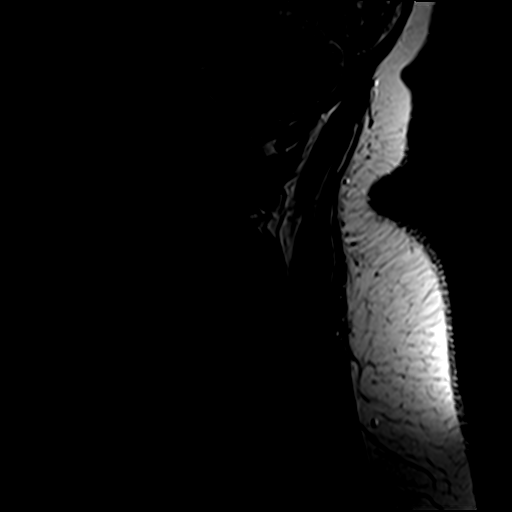
[im 5/15]
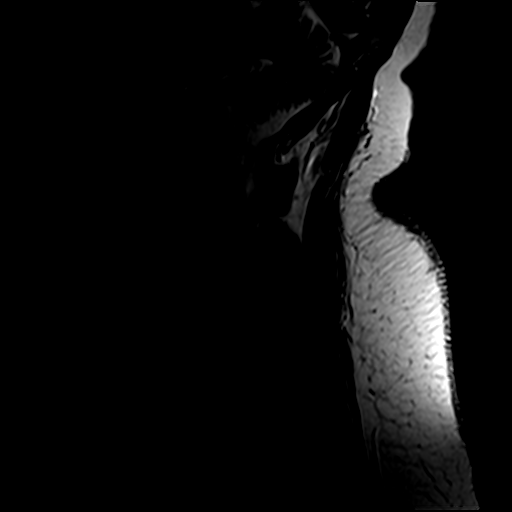
[im 7/15]
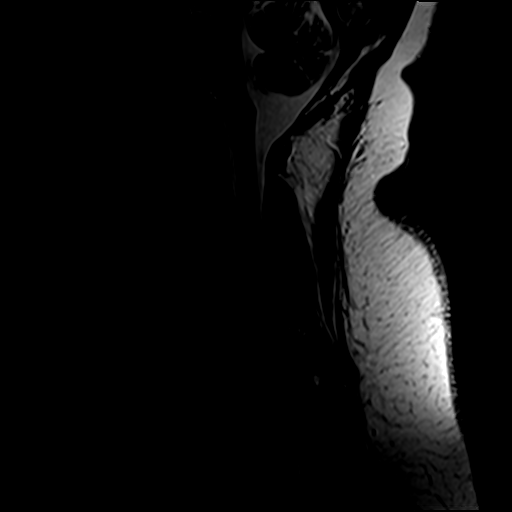
[im 9/15]
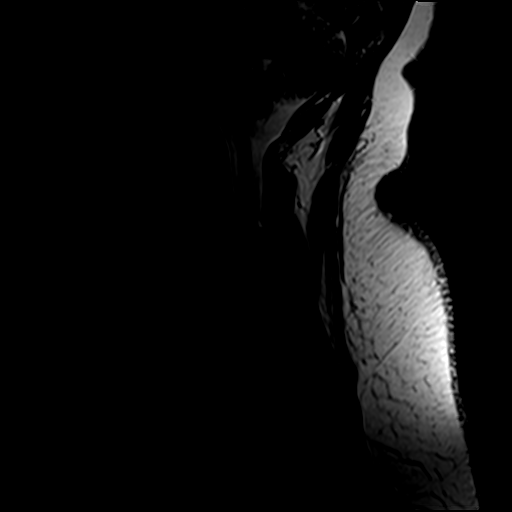
[im 11/15]
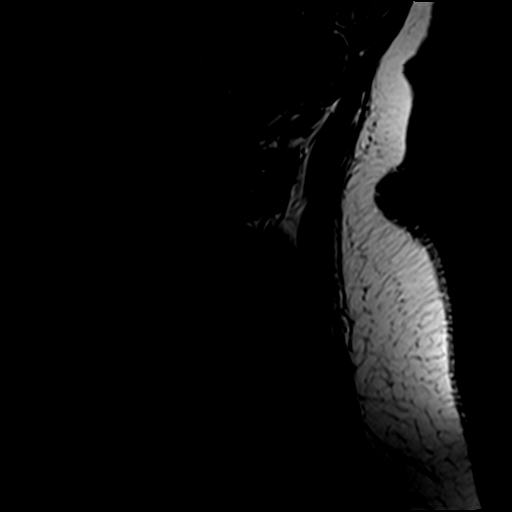
[im 13/15]
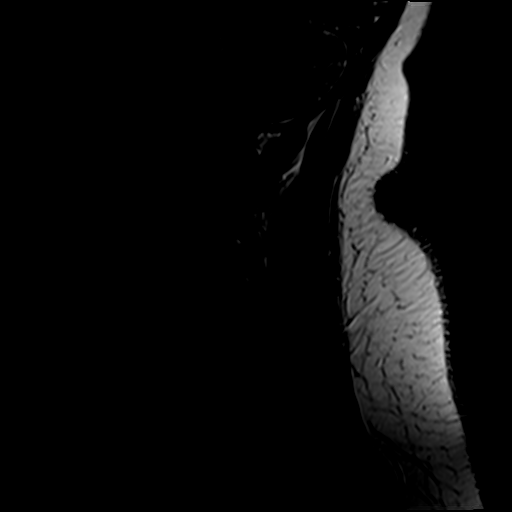
[im 15/15]
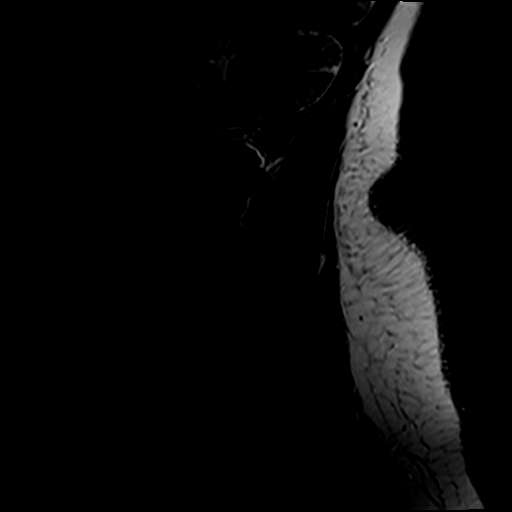

[Series 3: T1 · sagittal · 3.0mm · 0.41mm/px · 6 of 15 slices shown]
[im 1/15]
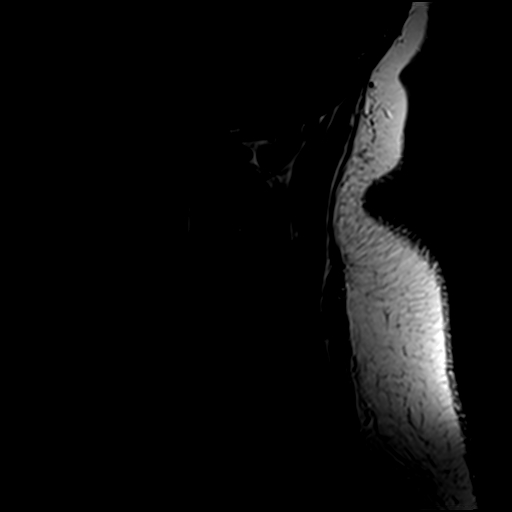
[im 3/15]
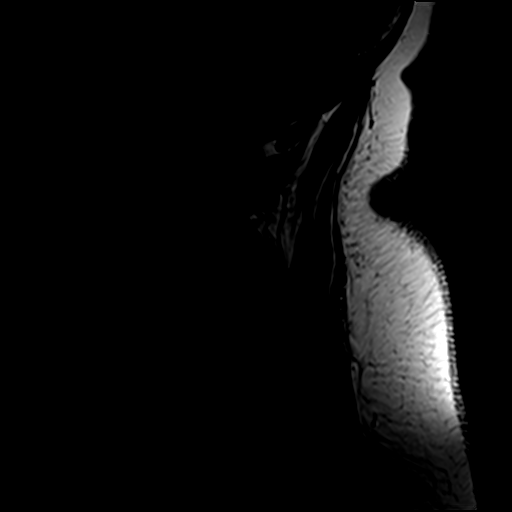
[im 5/15]
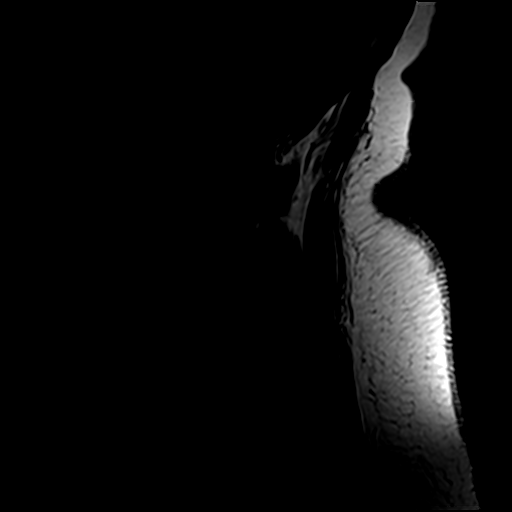
[im 8/15]
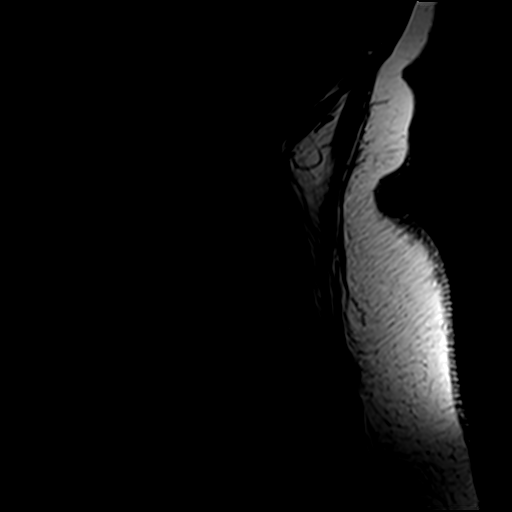
[im 10/15]
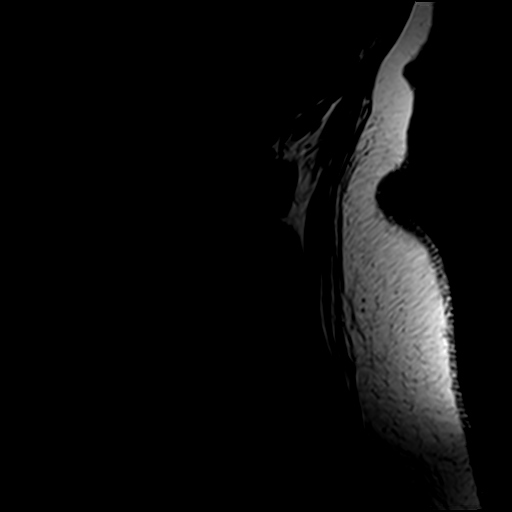
[im 12/15]
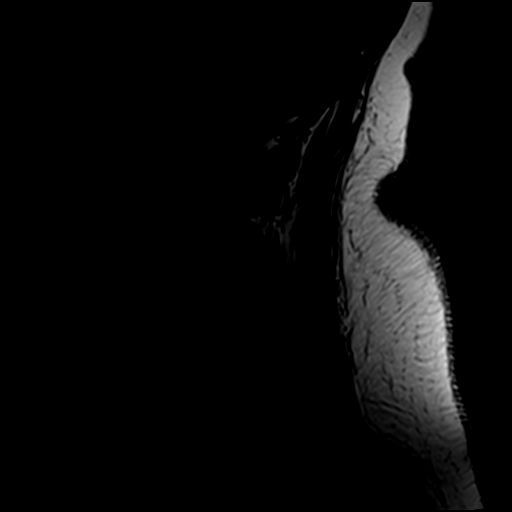

[Series 4: STIR · sagittal · 3.0mm · 0.82mm/px · 3 of 15 slices shown]
[im 3/15]
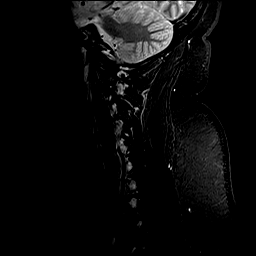
[im 8/15]
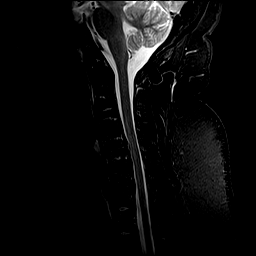
[im 12/15]
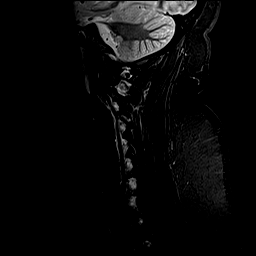

[Series 6: T2 · axial · 3.0mm · 0.70mm/px · z∈[-98,-4]mm · 9 of 26 slices shown (2 of 2)]
[im 1/26]
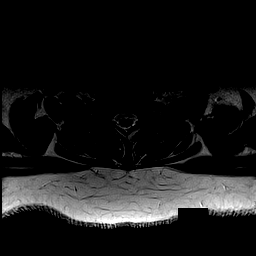
[im 5/26]
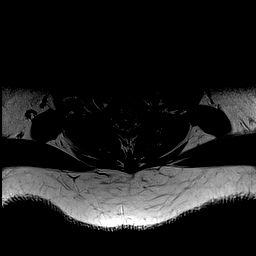
[im 9/26]
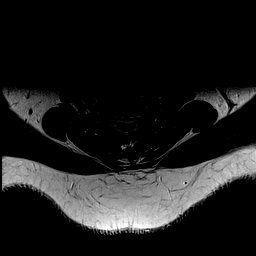
[im 11/26]
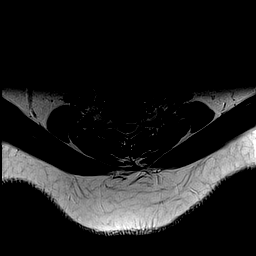
[im 13/26]
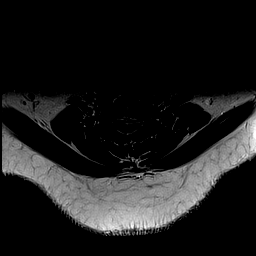
[im 15/26]
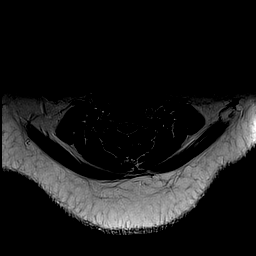
[im 17/26]
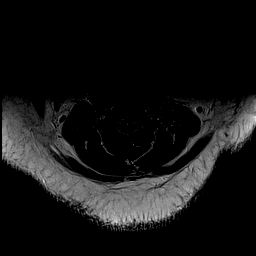
[im 21/26]
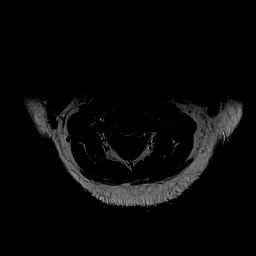
[im 26/26]
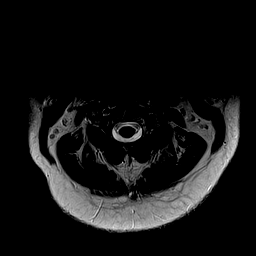

[26 of 48 positions shown; findings below may reference images not displayed]

FINDINGS: Alignment: Reversal of the expected cervical lordosis. No
significant spondylolisthesis.

Vertebrae: Vertebral body height is maintained. No marrow edema or
suspicious osseous lesion.

Cord: No spinal cord signal abnormality.

Posterior Fossa, vertebral arteries, paraspinal tissues: No signal
abnormality identified within included portions of the posterior
fossa. Flow voids preserved within the cervical vertebral arteries.
Paraspinal soft tissues within normal limits.

Disc levels:

Moderate C5-C6 and C6-C7 disc degeneration. Mild disc degeneration
at the remaining levels.

C2-C3: No disc herniation. No significant canal or foraminal
stenosis.

C3-C4: No disc herniation. No significant canal or foraminal
stenosis.

C4-C5: Disc bulge. Superimposed small T2 hyperintense central disc
protrusion. Uncinate hypertrophy. The protrusion contacts and mildly
flattens the ventral spinal cord contributing to overall mild
central canal stenosis. Mild relative left neural foraminal
narrowing.

C5-C6: Shallow disc bulge with bilateral disc osteophyte
ridge/uncinate hypertrophy. No significant spinal canal stenosis.
Mild left neural foraminal narrowing.

C6-C7: Shallow disc bulge with uncinate hypertrophy. No significant
spinal canal or neural foraminal narrowing.

C7-T1: Mild facet/ligamentum flavum hypertrophy on the left. No disc
herniation. No significant canal or foraminal stenosis.
IMPRESSION: Cervical spondylosis as outlined and most notably as follows.

At C4-C5, disc bulge with superimposed small central disc
protrusion. Uncinate hypertrophy. The disc protrusion contacts and
mildly flattens the ventral spinal cord contributing to overall mild
spinal canal stenosis. Mild left neural foraminal narrowing.

At C5-C6, disc bulge with osteophyte ridge and uncinate hypertrophy
contribute to mild left neural foraminal narrowing.

## 2019-03-03 ENCOUNTER — Ambulatory Visit: Payer: Medicaid Other | Admitting: Orthopedic Surgery

## 2019-03-03 ENCOUNTER — Other Ambulatory Visit: Payer: Self-pay

## 2019-03-03 DIAGNOSIS — M546 Pain in thoracic spine: Secondary | ICD-10-CM | POA: Diagnosis not present

## 2019-03-03 DIAGNOSIS — M542 Cervicalgia: Secondary | ICD-10-CM

## 2019-03-05 ENCOUNTER — Encounter: Payer: Self-pay | Admitting: Orthopedic Surgery

## 2019-03-05 ENCOUNTER — Telehealth: Payer: Self-pay | Admitting: Physical Medicine and Rehabilitation

## 2019-03-05 NOTE — Progress Notes (Signed)
Office Visit Note   Patient: Caroline Carlson           Date of Birth: March 21, 1980           MRN: [id number] Visit Date: 03/03/2019 Requested by: No referring provider defined for this encounter. PCP: Patient, No Pcp Per  Subjective: Chief Complaint  Patient presents with  . Follow-up    HPI: Caroline Carlson is a 39 y.o. female who presents to the office complaining of neck, T-spine, radicular pain.  She returns to discuss MRI results of the C-spine and T-spine MRIs.  She notes that the majority of her pain is in her neck, right and left thoracic spine.  This is associated with radicular pain into the right chest from the right thoracic spine as well as occasional bilateral arm radicular pain.  She denies any numbness or tingling.  She denies any significant weakness of the bilateral upper extremities.  MRI of the cervical spine revealed cervical spondylosis throughout as well as central disc bulge that contacts the ventral spinal cord with overall mild spinal canal stenosis and mild left neural foraminal narrowing at C4-C5.  MRI also found disc bulge at C5-C6 with mild left-sided neuroforaminal narrowing.  MRI of the thoracic spine found thoracic spondylosis throughout as well as a small disc bulge with right central/foraminal disc protrusion with mild central canal/neural foraminal narrowing at T4-T5 as well as findings at T9-T10 that included broad-based left central/foraminal disc protrusion contributing to mild left neuroforaminal narrowing and no significant central canal stenosis..                ROS:  All systems reviewed are negative as they relate to the chief complaint within the history of present illness.  Patient denies fevers or chills.  Assessment & Plan: Visit Diagnoses:  1. Neck pain   2. Pain in thoracic spine     Plan: Patient is a 39 year old female who presents complaining of neck and thoracic spine pain with radicular symptoms.  Discussed the MRI results with  her today.  MRI results are as described in the HPI above.  Patient has no significant weakness of the bilateral upper extremities on exam today.  Plan to refer patient to Dr. Laurence Spates for cervical spine and then thoracic spine ESI's.  If these do not significantly improve patient's symptoms then she will likely have to consider surgery.  Plan for follow-up in 5 weeks for clinical recheck and determination of next steps at that time.  No clinical evidence of myelopathy at this time.  Follow-Up Instructions: No follow-ups on file.   Orders:  Orders Placed This Encounter  Procedures  . Ambulatory referral to Physical Medicine Rehab   No orders of the defined types were placed in this encounter.     Procedures: No procedures performed   Clinical Data: No additional findings.  Objective: Vital Signs: LMP 09/11/2012   Physical Exam:  Constitutional: Patient appears well-developed HEENT:  Head: Normocephalic Eyes:EOM are normal Neck: Normal range of motion Cardiovascular: Normal rate Pulmonary/chest: Effort normal Neurologic: Patient is alert Skin: Skin is warm Psychiatric: Patient has normal mood and affect  Ortho Exam:  Tender to palpation throughout the axial cervical spine and the paraspinal musculature of the thoracic spine.  5/5 motor strength of the bilateral grip, finger abduction, supination/pronation, bicep flexion, tricep extension, deltoid abduction.  Specialty Comments:  No specialty comments available.  Imaging: No results found.   PMFS History: Patient Active Problem List   Diagnosis  Date Noted  . Obesity (BMI 30.0-34.9) 08/18/2013  . Follow-up examination, following unspecified surgery 11/16/2012  . Depression 08/14/2012   Past Medical History:  Diagnosis Date  . Anemia   . Anxiety   . Depression   . History of blood transfusion 06/2002   [facility] - unsure of number of units  . Hyperlipidemia    borderline - diet controlled  . Mental disorder     Currently depressed  . Panic attack   . PONV (postoperative nausea and vomiting)     Family History  Problem Relation Age of Onset  . Heart disease Other   . Hypertension Other     Past Surgical History:  Procedure Laterality Date  . BILATERAL SALPINGECTOMY N/A 09/25/2012   Procedure: BILATERAL SALPINGECTOMY;  Surgeon: Lahoma Crocker, MD;  Location: [facility];  Service: Gynecology;  Laterality: N/A;  . CESAREAN SECTION  2002, 2004, 2006   x 3  . ROBOTIC ASSISTED TOTAL HYSTERECTOMY N/A 09/25/2012   Procedure: ROBOTIC ASSISTED TOTAL HYSTERECTOMY;  Surgeon: Lahoma Crocker, MD;  Location: [facility];  Service: Gynecology;  Laterality: N/A;  . TUBAL LIGATION     Social History   Occupational History  . Not on file  Tobacco Use  . Smoking status: Former Smoker    Packs/day: 0.20    Years: 0.50    Pack years: 0.10    Types: Cigarettes  . Smokeless tobacco: Never Used  . Tobacco comment: trying to quit  Substance and Sexual Activity  . Alcohol use: No  . Drug use: No  . Sexual activity: Yes    Birth control/protection: Surgical

## 2019-03-11 ENCOUNTER — Encounter: Payer: Self-pay | Admitting: Orthopedic Surgery

## 2019-03-12 ENCOUNTER — Other Ambulatory Visit: Payer: Self-pay | Admitting: Physical Medicine and Rehabilitation

## 2019-03-12 DIAGNOSIS — F411 Generalized anxiety disorder: Secondary | ICD-10-CM

## 2019-03-12 MED ORDER — DIAZEPAM 5 MG PO TABS: ORAL_TABLET | ORAL | 0 refills | Status: DC

## 2019-03-12 NOTE — Progress Notes (Signed)
Pre-procedure diazepam ordered for pre-operative anxiety.  

## 2019-03-16 ENCOUNTER — Telehealth: Payer: Self-pay | Admitting: *Deleted

## 2019-03-16 ENCOUNTER — Ambulatory Visit: Payer: Medicaid Other | Admitting: Physical Medicine and Rehabilitation

## 2019-03-16 ENCOUNTER — Other Ambulatory Visit: Payer: Self-pay

## 2019-03-16 ENCOUNTER — Ambulatory Visit: Payer: Self-pay

## 2019-03-16 ENCOUNTER — Encounter: Payer: Self-pay | Admitting: Physical Medicine and Rehabilitation

## 2019-03-16 VITALS — BP 134/83 | HR 75

## 2019-03-16 DIAGNOSIS — M5010 Cervical disc disorder with radiculopathy, unspecified cervical region: Secondary | ICD-10-CM

## 2019-03-16 MED ORDER — METHYLPREDNISOLONE ACETATE 80 MG/ML IJ SUSP
40.0000 mg | Freq: Once | INTRAMUSCULAR | Status: AC
  Administered 2019-03-16: 40 mg

## 2019-03-16 NOTE — Progress Notes (Signed)
Numeric Pain Rating Scale and Functional Assessment Average Pain 10   In the last MONTH (on 0-10 scale) has pain interfered with the following?  1. General activity like being  able to carry out your everyday physical activities such as walking, climbing stairs, carrying groceries, or moving a chair?  Rating(10)   +Driver, -BT, -Dye Allergies. 

## 2019-03-17 NOTE — Telephone Encounter (Signed)
Closing encounter

## 2019-03-19 ENCOUNTER — Other Ambulatory Visit: Payer: Self-pay | Admitting: Physical Medicine and Rehabilitation

## 2019-03-19 DIAGNOSIS — F411 Generalized anxiety disorder: Secondary | ICD-10-CM

## 2019-03-19 MED ORDER — DIAZEPAM 5 MG PO TABS: ORAL_TABLET | ORAL | 0 refills | Status: DC

## 2019-03-19 NOTE — Telephone Encounter (Signed)
Called pt and advised.  

## 2019-03-19 NOTE — Progress Notes (Signed)
Pre-procedure diazepam ordered for pre-operative anxiety.  

## 2019-03-19 NOTE — Telephone Encounter (Signed)
done

## 2019-04-07 ENCOUNTER — Ambulatory Visit (INDEPENDENT_AMBULATORY_CARE_PROVIDER_SITE_OTHER): Payer: Medicaid Other | Admitting: Physical Medicine and Rehabilitation

## 2019-04-07 ENCOUNTER — Encounter: Payer: Self-pay | Admitting: Physical Medicine and Rehabilitation

## 2019-04-07 ENCOUNTER — Other Ambulatory Visit: Payer: Self-pay

## 2019-04-07 ENCOUNTER — Ambulatory Visit: Payer: Self-pay

## 2019-04-07 VITALS — BP 112/81 | HR 83

## 2019-04-07 DIAGNOSIS — M5412 Radiculopathy, cervical region: Secondary | ICD-10-CM

## 2019-04-07 MED ORDER — METHYLPREDNISOLONE ACETATE 80 MG/ML IJ SUSP
40.0000 mg | Freq: Once | INTRAMUSCULAR | Status: AC
  Administered 2019-04-07: 40 mg

## 2019-04-07 NOTE — Procedures (Signed)
Cervical Epidural Steroid Injection - Interlaminar Approach with Fluoroscopic Guidance  Patient: Caroline Carlson      Date of Birth: 1980/05/04 MRN: [id number] PCP: Patient, No Pcp Per      Visit Date: 03/16/2019   Universal Protocol:    Date/Time: 04/07/2110:53 PM  Consent Given By: the patient  Position: PRONE  Additional Comments: Vital signs were monitored before and after the procedure. Patient was prepped and draped in the usual sterile fashion. The correct patient, procedure, and site was verified.   Injection Procedure Details:  Procedure Site One Meds Administered:  Meds ordered this encounter  Medications  . methylPREDNISolone acetate (DEPO-MEDROL) injection 40 mg     Laterality: Right  Location/Site: C7-T1  Needle size: 20 G  Needle type: Touhy  Needle Placement: Paramedian epidural space  Findings:  -Comments: Excellent flow of contrast into the epidural space.  Procedure Details: Using a paramedian approach from the side mentioned above, the region overlying the inferior lamina was localized under fluoroscopic visualization and the soft tissues overlying this structure were infiltrated with 4 ml. of 1% Lidocaine without Epinephrine. A # 20 gauge, Tuohy needle was inserted into the epidural space using a paramedian approach.  The epidural space was localized using loss of resistance along with lateral and contralateral oblique bi-planar fluoroscopic views.  After negative aspirate for air, blood, and CSF, a 2 ml. volume of Isovue-250 was injected into the epidural space and the flow of contrast was observed. Radiographs were obtained for documentation purposes.   The injectate was administered into the level noted above.  Additional Comments:  The patient tolerated the procedure well Dressing: 2 x 2 sterile gauze and Band-Aid    Post-procedure details: Patient was observed during the procedure. Post-procedure instructions were reviewed.  Patient  left the clinic in stable condition.

## 2019-04-07 NOTE — Progress Notes (Signed)
   Numeric Pain Rating Scale and Functional Assessment Average Pain (8)   In the last MONTH (on 0-10 scale) has pain interfered with the following?  1. General activity like being  able to carry out your everyday physical activities such as walking, climbing stairs, carrying groceries, or moving a chair?  Rating(9)   +Driver, -BT, -Dye Allergies.  

## 2019-04-07 NOTE — Progress Notes (Signed)
Caroline Carlson - 39 y.o. female MRN [id number]  Date of birth: 08/26/1980  Office Visit Note: Visit Date: 03/16/2019 PCP: Patient, No Pcp Per Referred by: Meredith Pel, MD  Subjective: Chief Complaint  Patient presents with  . Neck - Pain  . Middle Back - Pain  . Lower Back - Pain  . Right Shoulder - Pain   HPI:  Caroline Carlson is a 39 y.o. female who comes in today For planned right C7-T1 interlaminar epidural steroid injection was requested by Dr. Landry Dyke.  Patient is having neck and right shoulder and mid back pain.  MRI reviewed with the patient today and reviewed below.  The patient has failed conservative care including home exercise, medications, time and activity modification.  This injection will be diagnostic and hopefully therapeutic.  Please see requesting physician notes for further details and justification.   ROS Otherwise per HPI.  Assessment & Plan: Visit Diagnoses:  1. Cervical disc disorder with radiculopathy     Plan: No additional findings.   Meds & Orders:  Meds ordered this encounter  Medications  . methylPREDNISolone acetate (DEPO-MEDROL) injection 40 mg    Orders Placed This Encounter  Procedures  . XR C-ARM NO REPORT  . Epidural Steroid injection    Follow-up: Return if symptoms worsen or fail to improve.   Procedures: No procedures performed  Cervical Epidural Steroid Injection - Interlaminar Approach with Fluoroscopic Guidance  Patient: Caroline Carlson      Date of Birth: 1980/11/06 MRN: [id number] PCP: Patient, No Pcp Per      Visit Date: 03/16/2019   Universal Protocol:    Date/Time: 04/07/2110:53 PM  Consent Given By: the patient  Position: PRONE  Additional Comments: Vital signs were monitored before and after the procedure. Patient was prepped and draped in the usual sterile fashion. The correct patient, procedure, and site was verified.   Injection Procedure Details:  Procedure Site One Meds  Administered:  Meds ordered this encounter  Medications  . methylPREDNISolone acetate (DEPO-MEDROL) injection 40 mg     Laterality: Right  Location/Site: C7-T1  Needle size: 20 G  Needle type: Touhy  Needle Placement: Paramedian epidural space  Findings:  -Comments: Excellent flow of contrast into the epidural space.  Procedure Details: Using a paramedian approach from the side mentioned above, the region overlying the inferior lamina was localized under fluoroscopic visualization and the soft tissues overlying this structure were infiltrated with 4 ml. of 1% Lidocaine without Epinephrine. A # 20 gauge, Tuohy needle was inserted into the epidural space using a paramedian approach.  The epidural space was localized using loss of resistance along with lateral and contralateral oblique bi-planar fluoroscopic views.  After negative aspirate for air, blood, and CSF, a 2 ml. volume of Isovue-250 was injected into the epidural space and the flow of contrast was observed. Radiographs were obtained for documentation purposes.   The injectate was administered into the level noted above.  Additional Comments:  The patient tolerated the procedure well Dressing: 2 x 2 sterile gauze and Band-Aid    Post-procedure details: Patient was observed during the procedure. Post-procedure instructions were reviewed.  Patient left the clinic in stable condition.     Clinical History: MRI CERVICAL SPINE WITHOUT CONTRAST  TECHNIQUE: Multiplanar, multisequence MR imaging of the cervical spine was performed. No intravenous contrast was administered.  COMPARISON:  Cervical spine radiographs 02/04/2019  FINDINGS: Alignment: Reversal of the expected cervical lordosis. No significant spondylolisthesis.  Vertebrae: Vertebral body  height is maintained. No marrow edema or suspicious osseous lesion.  Cord: No spinal cord signal abnormality.  Posterior Fossa, vertebral arteries, paraspinal  tissues: No signal abnormality identified within included portions of the posterior fossa. Flow voids preserved within the cervical vertebral arteries. Paraspinal soft tissues within normal limits.  Disc levels:  Moderate C5-C6 and C6-C7 disc degeneration. Mild disc degeneration at the remaining levels.  C2-C3: No disc herniation. No significant canal or foraminal stenosis.  C3-C4: No disc herniation. No significant canal or foraminal stenosis.  C4-C5: Disc bulge. Superimposed small T2 hyperintense central disc protrusion. Uncinate hypertrophy. The protrusion contacts and mildly flattens the ventral spinal cord contributing to overall mild central canal stenosis. Mild relative left neural foraminal narrowing.  C5-C6: Shallow disc bulge with bilateral disc osteophyte ridge/uncinate hypertrophy. No significant spinal canal stenosis. Mild left neural foraminal narrowing.  C6-C7: Shallow disc bulge with uncinate hypertrophy. No significant spinal canal or neural foraminal narrowing.  C7-T1: Mild facet/ligamentum flavum hypertrophy on the left. No disc herniation. No significant canal or foraminal stenosis.  IMPRESSION: Cervical spondylosis as outlined and most notably as follows.  At C4-C5, disc bulge with superimposed small central disc protrusion. Uncinate hypertrophy. The disc protrusion contacts and mildly flattens the ventral spinal cord contributing to overall mild spinal canal stenosis. Mild left neural foraminal narrowing.  At C5-C6, disc bulge with osteophyte ridge and uncinate hypertrophy contribute to mild left neural foraminal narrowing.   Electronically Signed   By: Kellie Simmering DO   On: 02/26/2019 09:23     Objective:  VS:  HT:    WT:   BMI:     BP:134/83  HR:75bpm  TEMP: ( )  RESP:  Physical Exam  Ortho Exam Imaging: No results found.

## 2019-04-08 NOTE — Procedures (Signed)
Thoracic epidural Steroid Injection - Interlaminar Approach with Fluoroscopic Guidance  Patient: Caroline Carlson      Date of Birth: 16-Aug-1980 MRN: [id number] PCP: Patient, No Pcp Per      Visit Date: 04/07/2019   Universal Protocol:    Date/Time: 03/25/215:35 AM  Consent Given By: the patient  Position: PRONE  Additional Comments: Vital signs were monitored before and after the procedure. Patient was prepped and draped in the usual sterile fashion. The correct patient, procedure, and site was verified.   Injection Procedure Details:  Procedure Site One Meds Administered:  Meds ordered this encounter  Medications  . methylPREDNISolone acetate (DEPO-MEDROL) injection 40 mg     Laterality: Right  Location/Site: T2-3  Needle size: 18 G  Needle type: Touhy  Needle Placement: Paramedian epidural space  Findings:  -Comments: Excellent flow of contrast into the epidural space.  Procedure Details: Using a paramedian approach from the side mentioned above, the region overlying the inferior lamina was localized under fluoroscopic visualization and the soft tissues overlying this structure were infiltrated with 4 ml. of 1% Lidocaine without Epinephrine. A # 20 gauge, Tuohy needle was inserted into the epidural space using a paramedian approach.  The epidural space was localized using loss of resistance along with lateral and contralateral oblique bi-planar fluoroscopic views.  After negative aspirate for air, blood, and CSF, a 2 ml. volume of Isovue-250 was injected into the epidural space and the flow of contrast was observed. Radiographs were obtained for documentation purposes.   The injectate was administered into the level noted above.  Additional Comments:  The patient tolerated the procedure well Dressing: 2 x 2 sterile gauze and Band-Aid    Post-procedure details: Patient was observed during the procedure. Post-procedure instructions were reviewed.  Patient  left the clinic in stable condition.

## 2019-04-08 NOTE — Progress Notes (Signed)
Caroline Carlson - 39 y.o. female MRN [id number]  Date of birth: [birth year]  Office Visit Note: Visit Date: 04/07/2019 PCP: Patient, No Pcp Per Referred by: No ref. provider found  Subjective: Chief Complaint  Patient presents with  . Neck - Pain  . Middle Back - Pain  . Lower Back - Pain   HPI:  Caroline Carlson is a 39 y.o. female who comes in today For planned upper thoracic epidural injection status post cervical epidural injection without much relief.  Please see our prior notes for further details justification.  Exam shows that she does have myofascial trigger points and pain with movement of the shoulder and shoulder blade.  If she does not get relief today with the epidural injection I would think this points more to an extraspinal source of her pain.  We discussed physical therapy with dry needling.  I had be happy to refer her for that where she could follow-up with Dr. Marlou Sa for evaluation and management.  ROS Otherwise per HPI.  Assessment & Plan: Visit Diagnoses:  1. Cervical radiculopathy     Plan: No additional findings.   Meds & Orders:  Meds ordered this encounter  Medications  . methylPREDNISolone acetate (DEPO-MEDROL) injection 40 mg    Orders Placed This Encounter  Procedures  . XR C-ARM NO REPORT  . Epidural Steroid injection    Follow-up: Return if symptoms worsen or fail to improve, for Consider PT/Chiro.   Procedures: No procedures performed  Thoracic epidural Steroid Injection - Interlaminar Approach with Fluoroscopic Guidance  Patient: Caroline Carlson      Date of Birth: 1981-01-05 MRN: [id number] PCP: Patient, No Pcp Per      Visit Date: 04/07/2019   Universal Protocol:    Date/Time: 03/25/215:35 AM  Consent Given By: the patient  Position: PRONE  Additional Comments: Vital signs were monitored before and after the procedure. Patient was prepped and draped in the usual sterile fashion. The correct patient, procedure, and site  was verified.   Injection Procedure Details:  Procedure Site One Meds Administered:  Meds ordered this encounter  Medications  . methylPREDNISolone acetate (DEPO-MEDROL) injection 40 mg     Laterality: Right  Location/Site: T2-3  Needle size: 18 G  Needle type: Touhy  Needle Placement: Paramedian epidural space  Findings:  -Comments: Excellent flow of contrast into the epidural space.  Procedure Details: Using a paramedian approach from the side mentioned above, the region overlying the inferior lamina was localized under fluoroscopic visualization and the soft tissues overlying this structure were infiltrated with 4 ml. of 1% Lidocaine without Epinephrine. A # 20 gauge, Tuohy needle was inserted into the epidural space using a paramedian approach.  The epidural space was localized using loss of resistance along with lateral and contralateral oblique bi-planar fluoroscopic views.  After negative aspirate for air, blood, and CSF, a 2 ml. volume of Isovue-250 was injected into the epidural space and the flow of contrast was observed. Radiographs were obtained for documentation purposes.   The injectate was administered into the level noted above.  Additional Comments:  The patient tolerated the procedure well Dressing: 2 x 2 sterile gauze and Band-Aid    Post-procedure details: Patient was observed during the procedure. Post-procedure instructions were reviewed.  Patient left the clinic in stable condition.    Clinical History: MRI CERVICAL SPINE WITHOUT CONTRAST    TECHNIQUE:  Multiplanar, multisequence MR imaging of the cervical spine was  performed. No intravenous contrast was administered.  COMPARISON: Cervical spine radiographs 02/04/2019    FINDINGS:  Alignment: Reversal of the expected cervical lordosis. No  significant spondylolisthesis.    Vertebrae: Vertebral body height is maintained. No marrow edema or  suspicious osseous lesion.    Cord:  No spinal cord signal abnormality.    Posterior Fossa, vertebral arteries, paraspinal tissues: No signal  abnormality identified within included portions of the posterior  fossa. Flow voids preserved within the cervical vertebral arteries.  Paraspinal soft tissues within normal limits.    Disc levels:    Moderate C5-C6 and C6-C7 disc degeneration. Mild disc degeneration  at the remaining levels.    C2-C3: No disc herniation. No significant canal or foraminal  stenosis.    C3-C4: No disc herniation. No significant canal or foraminal  stenosis.    C4-C5: Disc bulge. Superimposed small T2 hyperintense central disc  protrusion. Uncinate hypertrophy. The protrusion contacts and mildly  flattens the ventral spinal cord contributing to overall mild  central canal stenosis. Mild relative left neural foraminal  narrowing.    C5-C6: Shallow disc bulge with bilateral disc osteophyte  ridge/uncinate hypertrophy. No significant spinal canal stenosis.  Mild left neural foraminal narrowing.    C6-C7: Shallow disc bulge with uncinate hypertrophy. No significant  spinal canal or neural foraminal narrowing.    C7-T1: Mild facet/ligamentum flavum hypertrophy on the left. No disc  herniation. No significant canal or foraminal stenosis.    IMPRESSION:  Cervical spondylosis as outlined and most notably as follows.    At C4-C5, disc bulge with superimposed small central disc  protrusion. Uncinate hypertrophy. The disc protrusion contacts and  mildly flattens the ventral spinal cord contributing to overall mild  spinal canal stenosis. Mild left neural foraminal narrowing.    At C5-C6, disc bulge with osteophyte ridge and uncinate hypertrophy  contribute to mild left neural foraminal narrowing.      Electronically Signed  By: Kellie Simmering DO  On: 02/26/2019 09:23 -- MRI THORACIC SPINE WITHOUT CONTRAST  TECHNIQUE: Multiplanar, multisequence MR imaging of the thoracic  spine was performed. No intravenous contrast was administered.  COMPARISON:  Concurrently performed MRI of the cervical spine, thoracic spine radiographs 11/23/2018  FINDINGS:   At T4-T5, there is a small disc bulge with superimposed small right center/foraminal disc protrusion. The disc protrusion partially effaces the right ventral thecal sac and likely contacts the ventral spinal cord. Only mild relative central canal narrowing. The disc protrusion contributes to mild right neural foraminal narrowing.  At T5-T6, tiny right foraminal disc protrusion. No significant spinal canal or neural foraminal narrowing.  At T6-T7, minimal disc bulge. No significant spinal canal or neural foraminal narrowing.  At T9-T10, broad-based left center/foraminal disc protrusion. The protrusion mildly effaces the left ventral thecal sac without significant central canal stenosis. Mild left neural foraminal narrowing.  IMPRESSION: Thoracic spondylosis as outlined and most notably as follows.  At T4-T5, small disc bulge with superimposed small right center/foraminal disc protrusion. The disc protrusion partially effaces the right ventral thecal sac and likely contacts the ventral spinal cord. Only mild relative central canal narrowing. Mild right neural foraminal narrowing.  At T9-T10, broad-based left center/foraminal disc protrusion. Mild left neural foraminal narrowing. The disc protrusion also mildly effaces the left ventral thecal sac without significant central canal stenosis.   Electronically Signed   By: Kellie Simmering DO   On: 02/26/2019 09:39     Objective:  VS:  HT:    WT:   BMI:     BP:112/81  HR:83bpm  TEMP: ( )  RESP:  Physical Exam  Ortho Exam Imaging: XR C-ARM NO REPORT  Result Date: 04/07/2019 Please see Notes tab for imaging impression.

## 2019-04-13 ENCOUNTER — Encounter: Payer: Self-pay | Admitting: Physical Medicine and Rehabilitation

## 2019-04-27 ENCOUNTER — Encounter: Payer: Self-pay | Admitting: Physical Medicine and Rehabilitation

## 2019-04-27 ENCOUNTER — Ambulatory Visit (INDEPENDENT_AMBULATORY_CARE_PROVIDER_SITE_OTHER): Payer: Medicaid Other | Admitting: Physical Medicine and Rehabilitation

## 2019-04-27 ENCOUNTER — Other Ambulatory Visit: Payer: Self-pay

## 2019-04-27 ENCOUNTER — Ambulatory Visit (INDEPENDENT_AMBULATORY_CARE_PROVIDER_SITE_OTHER): Payer: Medicaid Other

## 2019-04-27 VITALS — BP 120/85 | HR 75 | Ht 65.0 in | Wt 222.0 lb

## 2019-04-27 DIAGNOSIS — M47816 Spondylosis without myelopathy or radiculopathy, lumbar region: Secondary | ICD-10-CM | POA: Diagnosis not present

## 2019-04-27 DIAGNOSIS — M25552 Pain in left hip: Secondary | ICD-10-CM

## 2019-04-27 DIAGNOSIS — M5442 Lumbago with sciatica, left side: Secondary | ICD-10-CM | POA: Diagnosis not present

## 2019-04-27 DIAGNOSIS — M545 Low back pain, unspecified: Secondary | ICD-10-CM

## 2019-04-27 DIAGNOSIS — G8929 Other chronic pain: Secondary | ICD-10-CM

## 2019-04-27 MED ORDER — METHOCARBAMOL 500 MG PO TABS: 500.0000 mg | ORAL_TABLET | Freq: Three times a day (TID) | ORAL | 0 refills | Status: DC | PRN

## 2019-04-27 MED ORDER — DULOXETINE HCL 30 MG PO CPEP: 30.0000 mg | ORAL_CAPSULE | Freq: Every day | ORAL | 1 refills | Status: DC

## 2019-04-27 NOTE — Progress Notes (Signed)
Caroline Carlson - 39 y.o. female MRN [id number]  Date of birth: 12/02/1980  Office Visit Note: Visit Date: 04/27/2019 PCP: Patient, No Pcp Per Referred by: No ref. provider found  Subjective: Chief Complaint  Patient presents with  . Lower Back - Pain   HPI: Caroline Carlson is a 39 y.o. female who comes in today For evaluation management of worsening low back pain chronic for several years and worsening over the last several months with some referral into the left buttocks.  She is followed typically by Dr. Anderson Malta and his physician assistant [facility].  I have seen her on occasion for cervical and thoracic spine interventional procedure.  She did ask if I would see her for her lower back the last time we saw her.  She rates her average pain is a 9 out of 10 is intermittent severe but always constant always dull and aching.  Is worse with walking and bending is better with heat ice and medication.  She reports some referral into the left buttocks but not down the leg.  No paresthesias no focal weakness.  No specific injury.  Her case is complicated by morbid obesity as well as depression anxiety.  She does have some level of myofascial pain syndrome in the cervical and thoracic spine.  She has had medication trials and other conservative management including time.  She has not had any epidural injection or other interventional spine procedures of the lower back.  No prior lumbar surgery.  She has no advanced imaging of the lumbar spine and really no x-rays of the lumbar spine.  X-rays were completed today and reviewed below and reviewed with the patient.  Review of Systems  Musculoskeletal: Positive for back pain, joint pain and neck pain.  All other systems reviewed and are negative.  Otherwise per HPI.  Assessment & Plan: Visit Diagnoses:  1. Acute left-sided low back pain without sciatica   2. Spondylosis without myelopathy or radiculopathy, lumbar region   3. Chronic  bilateral low back pain with left-sided sciatica   4. Pain in left hip     Plan: Findings:  Chronic pain syndrome with chronic pain in the neck thoracic and lower back.  Evaluation today more for her lower back.  This is been an ongoing problem for years off and on.  It does seem to be somewhat radicular potentially into the left buttock region.  I feel like some of her pain is facet mediated.  X-rays do show facet arthritis.  Really more findings in her lumbar spine and the other MRIs of the cervical and thoracic area.  I feel like she does have some kind of potential for a central sensitization type issue such as fibromyalgia.  I think the best approach today is to start duloxetine and I went over this quite a lot with her to see if it would help.  She was somewhat tearful today she does carry a diagnosis of depression.  I also Minna start a muscle relaxer to see if that will help.  Depending on relief could potentially entertain the idea of a one-time epidural injection.  Would also like to see lumbar spine MRI if this discontinued.  She will continue to follow-up with Dr. Marlou Sa.    Meds & Orders:  Meds ordered this encounter  Medications  . DULoxetine (CYMBALTA) 30 MG capsule    Sig: Take 1 capsule (30 mg total) by mouth daily after supper. For 7 nights then take  2 capsules daily at night    Dispense:  60 capsule    Refill:  1  . methocarbamol (ROBAXIN) 500 MG tablet    Sig: Take 1 tablet (500 mg total) by mouth every 8 (eight) hours as needed for muscle spasms.    Dispense:  30 tablet    Refill:  0    Orders Placed This Encounter  Procedures  . XR Lumbar Spine Complete    Follow-up: Return if symptoms worsen or fail to improve.   Procedures: No procedures performed  No notes on file   Clinical History: MRI CERVICAL SPINE WITHOUT CONTRAST    TECHNIQUE:  Multiplanar, multisequence MR imaging of the cervical spine was  performed. No intravenous contrast was administered.      COMPARISON: Cervical spine radiographs 02/04/2019    FINDINGS:  Alignment: Reversal of the expected cervical lordosis. No  significant spondylolisthesis.    Vertebrae: Vertebral body height is maintained. No marrow edema or  suspicious osseous lesion.    Cord: No spinal cord signal abnormality.    Posterior Fossa, vertebral arteries, paraspinal tissues: No signal  abnormality identified within included portions of the posterior  fossa. Flow voids preserved within the cervical vertebral arteries.  Paraspinal soft tissues within normal limits.    Disc levels:    Moderate C5-C6 and C6-C7 disc degeneration. Mild disc degeneration  at the remaining levels.    C2-C3: No disc herniation. No significant canal or foraminal  stenosis.    C3-C4: No disc herniation. No significant canal or foraminal  stenosis.    C4-C5: Disc bulge. Superimposed small T2 hyperintense central disc  protrusion. Uncinate hypertrophy. The protrusion contacts and mildly  flattens the ventral spinal cord contributing to overall mild  central canal stenosis. Mild relative left neural foraminal  narrowing.    C5-C6: Shallow disc bulge with bilateral disc osteophyte  ridge/uncinate hypertrophy. No significant spinal canal stenosis.  Mild left neural foraminal narrowing.    C6-C7: Shallow disc bulge with uncinate hypertrophy. No significant  spinal canal or neural foraminal narrowing.    C7-T1: Mild facet/ligamentum flavum hypertrophy on the left. No disc  herniation. No significant canal or foraminal stenosis.    IMPRESSION:  Cervical spondylosis as outlined and most notably as follows.    At C4-C5, disc bulge with superimposed small central disc  protrusion. Uncinate hypertrophy. The disc protrusion contacts and  mildly flattens the ventral spinal cord contributing to overall mild  spinal canal stenosis. Mild left neural foraminal narrowing.    At C5-C6, disc bulge with osteophyte  ridge and uncinate hypertrophy  contribute to mild left neural foraminal narrowing.      Electronically Signed  By: Kellie Simmering DO  On: 02/26/2019 09:23 -- MRI THORACIC SPINE WITHOUT CONTRAST  TECHNIQUE: Multiplanar, multisequence MR imaging of the thoracic spine was performed. No intravenous contrast was administered.  COMPARISON:  Concurrently performed MRI of the cervical spine, thoracic spine radiographs 11/23/2018  FINDINGS:   At T4-T5, there is a small disc bulge with superimposed small right center/foraminal disc protrusion. The disc protrusion partially effaces the right ventral thecal sac and likely contacts the ventral spinal cord. Only mild relative central canal narrowing. The disc protrusion contributes to mild right neural foraminal narrowing.  At T5-T6, tiny right foraminal disc protrusion. No significant spinal canal or neural foraminal narrowing.  At T6-T7, minimal disc bulge. No significant spinal canal or neural foraminal narrowing.  At T9-T10, broad-based left center/foraminal disc protrusion. The protrusion mildly effaces the left ventral  thecal sac without significant central canal stenosis. Mild left neural foraminal narrowing.  IMPRESSION: Thoracic spondylosis as outlined and most notably as follows.  At T4-T5, small disc bulge with superimposed small right center/foraminal disc protrusion. The disc protrusion partially effaces the right ventral thecal sac and likely contacts the ventral spinal cord. Only mild relative central canal narrowing. Mild right neural foraminal narrowing.  At T9-T10, broad-based left center/foraminal disc protrusion. Mild left neural foraminal narrowing. The disc protrusion also mildly effaces the left ventral thecal sac without significant central canal stenosis.   Electronically Signed   By: Kellie Simmering DO   On: 02/26/2019 09:39   She reports that she has quit smoking. Her smoking use  included cigarettes. She has a 0.10 pack-year smoking history. She has never used smokeless tobacco.  Recent Labs    05/04/19 1443  HGBA1C 6.0*    Objective:  VS:  HT:5\' 5"  (165.1 cm)   WT:222 lb (100.7 kg)  BMI:36.94    BP:120/85  HR:75bpm  TEMP: ( )  RESP:  Physical Exam Vitals and nursing note reviewed.  Constitutional:      General: She is not in acute distress.    Appearance: Normal appearance. She is well-developed. She is obese. She is not ill-appearing.  HENT:     Head: Normocephalic and atraumatic.  Eyes:     Conjunctiva/sclera: Conjunctivae normal.     Pupils: Pupils are equal, round, and reactive to light.  Cardiovascular:     Rate and Rhythm: Normal rate.     Pulses: Normal pulses.  Pulmonary:     Effort: Pulmonary effort is normal.  Musculoskeletal:        General: Tenderness present. Normal range of motion.     Cervical back: Normal range of motion. Tenderness present. No rigidity.     Right lower leg: No edema.     Left lower leg: No edema.     Comments: Patient has some difficulty going from sit to full extension and she does have concordant low back pain with extension and facet loading.  She does have myofascial trigger points and tender points.  She has pain over the greater trochanter and PSIS more on the left than right.  No pain with hip rotation good distal strength.  Skin:    General: Skin is warm and dry.     Findings: No erythema or rash.  Neurological:     General: No focal deficit present.     Mental Status: She is alert and oriented to person, place, and time.     Sensory: No sensory deficit.     Motor: No abnormal muscle tone.     Coordination: Coordination normal.     Gait: Gait normal.  Psychiatric:        Mood and Affect: Mood normal.        Behavior: Behavior normal.     Ortho Exam Imaging: AP and lateral lumbar spine x-ray: AP and lumbar spine x-ray reveal increased lumbar lordosis with no scoliosis and there appears to be  probably a partially sacralized L5 segment or transitional segment.  She has facet arthropathy at L4-5 and L5-S1 with by foraminal narrowing.  No listhesis.  Past Medical/Family/Surgical/Social History: Medications & Allergies reviewed per EMR, new medications updated. Patient Active Problem List   Diagnosis Date Noted  . Prediabetes 05/05/2019  . Elevated cholesterol 05/05/2019  . Vitamin D deficiency 05/05/2019  . Obesity (BMI 30.0-34.9) 08/18/2013  . Follow-up examination, following unspecified surgery 11/16/2012  .  Depression 08/14/2012   Past Medical History:  Diagnosis Date  . Anemia   . Anxiety   . Back pain   . Constipation   . Depression   . Fibromyalgia   . History of blood transfusion 06/2002   [facility] - unsure of number of units  . Hyperlipidemia    borderline - diet controlled  . Mental disorder    Currently depressed  . Panic attack   . PONV (postoperative nausea and vomiting)    Family History  Problem Relation Age of Onset  . Diabetes Mother   . High blood pressure Mother   . Stroke Mother   . Heart disease Mother   . Anxiety disorder Mother   . Heart disease Other   . Hypertension Other    Past Surgical History:  Procedure Laterality Date  . BILATERAL SALPINGECTOMY N/A 09/25/2012   Procedure: BILATERAL SALPINGECTOMY;  Surgeon: Lahoma Crocker, MD;  Location: [facility];  Service: Gynecology;  Laterality: N/A;  . CESAREAN SECTION  2002, 2004, 2006   x 3  . ROBOTIC ASSISTED TOTAL HYSTERECTOMY N/A 09/25/2012   Procedure: ROBOTIC ASSISTED TOTAL HYSTERECTOMY;  Surgeon: Lahoma Crocker, MD;  Location: [facility];  Service: Gynecology;  Laterality: N/A;  . TUBAL LIGATION     Social History   Occupational History  . Occupation: out of work for the last year  Tobacco Use  . Smoking status: Former Smoker    Packs/day: 0.20    Years: 0.50    Pack years: 0.10    Types: Cigarettes  . Smokeless tobacco: Never Used  . Tobacco comment: trying to quit  Substance  and Sexual Activity  . Alcohol use: No  . Drug use: No  . Sexual activity: Yes    Birth control/protection: Surgical

## 2019-04-27 NOTE — Progress Notes (Signed)
Pt states pain in the lower back and radiates into the left buttocks. Pt states pain started years ago and has gotten worse in the past few months. Standing and slightly bending makes pain worse. Heating pad helps with pain.   .Numeric Pain Rating Scale and Functional Assessment Average Pain 9 Pain Right Now 6 My pain is intermittent, constant, dull and aching Pain is worse with: walking and bending Pain improves with: heat/ice   In the last MONTH (on 0-10 scale) has pain interfered with the following?  1. General activity like being  able to carry out your everyday physical activities such as walking, climbing stairs, carrying groceries, or moving a chair?  Rating(8)  2. Relation with others like being able to carry out your usual social activities and roles such as  activities at home, at work and in your community. Rating(8)  3. Enjoyment of life such that you have  been bothered by emotional problems such as feeling anxious, depressed or irritable?  Rating(4)

## 2019-05-03 ENCOUNTER — Other Ambulatory Visit: Payer: Self-pay

## 2019-05-03 ENCOUNTER — Encounter (INDEPENDENT_AMBULATORY_CARE_PROVIDER_SITE_OTHER): Payer: Self-pay | Admitting: Bariatrics

## 2019-05-03 ENCOUNTER — Ambulatory Visit (INDEPENDENT_AMBULATORY_CARE_PROVIDER_SITE_OTHER): Payer: Medicaid Other | Admitting: Bariatrics

## 2019-05-03 VITALS — BP 110/72 | HR 86 | Temp 98.5°F | Ht 64.0 in | Wt 223.0 lb

## 2019-05-03 DIAGNOSIS — Z6838 Body mass index (BMI) 38.0-38.9, adult: Secondary | ICD-10-CM

## 2019-05-03 DIAGNOSIS — Z1331 Encounter for screening for depression: Secondary | ICD-10-CM | POA: Diagnosis not present

## 2019-05-03 DIAGNOSIS — R7309 Other abnormal glucose: Secondary | ICD-10-CM | POA: Diagnosis not present

## 2019-05-03 DIAGNOSIS — F3289 Other specified depressive episodes: Secondary | ICD-10-CM

## 2019-05-03 DIAGNOSIS — Z0289 Encounter for other administrative examinations: Secondary | ICD-10-CM

## 2019-05-03 DIAGNOSIS — M549 Dorsalgia, unspecified: Secondary | ICD-10-CM | POA: Diagnosis not present

## 2019-05-03 DIAGNOSIS — R5383 Other fatigue: Secondary | ICD-10-CM | POA: Diagnosis not present

## 2019-05-03 DIAGNOSIS — G8929 Other chronic pain: Secondary | ICD-10-CM

## 2019-05-03 DIAGNOSIS — R0602 Shortness of breath: Secondary | ICD-10-CM | POA: Diagnosis not present

## 2019-05-03 DIAGNOSIS — E559 Vitamin D deficiency, unspecified: Secondary | ICD-10-CM

## 2019-05-04 ENCOUNTER — Encounter (INDEPENDENT_AMBULATORY_CARE_PROVIDER_SITE_OTHER): Payer: Self-pay | Admitting: Bariatrics

## 2019-05-04 NOTE — Progress Notes (Signed)
Dear Dr. Lahoma Crocker,   Thank you for referring Caroline Carlson to our clinic. The following note includes my evaluation and treatment recommendations.  Chief Complaint:   [place] (MR# [id number]) is a 39 y.o. female who presents for evaluation and treatment of obesity and related comorbidities. Current BMI is Body mass index is 38.28 kg/m.Marland Kitchen Caroline Carlson has been struggling with her weight for many years and has been unsuccessful in either losing weight, maintaining weight loss, or reaching her healthy weight goal.  Caroline Carlson is currently in the action stage of change and ready to dedicate time achieving and maintaining a healthier weight. Caroline Carlson is interested in becoming our patient and working on intensive lifestyle modifications including (but not limited to) diet and exercise for weight loss.  Caroline Carlson eats out 2-3 times a week. She eats sweets at night.  Caroline Carlson's habits were reviewed today and are as follows: Her family eats meals together, she thinks her family will eat healthier with her, her desired weight loss is 63 lbs, she started gaining weight after having children, her heaviest weight ever was 230 pounds, she craves sweets at night, she snacks frequently in the evenings, she skips breakfast frequently, she has binge eating behaviors and she struggles with emotional eating.  Depression Screen Caroline Carlson's Food and Mood (modified PHQ-9) score was 10.  Depression screen PHQ 2/9 05/03/2019  Decreased Interest 3  Down, Depressed, Hopeless 3  PHQ - 2 Score 6  Altered sleeping 1  Tired, decreased energy 1  Change in appetite 2  Feeling bad or failure about yourself  0  Trouble concentrating 0  Moving slowly or fidgety/restless 0  Suicidal thoughts 0  PHQ-9 Score 10  Difficult doing work/chores Extremely dIfficult   Subjective:   Other fatigue. Caroline Carlson denies daytime somnolence and admits to waking up still tired. Patent has a  history of symptoms of morning headache. Caroline Carlson generally gets 4 or 5 hours of sleep per night, and states that shedoes not sleep well most nights. Snoring is present. Apneic episodes are not present. Epworth Sleepiness Score is 7.  SOB (shortness of breath) on exertion. Caroline Carlson notes increasing shortness of breath with certain activities and seems to be worsening over time with weight gain. She notes getting out of breath sooner with activity than she used to. This has gotten worse recently. Caroline Carlson denies shortness of breath at rest or orthopnea.  Elevated glucose. Glucose level 100 on 11/21/2018. Caroline Carlson reports intermittent increased hunger.  Chronic back pain, unspecified back location, unspecified back pain laterality. Caroline Carlson is taking Aleve and Tylenol. Back pain is chronic.  Vitamin D deficiency. Caroline Carlson is not taking Vitamin D.  Other depression, with emotional eating. Caroline Carlson is struggling with emotional eating and using food for comfort to the extent that it is negatively impacting her health. She has been working on behavior modification techniques to help reduce her emotional eating and has been somewhat successful. She shows no sign of suicidal or homicidal ideations. Caroline Carlson reports stress/emotional eating.  Depression screening. Caroline Carlson had a moderately positive depression screen with a PHQ-9 score of 10.  Assessment/Plan:   Other fatigue. Caroline Carlson does feel that her weight is causing her energy to be lower than it should be. Fatigue may be related to obesity, depression or many other causes. Labs will be ordered, and in the meanwhile, Caroline Carlson will focus on self care including making healthy food choices, increasing physical activity and focusing on stress reduction. EKG 12-Lead, T3, T4, free, TSH tests  ordered today.  SOB (shortness of breath) on exertion. Caroline Carlson does feel that she gets out of breath more easily that she used to when she  exercises. Caroline Carlson's shortness of breath appears to be obesity related and exercise induced. She has agreed to work on weight loss and gradually increase exercise to treat her exercise induced shortness of breath. Will continue to monitor closely. T3, T4, free, TSH labs ordered today.  Elevated glucose.  Hemoglobin A1c, Insulin, random, Lipid Panel With LDL/HDL Ratio labs ordered today.  Chronic back pain, unspecified back location, unspecified back pain laterality. Caroline Carlson will continue Aleve and Tylenol as needed.  Vitamin D deficiency. Low Vitamin D level contributes to fatigue and are associated with obesity, breast, and colon cancer. VITAMIN D 25 Hydroxy (Vit-D Deficiency, Fractures) level ordered today.  Other depression, with emotional eating. Behavior modification techniques were discussed today to help Caroline Carlson deal with her emotional/non-hunger eating behaviors.  Orders and follow up as documented in patient record. We discussed CBT techniques with the patient.  Depression screening. Caroline Carlson had a positive depression screening. Depression is commonly associated with obesity and often results in emotional eating behaviors. We will monitor this closely and work on CBT to help improve the non-hunger eating patterns. Referral to Psychology may be required if no improvement is seen as she continues in our clinic.  Class 2 severe obesity with serious comorbidity and body mass index (BMI) of 38.0 to 38.9 in adult, unspecified obesity type ([place]).  Caroline Carlson is currently in the action stage of change and her goal is to continue with weight loss efforts. I recommend Caroline Carlson begin the structured treatment plan as follows:  She has agreed to the Category 2 Plan.  She will work on meal planning, decreasing nighttime eating, and decreasing sweets.  We independently reviewed with the patient labs from 11/21/2018 including CMP, CBC, and glucose.   Exercise goals: All adults should  avoid inactivity. Some physical activity is better than none, and adults who participate in any amount of physical activity gain some health benefits.   Behavioral modification strategies: increasing lean protein intake, decreasing simple carbohydrates, increasing vegetables, increasing water intake, decreasing eating out, no skipping meals, meal planning and cooking strategies, keeping healthy foods in the home, ways to avoid boredom eating, ways to avoid night time snacking, better snacking choices and planning for success.  She was informed of the importance of frequent follow-up visits to maximize her success with intensive lifestyle modifications for her multiple health conditions. She was informed we would discuss her lab results at her next visit unless there is a critical issue that needs to be addressed sooner. Caroline Carlson agreed to keep her next visit at the agreed upon time to discuss these results.  Objective:   Blood pressure 110/72, pulse 86, temperature 98.5 F (36.9 C), height 5\' 4"  (1.626 m), weight 223 lb (101.2 kg), last menstrual period 09/11/2012, SpO2 98 %. Body mass index is 38.28 kg/m.  EKG: Sinus  Rhythm with a rate of 84 BPM. Low voltage in precordial leads which is likely due to body habitus. Otherwise normal.  Indirect Calorimeter completed today shows a VO2 of 244 and a REE of 1699.  Her calculated basal metabolic rate is [id number] thus her basal metabolic rate is worse than expected.  General: Cooperative, alert, well developed, in no acute distress. HEENT: Conjunctivae and lids unremarkable. Cardiovascular: Regular rhythm.  Lungs: Normal work of breathing. Neurologic: No focal deficits.   Lab Results  Component Value Date  CREATININE 0.81 11/21/2018   BUN 14 11/21/2018   NA 139 11/21/2018   K 3.9 11/21/2018   CL 109 11/21/2018   CO2 22 11/21/2018   Lab Results  Component Value Date   ALT 13 10/20/2013   AST 15 10/20/2013   ALKPHOS 71 10/20/2013   BILITOT  0.4 10/20/2013   Lab Results  Component Value Date   HGBA1C 6.0 (H) 10/20/2013   No results found for: INSULIN Lab Results  Component Value Date   TSH 1.251 08/14/2012   Lab Results  Component Value Date   CHOL 203 (H) 08/14/2012   HDL 44 08/14/2012   LDLCALC 143 (H) 08/14/2012   TRIG 82 08/14/2012   CHOLHDL 4.6 08/14/2012   Lab Results  Component Value Date   WBC 10.6 (H) 11/21/2018   HGB 13.5 11/21/2018   HCT 41.2 11/21/2018   MCV 92.2 11/21/2018   PLT 356 11/21/2018   No results found for: IRON, TIBC, FERRITIN  Attestation Statements:   Reviewed by clinician on day of visit: allergies, medications, problem list, medical history, surgical history, family history, social history, and previous encounter notes.  Time spent on visit including pre-visit chart review and post-visit charting and care was 45 minutes.   Migdalia Dk, am acting as Location manager for [employer], DO   I have reviewed the above documentation for accuracy and completeness, and I agree with the above. Jearld Lesch, DO

## 2019-05-05 ENCOUNTER — Encounter (INDEPENDENT_AMBULATORY_CARE_PROVIDER_SITE_OTHER): Payer: Self-pay | Admitting: Bariatrics

## 2019-05-05 DIAGNOSIS — R7303 Prediabetes: Secondary | ICD-10-CM | POA: Insufficient documentation

## 2019-05-05 DIAGNOSIS — E559 Vitamin D deficiency, unspecified: Secondary | ICD-10-CM | POA: Insufficient documentation

## 2019-05-05 DIAGNOSIS — E7800 Pure hypercholesterolemia, unspecified: Secondary | ICD-10-CM | POA: Insufficient documentation

## 2019-05-05 LAB — LIPID PANEL WITH LDL/HDL RATIO
Cholesterol, Total: 232 mg/dL — ABNORMAL HIGH (ref 100–199)
HDL: 57 mg/dL (ref >39)
LDL Chol Calc (NIH): 157 mg/dL — ABNORMAL HIGH (ref 0–99)
LDL/HDL Ratio: 2.8 ratio (ref 0.0–3.2)
Triglycerides: 103 mg/dL (ref 0–149)
VLDL Cholesterol Cal: 18 mg/dL (ref 5–40)

## 2019-05-05 LAB — VITAMIN D 25 HYDROXY (VIT D DEFICIENCY, FRACTURES): Vit D, 25-Hydroxy: 10.3 ng/mL — ABNORMAL LOW (ref 30.0–100.0)

## 2019-05-05 LAB — T3: T3, Total: 112 ng/dL (ref 71–180)

## 2019-05-05 LAB — T4, FREE: Free T4: 1.05 ng/dL (ref 0.82–1.77)

## 2019-05-05 LAB — HEMOGLOBIN A1C
Est. average glucose Bld gHb Est-mCnc: 126 mg/dL
Hgb A1c MFr Bld: 6 % — ABNORMAL HIGH (ref 4.8–5.6)

## 2019-05-05 LAB — INSULIN, RANDOM: INSULIN: 25.3 u[IU]/mL — ABNORMAL HIGH (ref 2.6–24.9)

## 2019-05-05 LAB — TSH: TSH: 0.908 u[IU]/mL (ref 0.450–4.500)

## 2019-05-12 ENCOUNTER — Encounter (INDEPENDENT_AMBULATORY_CARE_PROVIDER_SITE_OTHER): Payer: Self-pay | Admitting: Bariatrics

## 2019-05-12 NOTE — Telephone Encounter (Signed)
Please advise 

## 2019-05-17 ENCOUNTER — Other Ambulatory Visit: Payer: Self-pay

## 2019-05-17 ENCOUNTER — Encounter (INDEPENDENT_AMBULATORY_CARE_PROVIDER_SITE_OTHER): Payer: Self-pay | Admitting: Bariatrics

## 2019-05-17 ENCOUNTER — Ambulatory Visit (INDEPENDENT_AMBULATORY_CARE_PROVIDER_SITE_OTHER): Payer: Medicaid Other | Admitting: Bariatrics

## 2019-05-17 VITALS — BP 100/69 | HR 82 | Temp 97.9°F | Ht 65.0 in | Wt 225.0 lb

## 2019-05-17 DIAGNOSIS — E7800 Pure hypercholesterolemia, unspecified: Secondary | ICD-10-CM | POA: Diagnosis not present

## 2019-05-17 DIAGNOSIS — Z9189 Other specified personal risk factors, not elsewhere classified: Secondary | ICD-10-CM

## 2019-05-17 DIAGNOSIS — E559 Vitamin D deficiency, unspecified: Secondary | ICD-10-CM

## 2019-05-17 DIAGNOSIS — F3289 Other specified depressive episodes: Secondary | ICD-10-CM

## 2019-05-17 DIAGNOSIS — Z6838 Body mass index (BMI) 38.0-38.9, adult: Secondary | ICD-10-CM

## 2019-05-17 DIAGNOSIS — R7303 Prediabetes: Secondary | ICD-10-CM

## 2019-05-18 ENCOUNTER — Encounter (INDEPENDENT_AMBULATORY_CARE_PROVIDER_SITE_OTHER): Payer: Self-pay | Admitting: Bariatrics

## 2019-05-18 MED ORDER — VITAMIN D (ERGOCALCIFEROL) 1.25 MG (50000 UNIT) PO CAPS: 50000.0000 [IU] | ORAL_CAPSULE | ORAL | 0 refills | Status: DC

## 2019-05-18 MED ORDER — BUPROPION HCL ER (SR) 150 MG PO TB12: 150.0000 mg | ORAL_TABLET | Freq: Every day | ORAL | 0 refills | Status: DC

## 2019-05-18 NOTE — Progress Notes (Signed)
Chief Complaint:   OBESITY Caroline Carlson is here to discuss her progress with her obesity treatment plan along with follow-up of her obesity related diagnoses. Caroline Carlson is on the Category 2 Plan and states she is following her eating plan approximately 80% of the time. Caroline Carlson states she is walking 30-45 minutes 5 times per week.  Today's visit was #: 2 Starting weight: 223 lbs Starting date: 05/03/2019 Today's weight: 225 lbs Today's date: 05/17/2019 Total lbs lost to date: 0 Total lbs lost since last in-office visit: 0  Interim History: Caroline Carlson is up 2 lbs. She is up over 2 lbs of water per the bioimpedance scale.  Subjective:   Vitamin D deficiency. Caroline Carlson is not taking Vitamin D supplementation. Last Vitamin D 10.3 on 05/04/2019.  Pre-diabetes. Caroline Carlson has a diagnosis of prediabetes based on her elevated HgA1c and was informed this puts her at greater risk of developing diabetes. She continues to work on diet and exercise to decrease her risk of diabetes. She denies nausea; reports one episode of hypoglycemia.  Lab Results  Component Value Date   HGBA1C 6.0 (H) 05/04/2019   Lab Results  Component Value Date   INSULIN 25.3 (H) 05/04/2019   Elevated cholesterol. Caroline Carlson is on no medications. Total cholesterol 232 on 05/04/2019 with LDL of 157.  Other depression with emotional eating. Caroline Carlson is struggling with emotional eating and using food for comfort to the extent that it is negatively impacting her health. She has been working on behavior modification techniques to help reduce her emotional eating and has been somewhat successful. She shows no sign of suicidal or homicidal ideations. Caroline Carlson reports emotional and increased nighttime eating.  At risk for hypoglycemia. Caroline Carlson is at increased risk for hypoglycemia due to changes in diet, diagnosis of diabetes, and/or insulin use.  Assessment/Plan:   Vitamin D deficiency. Low Vitamin D  level contributes to fatigue and are associated with obesity, breast, and colon cancer. She was given a prescription for Vitamin D, Ergocalciferol, (DRISDOL) 1.25 MG (50000 UNIT) CAPS capsule every week #4 with 0 refills and will follow-up for routine testing of Vitamin D, at least 2-3 times per year to avoid over-replacement.    Pre-diabetes. Caroline Carlson will continue to work on weight loss, exercise, and decreasing simple carbohydrates to help decrease the risk of diabetes. We discussed Prediabetes and Insulin Resistance and handout was given. She was advised on eating regular meals.  Elevated cholesterol. Caroline Carlson will decrease saturated fats and increase PUFA's and MUFA's.  Other depression with emotional eating. Behavior modification techniques were discussed today to help Caroline Carlson deal with her emotional/non-hunger eating behaviors.  Orders and follow up as documented in patient record. Prescription was given for buPROPion (WELLBUTRIN SR) 150 MG 12 hr tablet 1 PO daily #30 with 0 refills.  At risk for hypoglycemia. Caroline Carlson was given approximately 15 minutes of counseling today regarding prevention of hypoglycemia. She was advised of symptoms of hypoglycemia. Caroline Carlson was instructed to avoid skipping meals, eat regular protein rich meals and schedule low calorie snacks as needed.   Repetitive spaced learning was employed today to elicit superior memory formation and behavioral change.  Class 2 severe obesity with serious comorbidity and body mass index (BMI) of 38.0 to 38.9 in adult, unspecified obesity type ([place]).  Caroline Carlson is currently in the action stage of change. As such, her goal is to continue with weight loss efforts. She has agreed to the Category 2 Plan.   She will work on meal planning.  We independently reviewed with the patient labs from 05/04/2019 including lipids, Vitamin D, A1c, insulin, and thyroid panel.  Handout was given on Hypoglycemia.  Exercise goals: All  adults should avoid inactivity. Some physical activity is better than none, and adults who participate in any amount of physical activity gain some health benefits.  Behavioral modification strategies: increasing lean protein intake, decreasing simple carbohydrates, increasing vegetables, increasing water intake, decreasing eating out, no skipping meals, meal planning and cooking strategies, keeping healthy foods in the home and planning for success.  Caroline Carlson has agreed to follow-up with our clinic in 2 weeks. She was informed of the importance of frequent follow-up visits to maximize her success with intensive lifestyle modifications for her multiple health conditions.   Objective:   Blood pressure 100/69, pulse 82, temperature 97.9 F (36.6 C), height 5\' 5"  (1.651 m), weight 225 lb (102.1 kg), last menstrual period 09/11/2012, SpO2 99 %. Body mass index is 37.44 kg/m.  General: Cooperative, alert, well developed, in no acute distress. HEENT: Conjunctivae and lids unremarkable. Cardiovascular: Regular rhythm.  Lungs: Normal work of breathing. Neurologic: No focal deficits.   Lab Results  Component Value Date   CREATININE 0.81 11/21/2018   BUN 14 11/21/2018   NA 139 11/21/2018   K 3.9 11/21/2018   CL 109 11/21/2018   CO2 22 11/21/2018   Lab Results  Component Value Date   ALT 13 10/20/2013   AST 15 10/20/2013   ALKPHOS 71 10/20/2013   BILITOT 0.4 10/20/2013   Lab Results  Component Value Date   HGBA1C 6.0 (H) 05/04/2019   HGBA1C 6.0 (H) 10/20/2013   Lab Results  Component Value Date   INSULIN 25.3 (H) 05/04/2019   Lab Results  Component Value Date   TSH 0.908 05/04/2019   Lab Results  Component Value Date   CHOL 232 (H) 05/04/2019   HDL 57 05/04/2019   LDLCALC 157 (H) 05/04/2019   TRIG 103 05/04/2019   CHOLHDL 4.6 08/14/2012   Lab Results  Component Value Date   WBC 10.6 (H) 11/21/2018   HGB 13.5 11/21/2018   HCT 41.2 11/21/2018   MCV 92.2 11/21/2018    PLT 356 11/21/2018   No results found for: IRON, TIBC, FERRITIN  Attestation Statements:   Reviewed by clinician on day of visit: allergies, medications, problem list, medical history, surgical history, family history, social history, and previous encounter notes.  Migdalia Dk, am acting as Location manager for [employer], DO   I have reviewed the above documentation for accuracy and completeness, and I agree with the above. Jearld Lesch, DO

## 2019-05-28 ENCOUNTER — Telehealth: Payer: Self-pay | Admitting: *Deleted

## 2019-05-28 NOTE — Telephone Encounter (Signed)
Per [employer] no pa is needed for [id number] in an outpatient setting.

## 2019-06-01 ENCOUNTER — Encounter (INDEPENDENT_AMBULATORY_CARE_PROVIDER_SITE_OTHER): Payer: Self-pay | Admitting: Bariatrics

## 2019-06-01 ENCOUNTER — Other Ambulatory Visit: Payer: Self-pay

## 2019-06-01 ENCOUNTER — Ambulatory Visit (INDEPENDENT_AMBULATORY_CARE_PROVIDER_SITE_OTHER): Payer: Medicaid Other | Admitting: Bariatrics

## 2019-06-01 VITALS — BP 106/76 | HR 81 | Temp 98.5°F | Ht 65.0 in | Wt 227.0 lb

## 2019-06-01 DIAGNOSIS — Z6837 Body mass index (BMI) 37.0-37.9, adult: Secondary | ICD-10-CM

## 2019-06-01 DIAGNOSIS — M545 Low back pain, unspecified: Secondary | ICD-10-CM

## 2019-06-01 DIAGNOSIS — F3289 Other specified depressive episodes: Secondary | ICD-10-CM

## 2019-06-01 NOTE — Progress Notes (Signed)
Chief Complaint:   OBESITY Caroline Carlson is here to discuss her progress with her obesity treatment plan along with follow-up of her obesity related diagnoses. Caroline Carlson is on the Category 2 Plan and states she is following her eating plan approximately 0% of the time. Caroline Carlson states she is exercising 0 minutes 0 times per week.  Today's visit was #: 3 Starting weight: 223 lbs Starting date: 05/03/2019 Today's weight: 227 lbs Today's date: 06/01/2019 Total lbs lost to date: 0 Total lbs lost since last in-office visit: 0  Interim History: Caroline Carlson is up 2 lbs. Per the bioimpedance scale, her body water is up almost 2 lbs.  Subjective:   Other depression with emotional eating. Caroline Carlson is struggling with emotional eating and using food for comfort to the extent that it is negatively impacting her health. She has been working on behavior modification techniques to help reduce her emotional eating and has been somewhat successful. She shows no sign of suicidal or homicidal ideations. Caroline Carlson has been prescribed Wellbutrin, but did not take it as she was concurrently prescribed Cymbalta and Robaxin per her PCP.  Low back pain, unspecified back pain laterality, unspecified chronicity, unspecified whether sciatica present. Caroline Carlson is taking Cymbalta and Robaxin per her PCP.  Assessment/Plan:   Other depression with emotional eating. Behavior modification techniques were discussed today to help Caroline Carlson deal with her emotional/non-hunger eating behaviors.  Orders and follow up as documented in patient record. Caroline Carlson will not begin Wellbutrin.  Low back pain, unspecified back pain laterality, unspecified chronicity, unspecified whether sciatica present. Caroline Carlson will continue her medications as directed. She will alternate heat and ice for pain.  Class 2 severe obesity with serious comorbidity and body mass index (BMI) of 37.0 to 37.9 in adult, unspecified obesity  type (Caroline Carlson).  Caroline Carlson is currently in the action stage of change. As such, her goal is to continue with weight loss efforts. She has agreed to the Category 2 Plan.   She will work on meal planning, intentional eating, increasing her water intake, and being more adherent to the plan.  Exercise goals: Caroline Carlson will start walking for exercise.  Behavioral modification strategies: increasing lean protein intake, decreasing simple carbohydrates, increasing vegetables, increasing water intake, decreasing eating out, no skipping meals, meal planning and cooking strategies, keeping healthy foods in the home and planning for success.  Caroline Carlson has agreed to follow-up with our clinic in 2 weeks. She was informed of the importance of frequent follow-up visits to maximize her success with intensive lifestyle modifications for her multiple health conditions.   Objective:   Blood pressure 106/76, pulse 81, temperature 98.5 F (36.9 C), height 5\' 5"  (1.651 m), weight 227 lb (103 kg), last menstrual period 09/11/2012, SpO2 99 %. Body mass index is 37.77 kg/m.  General: Cooperative, alert, well developed, in no acute distress. HEENT: Conjunctivae and lids unremarkable. Cardiovascular: Regular rhythm.  Lungs: Normal work of breathing. Neurologic: No focal deficits.   Lab Results  Component Value Date   CREATININE 0.81 11/21/2018   BUN 14 11/21/2018   NA 139 11/21/2018   K 3.9 11/21/2018   CL 109 11/21/2018   CO2 22 11/21/2018   Lab Results  Component Value Date   ALT 13 10/20/2013   AST 15 10/20/2013   ALKPHOS 71 10/20/2013   BILITOT 0.4 10/20/2013   Lab Results  Component Value Date   HGBA1C 6.0 (H) 05/04/2019   HGBA1C 6.0 (H) 10/20/2013   Lab Results  Component Value Date  INSULIN 25.3 (H) 05/04/2019   Lab Results  Component Value Date   TSH 0.908 05/04/2019   Lab Results  Component Value Date   CHOL 232 (H) 05/04/2019   HDL 57 05/04/2019   LDLCALC 157 (H) 05/04/2019     TRIG 103 05/04/2019   CHOLHDL 4.6 08/14/2012   Lab Results  Component Value Date   WBC 10.6 (H) 11/21/2018   HGB 13.5 11/21/2018   HCT 41.2 11/21/2018   MCV 92.2 11/21/2018   PLT 356 11/21/2018   No results found for: IRON, TIBC, FERRITIN  Attestation Statements:   Reviewed by clinician on day of visit: allergies, medications, problem list, medical history, surgical history, family history, social history, and previous encounter notes.  Time spent on visit including pre-visit chart review and post-visit charting and care was 20 minutes.   Migdalia Dk, am acting as Location manager for [employer], DO   I have reviewed the above documentation for accuracy and completeness, and I agree with the above. Jearld Lesch, DO

## 2019-06-02 ENCOUNTER — Encounter (INDEPENDENT_AMBULATORY_CARE_PROVIDER_SITE_OTHER): Payer: Self-pay | Admitting: Bariatrics

## 2019-06-17 ENCOUNTER — Telehealth: Payer: Self-pay

## 2019-06-17 ENCOUNTER — Telehealth: Payer: Self-pay | Admitting: Physical Medicine and Rehabilitation

## 2019-06-17 ENCOUNTER — Other Ambulatory Visit: Payer: Self-pay

## 2019-06-17 ENCOUNTER — Ambulatory Visit (INDEPENDENT_AMBULATORY_CARE_PROVIDER_SITE_OTHER): Payer: Medicaid Other | Admitting: Bariatrics

## 2019-06-17 ENCOUNTER — Ambulatory Visit (INDEPENDENT_AMBULATORY_CARE_PROVIDER_SITE_OTHER): Payer: Medicaid Other | Admitting: Orthopedic Surgery

## 2019-06-17 ENCOUNTER — Encounter (INDEPENDENT_AMBULATORY_CARE_PROVIDER_SITE_OTHER): Payer: Self-pay | Admitting: Bariatrics

## 2019-06-17 ENCOUNTER — Telehealth: Payer: Self-pay | Admitting: *Deleted

## 2019-06-17 VITALS — BP 105/70 | HR 84 | Temp 98.1°F | Ht 65.0 in | Wt 223.0 lb

## 2019-06-17 DIAGNOSIS — E559 Vitamin D deficiency, unspecified: Secondary | ICD-10-CM

## 2019-06-17 DIAGNOSIS — R7303 Prediabetes: Secondary | ICD-10-CM | POA: Diagnosis not present

## 2019-06-17 DIAGNOSIS — Z6837 Body mass index (BMI) 37.0-37.9, adult: Secondary | ICD-10-CM | POA: Diagnosis not present

## 2019-06-17 DIAGNOSIS — E66812 Obesity, class 2: Secondary | ICD-10-CM

## 2019-06-17 DIAGNOSIS — M542 Cervicalgia: Secondary | ICD-10-CM

## 2019-06-17 MED ORDER — VITAMIN D (ERGOCALCIFEROL) 1.25 MG (50000 UNIT) PO CAPS: 50000.0000 [IU] | ORAL_CAPSULE | ORAL | 0 refills | Status: DC

## 2019-06-17 MED ORDER — METHOCARBAMOL 500 MG PO TABS: ORAL_TABLET | ORAL | 0 refills | Status: DC

## 2019-06-17 MED ORDER — METFORMIN HCL 500 MG PO TABS: 500.0000 mg | ORAL_TABLET | Freq: Every day | ORAL | 0 refills | Status: DC

## 2019-06-17 NOTE — Telephone Encounter (Signed)
Patient seen by Dr Marlou Sa today. Wants to refer to Dr Lorin Mercy for surgical csp eval. Can you please get them scheduled?

## 2019-06-17 NOTE — Telephone Encounter (Signed)
Needs the PT that Dr. Marlou Sa ordered and looks like she will seen by Dr. Lorin Mercy, she needs that appt as well. I can do one more if last helped

## 2019-06-17 NOTE — Telephone Encounter (Signed)
Patient was seen by Dr Marlou Sa today for her neck. She is under the impression that she has an appointment with Dr Ernestina Patches on 06/10.  I did not see it in the system. Can either of you please reach out to patient to verify/schedule. Thanks.

## 2019-06-17 NOTE — Telephone Encounter (Signed)
Patient seen by Dr Marlou Sa today. Wanted patient to have Tylenol#3 1 po q 8hrs prn pain #30 tab with no refills. Can you please submit?

## 2019-06-18 ENCOUNTER — Other Ambulatory Visit: Payer: Self-pay | Admitting: Surgical

## 2019-06-18 ENCOUNTER — Encounter: Payer: Self-pay | Admitting: Orthopedic Surgery

## 2019-06-18 MED ORDER — ACETAMINOPHEN-CODEINE #3 300-30 MG PO TABS: 1.0000 | ORAL_TABLET | Freq: Three times a day (TID) | ORAL | 0 refills | Status: DC | PRN

## 2019-06-18 NOTE — Telephone Encounter (Signed)
submitted

## 2019-06-18 NOTE — Progress Notes (Signed)
Office Visit Note   Patient: Caroline Carlson           Date of Birth: [birth year]           MRN: [id number] Visit Date: 06/17/2019 Requested by: No referring provider defined for this encounter. PCP: Patient, No Pcp Per  Subjective: Chief Complaint  Patient presents with  . Neck - Pain    HPI: Patient presents for evaluation of her cervical spine.  She has a lot of sharp neck pains.  She has had previous treatment with Dr. Ernestina Patches for this problem.  Reports cervical and upper thoracic pain.  Shots have helped her neck in the past.  The pain is nonradiating of into the right side of her scalp and head.  The pain comes and goes.  She does have days where she has pain all day versus some days where she just has pain in the morning.  She has been taking oxycodone from someone else.  She is currently not working.  She also takes Aleve.  She has an appointment with Dr. Ernestina Patches for injections in her neck on January 10.  Her MRI scan is reviewed and she does have multiple levels of spinal stenosis in the cervical spine.              ROS: All systems reviewed are negative as they relate to the chief complaint within the history of present illness.  Patient denies  fevers or chills.   Assessment & Plan: Visit Diagnoses:  1. Neck pain     Plan: Impression is neck pain which is becoming refractory to nonoperative management.  Did write her a one-time prescription for Tylenol 3.  Also Robaxin.  Referred to Dr. Lorin Mercy for surgical consideration and evaluation.  Keep appointment with Dr. Ernestina Patches on June 10.  Physical therapy for cervical traction 1-2 times a week for 6 weeks.  Follow-up with me as needed.  Follow-Up Instructions: No follow-ups on file.   Orders:  Orders Placed This Encounter  Procedures  . Ambulatory referral to Physical Therapy   Meds ordered this encounter  Medications  . methocarbamol (ROBAXIN) 500 MG tablet    Sig: 1 po q 8hrs prn    Dispense:  30 tablet    Refill:  0       Procedures: No procedures performed   Clinical Data: No additional findings.  Objective: Vital Signs: LMP 09/11/2012   Physical Exam:   Constitutional: Patient appears well-developed HEENT:  Head: Normocephalic Eyes:EOM are normal Neck: Normal range of motion Cardiovascular: Normal rate Pulmonary/chest: Effort normal Neurologic: Patient is alert Skin: Skin is warm Psychiatric: Patient has normal mood and affect    Ortho Exam: Ortho exam demonstrates full active and passive range of motion of the neck.  She has 5 out of 5 grip EPL FPL interosseous wrist flexion extension bicep triceps and deltoid strength.  Radial pulse intact.  No muscle atrophy in either arm.  No definite paresthesias C5-T1.  No other masses lymphadenopathy or skin changes noted in the neck region.  Shoulder examination normal bilaterally with good rotator cuff strength and no restriction of external rotation of 15 degrees of abduction.  Specialty Comments:  No specialty comments available.  Imaging: No results found.   PMFS History: Patient Active Problem List   Diagnosis Date Noted  . Prediabetes 05/05/2019  . Elevated cholesterol 05/05/2019  . Vitamin D deficiency 05/05/2019  . Obesity (BMI 30.0-34.9) 08/18/2013  . Follow-up examination, following unspecified surgery 11/16/2012  .  Depression 08/14/2012   Past Medical History:  Diagnosis Date  . Anemia   . Anxiety   . Back pain   . Constipation   . Depression   . Fibromyalgia   . History of blood transfusion 06/2002   [facility] - unsure of number of units  . Hyperlipidemia    borderline - diet controlled  . Mental disorder    Currently depressed  . Panic attack   . PONV (postoperative nausea and vomiting)     Family History  Problem Relation Age of Onset  . Diabetes Mother   . High blood pressure Mother   . Stroke Mother   . Heart disease Mother   . Anxiety disorder Mother   . Heart disease Other   . Hypertension Other      Past Surgical History:  Procedure Laterality Date  . BILATERAL SALPINGECTOMY N/A 09/25/2012   Procedure: BILATERAL SALPINGECTOMY;  Surgeon: Lahoma Crocker, MD;  Location: [facility];  Service: Gynecology;  Laterality: N/A;  . CESAREAN SECTION  2002, 2004, 2006   x 3  . ROBOTIC ASSISTED TOTAL HYSTERECTOMY N/A 09/25/2012   Procedure: ROBOTIC ASSISTED TOTAL HYSTERECTOMY;  Surgeon: Lahoma Crocker, MD;  Location: [facility];  Service: Gynecology;  Laterality: N/A;  . TUBAL LIGATION     Social History   Occupational History  . Occupation: out of work for the last year  Tobacco Use  . Smoking status: Former Smoker    Packs/day: 0.20    Years: 0.50    Pack years: 0.10    Types: Cigarettes  . Smokeless tobacco: Never Used  . Tobacco comment: trying to quit  Substance and Sexual Activity  . Alcohol use: No  . Drug use: No  . Sexual activity: Yes    Birth control/protection: Surgical

## 2019-06-18 NOTE — Telephone Encounter (Signed)
Pt is scheduled no BT with driver 2/48/18

## 2019-06-21 NOTE — Progress Notes (Signed)
Chief Complaint:   OBESITY Caroline Carlson is here to discuss her progress with her obesity treatment plan along with follow-up of her obesity related diagnoses. [name] is on the Category 2 Plan and states she is following her eating plan approximately 50% of the time. [name] states she is walking 3 miles 3-4 times per week.  Today's visit was #: 4 Starting weight: 223 lbs Starting date: 05/03/2019 Today's weight: 223 lbs Today's date: 06/17/2019 Total lbs lost to date: 0 Total lbs lost since last in-office visit: 4  Interim History: [name] is down 4 lbs. She reports doing better with her water and protein intake. She states she skips breakfast at times.  Subjective:   Vitamin D deficiency. [name] is taking Vitamin D supplementation. Last Vitamin D 10.3 on 05/04/2019.  Pre-diabetes. [name] has a diagnosis of prediabetes based on her elevated HgA1c and was informed this puts her at greater risk of developing diabetes. She continues to work on diet and exercise to decrease her risk of diabetes. She denies nausea or hypoglycemia. Gladies is on no medication.  Lab Results  Component Value Date   HGBA1C 6.0 (H) 05/04/2019   Lab Results  Component Value Date   INSULIN 25.3 (H) 05/04/2019   Assessment/Plan:   Vitamin D deficiency. Low Vitamin D level contributes to fatigue and are associated with obesity, breast, and colon cancer. She was given a prescription for Vitamin D, Ergocalciferol, (DRISDOL) 1.25 MG (50000 UNIT) CAPS capsule every week #4 with 0 refills and will follow-up for routine testing of Vitamin D, at least 2-3 times per year to avoid over-replacement.   Pre-diabetes. [name] will continue to work on weight loss, exercise, increasing healthy fats and protein, and decreasing simple carbohydrates to help decrease the risk of diabetes. Prescription was given for metFORMIN (GLUCOPHAGE) 500 MG tablet 1 PO daily with lunch #30 with 0  refills.  Class 2 severe obesity with serious comorbidity and body mass index (BMI) of 37.0 to 37.9 in adult, unspecified obesity type ([place]).  [name] is currently in the action stage of change. As such, her goal is to continue with weight loss efforts. She has agreed to the Category 2 Plan.   She will work on meal planning and intentional eating.   Exercise goals: [name] will continue walking for exercise.  Behavioral modification strategies: increasing lean protein intake, decreasing simple carbohydrates, increasing vegetables, increasing water intake, decreasing eating out, no skipping meals, meal planning and cooking strategies, keeping healthy foods in the home and planning for success.  [name] has agreed to follow-up with our clinic in 2-3 weeks. She was informed of the importance of frequent follow-up visits to maximize her success with intensive lifestyle modifications for her multiple health conditions.   Objective:   Blood pressure 105/70, pulse 84, temperature 98.1 F (36.7 C), height 5\' 5"  (1.651 m), weight 223 lb (101.2 kg), last menstrual period 09/11/2012, SpO2 99 %. Body mass index is 37.11 kg/m.  General: Cooperative, alert, well developed, in no acute distress. HEENT: Conjunctivae and lids unremarkable. Cardiovascular: Regular rhythm.  Lungs: Normal work of breathing. Neurologic: No focal deficits.   Lab Results  Component Value Date   CREATININE 0.81 11/21/2018   BUN 14 11/21/2018   NA 139 11/21/2018   K 3.9 11/21/2018   CL 109 11/21/2018   CO2 22 11/21/2018   Lab Results  Component Value Date   ALT 13 10/20/2013   AST 15 10/20/2013   ALKPHOS 71 10/20/2013  BILITOT 0.4 10/20/2013   Lab Results  Component Value Date   HGBA1C 6.0 (H) 05/04/2019   HGBA1C 6.0 (H) 10/20/2013   Lab Results  Component Value Date   INSULIN 25.3 (H) 05/04/2019   Lab Results  Component Value Date   TSH 0.908 05/04/2019   Lab Results  Component Value Date    CHOL 232 (H) 05/04/2019   HDL 57 05/04/2019   LDLCALC 157 (H) 05/04/2019   TRIG 103 05/04/2019   CHOLHDL 4.6 08/14/2012   Lab Results  Component Value Date   WBC 10.6 (H) 11/21/2018   HGB 13.5 11/21/2018   HCT 41.2 11/21/2018   MCV 92.2 11/21/2018   PLT 356 11/21/2018   No results found for: IRON, TIBC, FERRITIN  Attestation Statements:   Reviewed by clinician on day of visit: allergies, medications, problem list, medical history, surgical history, family history, social history, and previous encounter notes.  Migdalia Dk, am acting as Location manager for [employer], DO   I have reviewed the above documentation for accuracy and completeness, and I agree with the above. Jearld Lesch, DO

## 2019-06-21 NOTE — Telephone Encounter (Signed)
error 

## 2019-06-23 ENCOUNTER — Ambulatory Visit: Payer: Medicaid Other | Attending: Orthopedic Surgery | Admitting: Physical Therapy

## 2019-06-23 ENCOUNTER — Other Ambulatory Visit: Payer: Self-pay

## 2019-06-23 DIAGNOSIS — M6281 Muscle weakness (generalized): Secondary | ICD-10-CM | POA: Insufficient documentation

## 2019-06-23 DIAGNOSIS — M542 Cervicalgia: Secondary | ICD-10-CM | POA: Insufficient documentation

## 2019-06-23 DIAGNOSIS — R293 Abnormal posture: Secondary | ICD-10-CM | POA: Diagnosis present

## 2019-06-23 NOTE — Therapy (Signed)
[place], [place], [address] Phone: [phone]   Fax:  [phone]  Physical Therapy Treatment  Patient Details  Name: Caroline Carlson MRN: [id number] Date of Birth: 15-Jun-1980 Referring Provider (PT): Marlou Sa Tonna Corner MD   Encounter Date: 06/23/2019  PT End of Session - 06/23/19 1257    Visit Number  1    Number of Visits  16    Date for PT Re-Evaluation  08/18/19    Authorization Type  MCD   1st authorization 06-23-19 for MCD through 07-14-19    PT Start Time  1015    PT Stop Time  1100    PT Time Calculation (min)  45 min    Activity Tolerance  Patient limited by pain    Behavior During Therapy  [facility] for tasks assessed/performed       Past Medical History:  Diagnosis Date  . Anemia   . Anxiety   . Back pain   . Constipation   . Depression   . Fibromyalgia   . History of blood transfusion 06/2002   [facility] - unsure of number of units  . Hyperlipidemia    borderline - diet controlled  . Mental disorder    Currently depressed  . Panic attack   . PONV (postoperative nausea and vomiting)     Past Surgical History:  Procedure Laterality Date  . BILATERAL SALPINGECTOMY N/A 09/25/2012   Procedure: BILATERAL SALPINGECTOMY;  Surgeon: Lahoma Crocker, MD;  Location: [facility];  Service: Gynecology;  Laterality: N/A;  . CESAREAN SECTION  2002, 2004, 2006   x 3  . ROBOTIC ASSISTED TOTAL HYSTERECTOMY N/A 09/25/2012   Procedure: ROBOTIC ASSISTED TOTAL HYSTERECTOMY;  Surgeon: Lahoma Crocker, MD;  Location: [facility];  Service: Gynecology;  Laterality: N/A;  . TUBAL LIGATION      There were no vitals filed for this visit.  Subjective Assessment - 06/23/19 1022    Subjective  I tried to make an emergency appt on May 13 because I was in so much pain. I try to make headache medicine. I used to have headaches every day. I try to take a good routine.   I have had neck injections. I thought it was good but it  did not last. my neck pain is causing my to have RT side neck and head pain. Neck pain really started about 2 years ago I am being referred to a neck surgeon Dr Lorin Mercy Dr Ernestina Patches for injections    Pertinent History  depression, obesity, prediabetes and Hyperlipidemia    Limitations  Sitting;Standing;Walking;House hold activities    How long can you sit comfortably?  unlimited    How long can you stand comfortably?  15 min    How long can you walk comfortably?  I try to work through it trying to walk 2-3 miles because I need to exericise    Diagnostic tests  x ray MRI    Patient Stated Goals  I want to be able to care for kids and exercise without having to deal with constant pain    Currently in Pain?  Yes    Pain Score  3    at worst 10/10   Pain Location  Neck   also headaches   Pain Orientation  Right;Left    Pain Descriptors / Indicators  Sharp;Aching;Shooting    Pain Type  Chronic pain    Pain Radiating Towards  sometimes radiates to lateral arm and up my head  Pain Onset  More than a month ago    Pain Frequency  Constant    Aggravating Factors   cannot sleep in bed due to neck pain. and back pain driving         [facility] PT Assessment - 06/23/19 0001      Assessment   Medical Diagnosis  Neck pain and headaches    Referring Provider (PT)  Meredith Pel MD    Onset Date/Surgical Date  06/22/17   neck and head pain began   Hand Dominance  Right    Next MD Visit  not scheduled    Prior Therapy  none      Precautions   Precautions  None      Restrictions   Weight Bearing Restrictions  No      Balance Screen   Has the patient fallen in the past 6 months  No    Has the patient had a decrease in activity level because of a fear of falling?   No    Is the patient reluctant to leave their home because of a fear of falling?   No      Home Environment   Living Environment  Private residence    Living Arrangements  Spouse/significant other;Children    Type of [place] Access  Level entry    Home Layout  Two level      Prior Function   Level of Independence  Independent    Vocation  Unemployed    Leisure  I like to remodel homes      Cognition   Overall Cognitive Status  Within Functional Limits for tasks assessed      Observation/Other Assessments   Focus on Therapeutic Outcomes (FOTO)   MCD NA      Functional Tests   Functional tests  Sit to Stand      Sit to Stand   Comments  5 x sts 10.45 sec      Posture/Postural Control   Posture/Postural Control  Postural limitations    Postural Limitations  Rounded Shoulders;Forward head;Increased lumbar lordosis    Posture Comments  increased abdominal girth      ROM / Strength   AROM / PROM / Strength  AROM;Strength      AROM   Overall AROM   Deficits    Right Shoulder Flexion  160 Degrees    Right Shoulder ABduction  155 Degrees    Left Shoulder Flexion  170 Degrees    Left Shoulder ABduction  164 Degrees    Cervical Flexion  20   ERP   Cervical Extension  40    Cervical - Right Side Bend  25    Cervical - Left Side Bend  25    Cervical - Right Rotation  60    Cervical - Left Rotation  55      Strength   Overall Strength  Deficits    Right Shoulder Flexion  4+/5    Right Shoulder Extension  5/5    Right Shoulder ABduction  4/5    Right Shoulder Internal Rotation  4+/5    Right Shoulder External Rotation  4/5    Left Shoulder Flexion  4+/5    Left Shoulder Extension  5/5    Left Shoulder ABduction  4/5    Left Shoulder Internal Rotation  4+/5    Left Shoulder External Rotation  4-/5    Right Hand Grip (lbs)  55lb  55, 55,55   Left Hand Grip (lbs)  44.3 lb   41 42 50     Palpation   Palpation comment  Pt with elevated upper traps and very tight cervcial paraspinals , marked tenderness over [place]ager hump                    [facility] Adult PT Treatment/Exercise - 06/23/19 0001      Modalities   Modalities  Moist Heat      Moist Heat Therapy   Number  Minutes Moist Heat  15 Minutes    Moist Heat Location  Cervical      Neck Exercises: Stretches   Upper Trapezius Stretch  Right;Left;2 reps;30 seconds    Upper Trapezius Stretch Limitations  VC adn TC    Levator Stretch  Right;Left;2 reps;30 seconds    Levator Stretch Limitations  VC adn TC    Other Neck Stretches  neck retraction 10 x    Other Neck Stretches  shoulder rolls x 10       Trigger Point Dry Needling - 06/23/19 0001    Consent Given?  Yes    Education Handout Provided  Yes    Muscles Treated Head and Neck  Upper trapezius;Levator scapulae   bl   Dry Needling Comments  75 mm .30 gage    Upper Trapezius Response  Twitch reponse elicited;Palpable increased muscle length    Levator Scapulae Response  Twitch response elicited;Palpable increased muscle length           PT Education - 06/23/19 1050    Education Details  POC Explanation of findings  initial HeP and TPDN education    Person(s) Educated  Patient    Methods  Explanation;Demonstration;Tactile cues;Verbal cues;Handout    Comprehension  Verbalized understanding;Returned demonstration       PT Short Term Goals - 06/23/19 1044      PT SHORT TERM GOAL #1   Title  Pt will be independent with intial HEP    Baseline  no knowledge    Time  3    Period  Weeks    Status  New    Target Date  07/14/19      PT SHORT TERM GOAL #2   Title  Pt will gain at least 10 degrees of cervical flexion in order to sleep more comfortably    Baseline  Pt neck flexion limited to 20 degrees    Time  3    Period  Weeks    Status  New    Target Date  07/14/19      PT SHORT TERM GOAL #3   Title  Pt will be able to sleep in bed in order to get more restful and restorative slelep    Baseline  Pt must sleep upright in recliner due to neck pain    Time  3    Period  Weeks    Status  New    Target Date  07/14/19        PT Long Term Goals - 06/23/19 1252      PT LONG TERM GOAL #1   Title  Demonstrate and verbalize  techniques to reduce the risk of re-injury including: lifting, posture, body mechanics.    Baseline  no knowlege    Time  8    Period  Weeks    Status  New    Target Date  08/18/19      PT LONG TERM GOAL #2  Title  Pt will be able to sleep for at least 4 hours with restorative sleep pin bed with pain 2/10 or less    Baseline  unable to sleep in bed , only reclinier    Time  8    Period  Weeks    Status  New    Target Date  08/18/19      PT LONG TERM GOAL #3   Title  Pt will tolerate sitting 1 hour without increased pain to ride in car without increased pain.    Baseline  Pt has pain up to 10/10 when neck pain flares    Time  8    Period  Weeks    Status  New    Target Date  08/18/19      PT LONG TERM GOAL #4   Title  Pt will be able to perform household chores with at least 50% decreased pain when having headache or onset of muscle flaring.    Baseline  Pt unable to perform any housework when in headache of muscle flares    Time  8    Period  Weeks    Status  New    Target Date  08/18/19      PT LONG TERM GOAL #5   Title  Pt will be able to return to some form of exericise  without exacerbating neck pain    Baseline  Pt pain up to 10/10    Time  8    Period  Weeks    Status  New    Target Date  08/18/19            Plan - 06/23/19 1241    Clinical Impression Statement  Pt is 39 yo female with at least 2 year history of neck pain that has been increasing. with headaches and increasing neck pain  MRI with Cervical spondylosis and some disc bulging. Pt c/o of increased neck pain with radiating pain into   RT shoulder. Marland Kitchen Pt presents with signs and symptoms compatible with cervical radiculopathy  of cervical spine. Pt would benefit from skilled PT for 2 times a week for 8 weeks to address above impariments and functional limitations and return to pain-free PLOF.    Personal Factors and Comorbidities  Comorbidity 1    Comorbidities  depression, obesity, prediabetes and  Hyperlipidemia    Examination-Activity Limitations  Sleep;Lift    Examination-Participation Restrictions  Laundry;Cleaning    Stability/Clinical Decision Making  Stable/Uncomplicated    Clinical Decision Making  Low    Rehab Potential  Good    PT Frequency  2x / week   MCD 1st authorization 3 visits in 3 weeks time frame   PT Duration  8 weeks    PT Treatment/Interventions  ADLs/Self Care Home Management;Cryotherapy;Electrical Stimulation;Iontophoresis 4mg /ml Dexamethasone;Moist Heat;Traction;Ultrasound;Therapeutic exercise;Therapeutic activities;Functional mobility training;Neuromuscular re-education;Patient/family education;Passive range of motion;Manual techniques;Dry needling;Taping;Spinal Manipulations;Joint Manipulations    PT Next Visit Plan  check TPDN benefit  progress deep neck flexors MCD    PT Home Exercise Plan  levator, upper trap scapular rolls, neck retraction    Consulted and Agree with Plan of Care  Patient       Patient will benefit from skilled therapeutic intervention in order to improve the following deficits and impairments:  Obesity, Pain, Postural dysfunction, Improper body mechanics, Increased muscle spasms, Decreased range of motion, Decreased strength, Impaired UE functional use  Visit Diagnosis: Cervicalgia  Abnormal posture  Muscle weakness (generalized)     Problem  List Patient Active Problem List   Diagnosis Date Noted  . Prediabetes 05/05/2019  . Elevated cholesterol 05/05/2019  . Vitamin D deficiency 05/05/2019  . Obesity (BMI 30.0-34.9) 08/18/2013  . Follow-up examination, following unspecified surgery 11/16/2012  . Depression 08/14/2012    Voncille Lo, PT Certified Exercise Expert for the Aging Adult  06/23/19 12:59 PM Phone: [phone] Fax: [place] [facility] [address] [place], [place], [address] Phone: [phone]   Fax:  [phone]  Name: Caroline Carlson MRN: [id number] Date of Birth: 1981-01-10

## 2019-06-23 NOTE — Patient Instructions (Signed)
Trigger Point Dry Needling  . What is Trigger Point Dry Needling (DN)? o DN is a physical therapy technique used to treat muscle pain and dysfunction. Specifically, DN helps deactivate muscle trigger points (muscle knots).  o A thin filiform needle is used to penetrate the skin and stimulate the underlying trigger point. The goal is for a local twitch response (LTR) to occur and for the trigger point to relax. No medication of any kind is injected during the procedure.   . What Does Trigger Point Dry Needling Feel Like?  o The procedure feels different for each individual patient. Some patients report that they do not actually feel the needle enter the skin and overall the process is not painful. Very mild bleeding may occur. However, many patients feel a deep cramping in the muscle in which the needle was inserted. This is the local twitch response.   Marland Kitchen How Will I feel after the treatment? o Soreness is normal, and the onset of soreness may not occur for a few hours. Typically this soreness does not last longer than two days.  o Bruising is uncommon, however; ice can be used to decrease any possible bruising.  o In rare cases feeling tired or nauseous after the treatment is normal. In addition, your symptoms may get worse before they get better, this period will typically not last longer than 24 hours.   . What Can I do After My Treatment? o Increase your hydration by drinking more water for the next 24 hours. o You may place ice or heat on the areas treated that have become sore, however, do not use heat on inflamed or bruised areas. Heat often brings more relief post needling. o You can continue your regular activities, but vigorous activity is not recommended initially after the treatment for 24 hours. o DN is best combined with other physical therapy such as strengthening, stretching, and other therapies.   Levator Stretch   Grasp seat or sit on hand on side to be stretched. Turn head  toward other side and look down. Use hand on head to gently stretch neck in that position. Hold _30___ seconds. Repeat on other side. Repeat _3___ times. Do __3__ sessions per day.  http://gt2.exer.us/30   Copyright  VHI. All rights reserved.  Side-Bending   One hand on opposite side of head, pull head to side as far as is comfortable. Stop if there is pain. Hold _30___ seconds. Repeat with other hand to other side. Repeat __3__ times. Do _3___ sessions per day.    Copyright  VHI. All rights reserved.  Chin Protraction / Retraction   Slide head forward keeping chin level. Slide head back, pulling chin in. Hold each position _10__ seconds. Repeat _5_ times. Do _3__ sessions per day.  Copyright  VHI. All rights reserved.   Voncille Lo, PT Certified Exercise Expert for the Aging Adult  06/23/19 10:50 AM Phone: [phone] Fax: [phone]

## 2019-06-28 ENCOUNTER — Encounter (INDEPENDENT_AMBULATORY_CARE_PROVIDER_SITE_OTHER): Payer: Self-pay | Admitting: Bariatrics

## 2019-06-28 ENCOUNTER — Ambulatory Visit (INDEPENDENT_AMBULATORY_CARE_PROVIDER_SITE_OTHER): Payer: Medicaid Other | Admitting: Physical Medicine and Rehabilitation

## 2019-06-28 ENCOUNTER — Ambulatory Visit: Payer: Medicaid Other | Admitting: Physical Therapy

## 2019-06-28 ENCOUNTER — Ambulatory Visit: Payer: Self-pay

## 2019-06-28 ENCOUNTER — Other Ambulatory Visit: Payer: Self-pay

## 2019-06-28 ENCOUNTER — Encounter: Payer: Self-pay | Admitting: Physical Medicine and Rehabilitation

## 2019-06-28 VITALS — BP 129/80 | HR 72

## 2019-06-28 DIAGNOSIS — M5414 Radiculopathy, thoracic region: Secondary | ICD-10-CM

## 2019-06-28 MED ORDER — METHYLPREDNISOLONE ACETATE 80 MG/ML IJ SUSP: 40.0000 mg | Freq: Once | INTRAMUSCULAR | Status: DC

## 2019-06-28 NOTE — Progress Notes (Signed)
Pt states pain in the neck and middle back. Pt states last injection 04/07/19 helped out some and pain returned a month ago. Pt states sleeping makes pain worse. Nothing helps with pain. Pt states PT session helps with pain.  .Numeric Pain Rating Scale and Functional Assessment Average Pain 3   In the last MONTH (on 0-10 scale) has pain interfered with the following?  1. General activity like being  able to carry out your everyday physical activities such as walking, climbing stairs, carrying groceries, or moving a chair?  Rating(4)   -Driver(pt states she will call her husband), -BT, -Dye Allergies.

## 2019-06-29 NOTE — Progress Notes (Signed)
Caroline Carlson - 39 y.o. female MRN [id number]  Date of birth: 08/23/1980  Office Visit Note: Visit Date: 06/28/2019 PCP: Patient, No Pcp Per Referred by: No ref. provider found  Subjective: Chief Complaint  Patient presents with  . Neck - Pain  . Middle Back - Pain   HPI:  [name] is a 39 y.o. female who comes in today For planned repeat T2-3 interlaminar epidural steroid injection.  Prior injection gave her some relief but it was very short-lived.  She has thoracic MRI showing small disc protrusions noncompressive.  Cervical imaging has been similar.  She has recently been in physical therapy and received dry needling that she said actually helped greatly and almost felt like her shoulders and upper back were released.  I had a long discussion with her today about maximizing the dry needling.  Unfortunately she is on Medicaid and currently have so many of those sessions.  We did come to the conclusion today to go ahead and repeat the epidural injection.  I discussed with her at great length though that if the injections that we perform are just not helping very long is probably not a good answer.  Her case is complicated by obesity in terms of doing the injection itself.  Dr. Marlou Sa who follows her from an orthopedic standpoint had referred her to Dr. Rodell Perna for evaluation of vascular to follow-up with that evaluation.  ROS Otherwise per HPI.  Assessment & Plan: Visit Diagnoses:  1. Thoracic radiculopathy     Plan: No additional findings.   Meds & Orders:  Meds ordered this encounter  Medications  . methylPREDNISolone acetate (DEPO-MEDROL) injection 40 mg    Orders Placed This Encounter  Procedures  . XR C-ARM NO REPORT  . Epidural Steroid injection    Follow-up: Return for visit to requesting physician as needed.   Procedures: No procedures performed  Thoracic epidural Steroid Injection - Interlaminar Approach with Fluoroscopic Guidance  Patient:  Caroline Carlson      Date of Birth: May 21, 1980 MRN: [id number] PCP: Patient, No Pcp Per      Visit Date: 06/28/2019   Universal Protocol:    Date/Time: 06/15/215:58 AM  Consent Given By: the patient  Position: PRONE  Additional Comments: Vital signs were monitored before and after the procedure. Patient was prepped and draped in the usual sterile fashion. The correct patient, procedure, and site was verified.   Injection Procedure Details:  Procedure Site One Meds Administered:  Meds ordered this encounter  Medications  . methylPREDNISolone acetate (DEPO-MEDROL) injection 40 mg     Laterality: Right  Location/Site: T2-3  Needle size: 20 G, 4 in  Needle type: Touhy  Needle Placement: Paramedian epidural space  Findings:  -Comments: Excellent flow of contrast into the epidural space.  Procedure Details: Using a paramedian approach from the side mentioned above, the region overlying the inferior lamina was localized under fluoroscopic visualization and the soft tissues overlying this structure were infiltrated with 4 ml. of 1% Lidocaine without Epinephrine. A # 20 gauge, Tuohy needle was inserted into the epidural space using a paramedian approach.  The epidural space was localized using loss of resistance along with lateral and contralateral oblique bi-planar fluoroscopic views.  After negative aspirate for air, blood, and CSF, a 2 ml. volume of Isovue-250 was injected into the epidural space and the flow of contrast was observed. Radiographs were obtained for documentation purposes.   The injectate was administered into the level noted above.  Additional Comments:  The patient tolerated the procedure well Dressing: 2 x 2 sterile gauze and Band-Aid    Post-procedure details: Patient was observed during the procedure. Post-procedure instructions were reviewed.  Patient left the clinic in stable condition.     Clinical History: MRI CERVICAL SPINE WITHOUT  CONTRAST    TECHNIQUE:  Multiplanar, multisequence MR imaging of the cervical spine was  performed. No intravenous contrast was administered.    COMPARISON: Cervical spine radiographs 02/04/2019    FINDINGS:  Alignment: Reversal of the expected cervical lordosis. No  significant spondylolisthesis.    Vertebrae: Vertebral body height is maintained. No marrow edema or  suspicious osseous lesion.    Cord: No spinal cord signal abnormality.    Posterior Fossa, vertebral arteries, paraspinal tissues: No signal  abnormality identified within included portions of the posterior  fossa. Flow voids preserved within the cervical vertebral arteries.  Paraspinal soft tissues within normal limits.    Disc levels:    Moderate C5-C6 and C6-C7 disc degeneration. Mild disc degeneration  at the remaining levels.    C2-C3: No disc herniation. No significant canal or foraminal  stenosis.    C3-C4: No disc herniation. No significant canal or foraminal  stenosis.    C4-C5: Disc bulge. Superimposed small T2 hyperintense central disc  protrusion. Uncinate hypertrophy. The protrusion contacts and mildly  flattens the ventral spinal cord contributing to overall mild  central canal stenosis. Mild relative left neural foraminal  narrowing.    C5-C6: Shallow disc bulge with bilateral disc osteophyte  ridge/uncinate hypertrophy. No significant spinal canal stenosis.  Mild left neural foraminal narrowing.    C6-C7: Shallow disc bulge with uncinate hypertrophy. No significant  spinal canal or neural foraminal narrowing.    C7-T1: Mild facet/ligamentum flavum hypertrophy on the left. No disc  herniation. No significant canal or foraminal stenosis.    IMPRESSION:  Cervical spondylosis as outlined and most notably as follows.    At C4-C5, disc bulge with superimposed small central disc  protrusion. Uncinate hypertrophy. The disc protrusion contacts and  mildly flattens the ventral  spinal cord contributing to overall mild  spinal canal stenosis. Mild left neural foraminal narrowing.    At C5-C6, disc bulge with osteophyte ridge and uncinate hypertrophy  contribute to mild left neural foraminal narrowing.      Electronically Signed  By: Kellie Simmering DO  On: 02/26/2019 09:23 -- MRI THORACIC SPINE WITHOUT CONTRAST  TECHNIQUE: Multiplanar, multisequence MR imaging of the thoracic spine was performed. No intravenous contrast was administered.  COMPARISON:  Concurrently performed MRI of the cervical spine, thoracic spine radiographs 11/23/2018  FINDINGS:   At T4-T5, there is a small disc bulge with superimposed small right center/foraminal disc protrusion. The disc protrusion partially effaces the right ventral thecal sac and likely contacts the ventral spinal cord. Only mild relative central canal narrowing. The disc protrusion contributes to mild right neural foraminal narrowing.  At T5-T6, tiny right foraminal disc protrusion. No significant spinal canal or neural foraminal narrowing.  At T6-T7, minimal disc bulge. No significant spinal canal or neural foraminal narrowing.  At T9-T10, broad-based left center/foraminal disc protrusion. The protrusion mildly effaces the left ventral thecal sac without significant central canal stenosis. Mild left neural foraminal narrowing.  IMPRESSION: Thoracic spondylosis as outlined and most notably as follows.  At T4-T5, small disc bulge with superimposed small right center/foraminal disc protrusion. The disc protrusion partially effaces the right ventral thecal sac and likely contacts the ventral spinal cord. Only mild relative  central canal narrowing. Mild right neural foraminal narrowing.  At T9-T10, broad-based left center/foraminal disc protrusion. Mild left neural foraminal narrowing. The disc protrusion also mildly effaces the left ventral thecal sac without significant central canal  stenosis.   Electronically Signed   By: Kellie Simmering DO   On: 02/26/2019 09:39     Objective:  VS:  HT:    WT:   BMI:     BP:129/80  HR:72bpm  TEMP: ( )  RESP:  Physical Exam Vitals and nursing note reviewed.  Constitutional:      General: She is not in acute distress.    Appearance: Normal appearance. She is not ill-appearing.  HENT:     Head: Normocephalic and atraumatic.     Right Ear: External ear normal.     Left Ear: External ear normal.  Eyes:     Extraocular Movements: Extraocular movements intact.  Cardiovascular:     Rate and Rhythm: Normal rate.     Pulses: Normal pulses.  Musculoskeletal:     Cervical back: Tenderness present. No rigidity.     Right lower leg: No edema.     Left lower leg: No edema.     Comments: Patient has good strength in the upper extremities including 5 out of 5 strength in wrist extension long finger flexion and APB.  There is no atrophy of the hands intrinsically.  There is a negative Hoffmann's test.  Positive trigger points noted once again in the levator scapula and trapezius bilaterally as well as paraspinal musculature of the upper cervical spine.  Lymphadenopathy:     Cervical: No cervical adenopathy.  Skin:    Findings: No erythema, lesion or rash.  Neurological:     General: No focal deficit present.     Mental Status: She is alert and oriented to person, place, and time.     Sensory: No sensory deficit.     Motor: No weakness or abnormal muscle tone.     Coordination: Coordination normal.  Psychiatric:        Mood and Affect: Mood normal.        Behavior: Behavior normal.      Imaging: XR C-ARM NO REPORT  Result Date: 06/28/2019 Please see Notes tab for imaging impression.

## 2019-06-29 NOTE — Procedures (Signed)
Thoracic epidural Steroid Injection - Interlaminar Approach with Fluoroscopic Guidance  Patient: Caroline Carlson      Date of Birth: 1980/07/24 MRN: [id number] PCP: Patient, No Pcp Per      Visit Date: 06/28/2019   Universal Protocol:    Date/Time: 06/15/215:58 AM  Consent Given By: the patient  Position: PRONE  Additional Comments: Vital signs were monitored before and after the procedure. Patient was prepped and draped in the usual sterile fashion. The correct patient, procedure, and site was verified.   Injection Procedure Details:  Procedure Site One Meds Administered:  Meds ordered this encounter  Medications  . methylPREDNISolone acetate (DEPO-MEDROL) injection 40 mg     Laterality: Right  Location/Site: T2-3  Needle size: 20 G, 4 in  Needle type: Touhy  Needle Placement: Paramedian epidural space  Findings:  -Comments: Excellent flow of contrast into the epidural space.  Procedure Details: Using a paramedian approach from the side mentioned above, the region overlying the inferior lamina was localized under fluoroscopic visualization and the soft tissues overlying this structure were infiltrated with 4 ml. of 1% Lidocaine without Epinephrine. A # 20 gauge, Tuohy needle was inserted into the epidural space using a paramedian approach.  The epidural space was localized using loss of resistance along with lateral and contralateral oblique bi-planar fluoroscopic views.  After negative aspirate for air, blood, and CSF, a 2 ml. volume of Isovue-250 was injected into the epidural space and the flow of contrast was observed. Radiographs were obtained for documentation purposes.   The injectate was administered into the level noted above.  Additional Comments:  The patient tolerated the procedure well Dressing: 2 x 2 sterile gauze and Band-Aid    Post-procedure details: Patient was observed during the procedure. Post-procedure instructions were  reviewed.  Patient left the clinic in stable condition.

## 2019-06-30 ENCOUNTER — Other Ambulatory Visit: Payer: Self-pay

## 2019-06-30 ENCOUNTER — Ambulatory Visit: Payer: Medicaid Other | Admitting: Physical Therapy

## 2019-06-30 ENCOUNTER — Encounter: Payer: Self-pay | Admitting: Physical Therapy

## 2019-06-30 DIAGNOSIS — R293 Abnormal posture: Secondary | ICD-10-CM

## 2019-06-30 DIAGNOSIS — M6281 Muscle weakness (generalized): Secondary | ICD-10-CM

## 2019-06-30 DIAGNOSIS — M542 Cervicalgia: Secondary | ICD-10-CM | POA: Diagnosis not present

## 2019-06-30 NOTE — Therapy (Signed)
[place], [place], [address] Phone: [phone]   Fax:  [phone]  Physical Therapy Treatment  Patient Details  Name: Caroline Carlson MRN: [id number] Date of Birth: 02/02/1980 Referring Provider (PT): Meredith Pel MD   Encounter Date: 06/30/2019   PT End of Session - 06/30/19 0946    Visit Number 2    Number of Visits 16    Date for PT Re-Evaluation 08/18/19    Authorization Type MCD resubmit at 4th visit.    Authorization Time Period 06/25/2019 - 07/15/2019    Authorization - Visit Number 1    Authorization - Number of Visits 3    PT Start Time 1829   pt arrived 17 min late   PT Stop Time 1015    PT Time Calculation (min) 28 min    Activity Tolerance Patient limited by pain    Behavior During Therapy [facility] for tasks assessed/performed           Past Medical History:  Diagnosis Date  . Anemia   . Anxiety   . Back pain   . Constipation   . Depression   . Fibromyalgia   . History of blood transfusion 06/2002   [facility] - unsure of number of units  . Hyperlipidemia    borderline - diet controlled  . Mental disorder    Currently depressed  . Panic attack   . PONV (postoperative nausea and vomiting)     Past Surgical History:  Procedure Laterality Date  . BILATERAL SALPINGECTOMY N/A 09/25/2012   Procedure: BILATERAL SALPINGECTOMY;  Surgeon: Lahoma Crocker, MD;  Location: [facility];  Service: Gynecology;  Laterality: N/A;  . CESAREAN SECTION  2002, 2004, 2006   x 3  . ROBOTIC ASSISTED TOTAL HYSTERECTOMY N/A 09/25/2012   Procedure: ROBOTIC ASSISTED TOTAL HYSTERECTOMY;  Surgeon: Lahoma Crocker, MD;  Location: [facility];  Service: Gynecology;  Laterality: N/A;  . TUBAL LIGATION      There were no vitals filed for this visit.   Subjective Assessment - 06/30/19 0949    Subjective " I liked the DN last session which made me feel. After the that session I went and got a shot which helped  after a few days"    Patient Stated Goals I want to be able to care for kids and exercise without having to deal with constant pain    Currently in Pain? Yes    Pain Score 3     Pain Location Neck    Pain Orientation Right;Left    Pain Descriptors / Indicators Aching    Pain Type Chronic pain    Pain Onset More than a month ago    Pain Frequency Intermittent              [facility] PT Assessment - 06/30/19 0001      Assessment   Medical Diagnosis Neck pain and headaches    Referring Provider (PT) Meredith Pel MD                         [facility] Adult PT Treatment/Exercise - 06/30/19 0001      Self-Care   Self-Care Other Self-Care Comments;Posture    Posture efficient posture and positioning to reduce abnormal strain on neck/ low back in supine and sitting    Other Self-Care Comments  reviewed how to perform trigger point release along upper trap and tools that can be used to assist with  treatment. how to perform sub-occipital release using tennis balls.      Exercises   Exercises Neck      Neck Exercises: Supine   Neck Retraction 10 reps   with towel to promote porper form     Manual Therapy   Manual Therapy Other (comment)    Manual therapy comments sub-occipital release x 5 min    Other Manual Therapy MTPR along bil upper trap / levator scapuale   combined with teaching how to perform at home using theracan     Neck Exercises: Stretches   Upper Trapezius Stretch 1 rep;30 seconds                  PT Education - 06/30/19 1020    Education Details reviewed HEP and changed seated chin tuck to supine. muscle anatomy and formation of trigger points. sleeping positoin using bolster to promote efficient positioning. manual trigger point release techniques.    Person(s) Educated Patient    Methods Explanation;Verbal cues;Handout    Comprehension Verbalized understanding;Verbal cues required            PT Short Term Goals - 06/23/19 1044      PT  SHORT TERM GOAL #1   Title Pt will be independent with intial HEP    Baseline no knowledge    Time 3    Period Weeks    Status New    Target Date 07/14/19      PT SHORT TERM GOAL #2   Title Pt will gain at least 10 degrees of cervical flexion in order to sleep more comfortably    Baseline Pt neck flexion limited to 20 degrees    Time 3    Period Weeks    Status New    Target Date 07/14/19      PT SHORT TERM GOAL #3   Title Pt will be able to sleep in bed in order to get more restful and restorative slelep    Baseline Pt must sleep upright in recliner due to neck pain    Time 3    Period Weeks    Status New    Target Date 07/14/19             PT Long Term Goals - 06/23/19 1252      PT LONG TERM GOAL #1   Title Demonstrate and verbalize techniques to reduce the risk of re-injury including: lifting, posture, body mechanics.    Baseline no knowlege    Time 8    Period Weeks    Status New    Target Date 08/18/19      PT LONG TERM GOAL #2   Title Pt will be able to sleep for at least 4 hours with restorative sleep pin bed with pain 2/10 or less    Baseline unable to sleep in bed , only reclinier    Time 8    Period Weeks    Status New    Target Date 08/18/19      PT LONG TERM GOAL #3   Title Pt will tolerate sitting 1 hour without increased pain to ride in car without increased pain.    Baseline Pt has pain up to 10/10 when neck pain flares    Time 8    Period Weeks    Status New    Target Date 08/18/19      PT LONG TERM GOAL #4   Title Pt will be able to perform household chores with  at least 50% decreased pain when having headache or onset of muscle flaring.    Baseline Pt unable to perform any housework when in headache of muscle flares    Time 8    Period Weeks    Status New    Target Date 08/18/19      PT LONG TERM GOAL #5   Title Pt will be able to return to some form of exericise  without exacerbating neck pain    Baseline Pt pain up to 10/10    Time  8    Period Weeks    Status New    Target Date 08/18/19                 Plan - 06/30/19 1021    Clinical Impression Statement Limited session today due to pain arriving 17 min late today. otpted to hold off on TPDN and focus on manual release techniques which she responded very well to noting no pain or HA following treatment. updated chin tuck exercise to supine to promote proper form.    PT Treatment/Interventions ADLs/Self Care Home Management;Cryotherapy;Electrical Stimulation;Iontophoresis 4mg /ml Dexamethasone;Moist Heat;Traction;Ultrasound;Therapeutic exercise;Therapeutic activities;Functional mobility training;Neuromuscular re-education;Patient/family education;Passive range of motion;Manual techniques;Dry needling;Taping;Spinal Manipulations;Joint Manipulations    PT Next Visit Plan DN PRN,   progress deep neck flexors, posture education and strengthening to promote efficient posture. STW along posterior cervical musculature/ bil upper trap.    PT Home Exercise Plan levator, upper trap scapular rolls, neck retraction (supine and seated)    Consulted and Agree with Plan of Care Patient           Patient will benefit from skilled therapeutic intervention in order to improve the following deficits and impairments:  Obesity, Pain, Postural dysfunction, Improper body mechanics, Increased muscle spasms, Decreased range of motion, Decreased strength, Impaired UE functional use  Visit Diagnosis: Cervicalgia  Abnormal posture  Muscle weakness (generalized)     Problem List Patient Active Problem List   Diagnosis Date Noted  . Prediabetes 05/05/2019  . Elevated cholesterol 05/05/2019  . Vitamin D deficiency 05/05/2019  . Obesity (BMI 30.0-34.9) 08/18/2013  . Follow-up examination, following unspecified surgery 11/16/2012  . Depression 08/14/2012   Starr Lake PT, DPT, LAT, ATC  06/30/19  10:26 AM      [facility] [address] [place], [place], [address] Phone: [phone]   Fax:  [phone]  Name: Caroline Carlson MRN: [id number] Date of Birth: Oct 17, 1980

## 2019-07-05 ENCOUNTER — Encounter: Payer: Self-pay | Admitting: Physical Medicine and Rehabilitation

## 2019-07-06 ENCOUNTER — Ambulatory Visit: Payer: Medicaid Other | Admitting: Orthopaedic Surgery

## 2019-07-06 ENCOUNTER — Encounter: Payer: Self-pay | Admitting: Orthopaedic Surgery

## 2019-07-06 DIAGNOSIS — M5020 Other cervical disc displacement, unspecified cervical region: Secondary | ICD-10-CM | POA: Diagnosis not present

## 2019-07-06 NOTE — Progress Notes (Signed)
Office Visit Note   Patient: Caroline Carlson           Date of Birth: December 07, 1980           MRN: [id number] Visit Date: 07/06/2019              Requested by: No referring provider defined for this encounter. PCP: Patient, No Pcp Per   Assessment & Plan: Visit Diagnoses:  1. Protrusion of cervical intervertebral disc     Plan:   She has been through physical therapy used traction.  She is gotten some improvement with epidural.  We reviewed the MRI scan and discuss small central bulge at C4-5 with only a trace flattening of the ventral cord.  Shows mild bulging at C5-6 with mild left foraminal narrowing.  We reviewed MRI scans discussed options for treatment including surgery.  I plan to recheck her back again in 1 month.  She can continue the Aleve and home exercise program as well as walking program.  Follow-Up Instructions: Return in about 1 month (around 08/05/2019).   Orders:  No orders of the defined types were placed in this encounter.  No orders of the defined types were placed in this encounter.     Procedures: No procedures performed   Clinical Data: No additional findings.   Subjective: Chief Complaint  Patient presents with  . Neck - Pain    HPI 39 year old female referred to me by Dr. Alphonzo Severance for consideration of cervical surgery.  She has had several months history of neck pain originally sharp now more achy.  She has been through physical therapy.  At times she has had some headaches sometimes related to her neck pain.  Pain radiates into her shoulder she denies numbness or tingling in her hand.  She is taking some Aleve since the middle of the night when she had problems with a headache.  No past history of significant injury to her neck but she can recall.  MRI scan cervical spine and thoracic spine has been performed.  No lower extremity weakness.  No falling no problems with stairs.  Positive for obesity, prediabetes elevated cholesterol and depression  noted.  Review of Systems noncontributory to HPI all other systems other than as mentioned above.   Objective: Vital Signs: Ht 5\' 5"  (1.651 m)   Wt 222 lb (100.7 kg)   LMP 09/11/2012   BMI 36.94 kg/m   Physical Exam Constitutional:      Appearance: She is well-developed.  HENT:     Head: Normocephalic.     Right Ear: External ear normal.     Left Ear: External ear normal.  Eyes:     Pupils: Pupils are equal, round, and reactive to light.  Neck:     Thyroid: No thyromegaly.     Trachea: No tracheal deviation.  Cardiovascular:     Rate and Rhythm: Normal rate.  Pulmonary:     Effort: Pulmonary effort is normal.  Abdominal:     Palpations: Abdomen is soft.  Skin:    General: Skin is warm and dry.  Neurological:     Mental Status: She is alert and oriented to person, place, and time.  Psychiatric:        Behavior: Behavior normal.     Ortho Exam upper extremity reflexes are 2+ and symmetrical.  No isolated motor weakness in upper extremities.  Mild brachial plexus tenderness right and left negative Lhermitte.  No lower extremity clonus.  No shoulder impingement.  Specialty Comments:  No specialty comments available.  Imaging: CLINICAL DATA:  Neck pain, spinal stenosis. Additional history provided: Additional history provided by technologist: Patient reports neck pain radiating into bilateral shoulders for a few months, no known injury.  EXAM: MRI CERVICAL SPINE WITHOUT CONTRAST  TECHNIQUE: Multiplanar, multisequence MR imaging of the cervical spine was performed. No intravenous contrast was administered.  COMPARISON:  Cervical spine radiographs 02/04/2019  FINDINGS: Alignment: Reversal of the expected cervical lordosis. No significant spondylolisthesis.  Vertebrae: Vertebral body height is maintained. No marrow edema or suspicious osseous lesion.  Cord: No spinal cord signal abnormality.  Posterior Fossa, vertebral arteries, paraspinal tissues:  No signal abnormality identified within included portions of the posterior fossa. Flow voids preserved within the cervical vertebral arteries. Paraspinal soft tissues within normal limits.  Disc levels:  Moderate C5-C6 and C6-C7 disc degeneration. Mild disc degeneration at the remaining levels.  C2-C3: No disc herniation. No significant canal or foraminal stenosis.  C3-C4: No disc herniation. No significant canal or foraminal stenosis.  C4-C5: Disc bulge. Superimposed small T2 hyperintense central disc protrusion. Uncinate hypertrophy. The protrusion contacts and mildly flattens the ventral spinal cord contributing to overall mild central canal stenosis. Mild relative left neural foraminal narrowing.  C5-C6: Shallow disc bulge with bilateral disc osteophyte ridge/uncinate hypertrophy. No significant spinal canal stenosis. Mild left neural foraminal narrowing.  C6-C7: Shallow disc bulge with uncinate hypertrophy. No significant spinal canal or neural foraminal narrowing.  C7-T1: Mild facet/ligamentum flavum hypertrophy on the left. No disc herniation. No significant canal or foraminal stenosis.  IMPRESSION: Cervical spondylosis as outlined and most notably as follows.  At C4-C5, disc bulge with superimposed small central disc protrusion. Uncinate hypertrophy. The disc protrusion contacts and mildly flattens the ventral spinal cord contributing to overall mild spinal canal stenosis. Mild left neural foraminal narrowing.  At C5-C6, disc bulge with osteophyte ridge and uncinate hypertrophy contribute to mild left neural foraminal narrowing.   Electronically Signed   By: Kellie Simmering DO   On: 02/26/2019 09:23  Narrative & Impression  CLINICAL DATA:  Pain in thoracic spine. Additional history provided by technologist: Patient reports neck pain radiating into bilateral shoulders and upper thoracic spine for a few months.  EXAM: MRI THORACIC SPINE WITHOUT  CONTRAST  TECHNIQUE: Multiplanar, multisequence MR imaging of the thoracic spine was performed. No intravenous contrast was administered.  COMPARISON:  Concurrently performed MRI of the cervical spine, thoracic spine radiographs 11/23/2018  FINDINGS: Alignment: A mild thoracic levocurvature may be positional. No significant spondylolisthesis.  Vertebrae: Vertebral body height is maintained. Small T7 vertebral body hemangioma. No marrow edema or suspicious osseous lesion.  Cord:  No spinal cord signal abnormality.  Paraspinal and other soft tissues: No abnormality identified within included portions of the thorax/upper abdomen. Paraspinal soft tissues within normal limits.  Disc levels:  Levels of mild disc degeneration greatest within the midthoracic spine.  At T4-T5, there is a small disc bulge with superimposed small right center/foraminal disc protrusion. The disc protrusion partially effaces the right ventral thecal sac and likely contacts the ventral spinal cord. Only mild relative central canal narrowing. The disc protrusion contributes to mild right neural foraminal narrowing.  At T5-T6, tiny right foraminal disc protrusion. No significant spinal canal or neural foraminal narrowing.  At T6-T7, minimal disc bulge. No significant spinal canal or neural foraminal narrowing.  At T9-T10, broad-based left center/foraminal disc protrusion. The protrusion mildly effaces the left ventral thecal sac without significant central canal stenosis. Mild left neural foraminal  narrowing.  IMPRESSION: Thoracic spondylosis as outlined and most notably as follows.  At T4-T5, small disc bulge with superimposed small right center/foraminal disc protrusion. The disc protrusion partially effaces the right ventral thecal sac and likely contacts the ventral spinal cord. Only mild relative central canal narrowing. Mild right neural foraminal narrowing.  At T9-T10,  broad-based left center/foraminal disc protrusion. Mild left neural foraminal narrowing. The disc protrusion also mildly effaces the left ventral thecal sac without significant central canal stenosis.   Electronically Signed   By: Kellie Simmering DO   On: 02/26/2019 09:39      PMFS History: Patient Active Problem List   Diagnosis Date Noted  . Protrusion of cervical intervertebral disc 07/07/2019  . Prediabetes 05/05/2019  . Elevated cholesterol 05/05/2019  . Vitamin D deficiency 05/05/2019  . Obesity (BMI 30.0-34.9) 08/18/2013  . Follow-up examination, following unspecified surgery 11/16/2012  . Depression 08/14/2012   Past Medical History:  Diagnosis Date  . Anemia   . Anxiety   . Back pain   . Constipation   . Depression   . Fibromyalgia   . History of blood transfusion 06/2002   [facility] - unsure of number of units  . Hyperlipidemia    borderline - diet controlled  . Mental disorder    Currently depressed  . Panic attack   . PONV (postoperative nausea and vomiting)     Family History  Problem Relation Age of Onset  . Diabetes Mother   . High blood pressure Mother   . Stroke Mother   . Heart disease Mother   . Anxiety disorder Mother   . Heart disease Other   . Hypertension Other     Past Surgical History:  Procedure Laterality Date  . BILATERAL SALPINGECTOMY N/A 09/25/2012   Procedure: BILATERAL SALPINGECTOMY;  Surgeon: Lahoma Crocker, MD;  Location: [facility];  Service: Gynecology;  Laterality: N/A;  . CESAREAN SECTION  2002, 2004, 2006   x 3  . ROBOTIC ASSISTED TOTAL HYSTERECTOMY N/A 09/25/2012   Procedure: ROBOTIC ASSISTED TOTAL HYSTERECTOMY;  Surgeon: Lahoma Crocker, MD;  Location: [facility];  Service: Gynecology;  Laterality: N/A;  . TUBAL LIGATION     Social History   Occupational History  . Occupation: out of work for the last year  Tobacco Use  . Smoking status: Former Smoker    Packs/day: 0.20    Years: 0.50    Pack years: 0.10    Types:  Cigarettes  . Smokeless tobacco: Never Used  . Tobacco comment: trying to quit  Substance and Sexual Activity  . Alcohol use: No  . Drug use: No  . Sexual activity: Yes    Birth control/protection: Surgical

## 2019-07-07 ENCOUNTER — Telehealth: Payer: Self-pay | Admitting: Orthopaedic Surgery

## 2019-07-07 ENCOUNTER — Encounter: Payer: Self-pay | Admitting: Rehabilitative and Restorative Service Providers"

## 2019-07-07 ENCOUNTER — Ambulatory Visit: Payer: Medicaid Other | Admitting: Physical Therapy

## 2019-07-07 ENCOUNTER — Other Ambulatory Visit: Payer: Self-pay | Admitting: Orthopaedic Surgery

## 2019-07-07 ENCOUNTER — Ambulatory Visit: Payer: Medicaid Other | Admitting: Rehabilitative and Restorative Service Providers"

## 2019-07-07 ENCOUNTER — Other Ambulatory Visit: Payer: Self-pay

## 2019-07-07 DIAGNOSIS — M6281 Muscle weakness (generalized): Secondary | ICD-10-CM

## 2019-07-07 DIAGNOSIS — M542 Cervicalgia: Secondary | ICD-10-CM | POA: Diagnosis not present

## 2019-07-07 DIAGNOSIS — R293 Abnormal posture: Secondary | ICD-10-CM

## 2019-07-07 DIAGNOSIS — M5020 Other cervical disc displacement, unspecified cervical region: Secondary | ICD-10-CM | POA: Insufficient documentation

## 2019-07-07 MED ORDER — ACETAMINOPHEN-CODEINE #3 300-30 MG PO TABS: 1.0000 | ORAL_TABLET | Freq: Three times a day (TID) | ORAL | 0 refills | Status: DC | PRN

## 2019-07-07 NOTE — Telephone Encounter (Signed)
Done ucall thanks

## 2019-07-07 NOTE — Telephone Encounter (Signed)
Patient called requesting a refill on tylenol 3. Patient states pharmacy has given her problems with filling prescription. Please send to [facility] [address]. Please call patient when script has been sent in. Patient phone number is [phone]

## 2019-07-07 NOTE — Telephone Encounter (Signed)
Please advise 

## 2019-07-07 NOTE — Telephone Encounter (Signed)
I called patient. She states that the pharmacy was needing authorization to fill the medication. She found out that it was $10 and paid that today to get script filled.

## 2019-07-07 NOTE — Therapy (Signed)
[place], [place], [address] Phone: [phone]   Fax:  [phone]  Physical Therapy Treatment  Patient Details  Name: Caroline Carlson MRN: [id number] Date of Birth: 04-29-1980 Referring Provider (PT): Meredith Pel MD   Encounter Date: 07/07/2019   PT End of Session - 07/07/19 1701    Visit Number 3    Number of Visits 16    Date for PT Re-Evaluation 08/18/19    Authorization Type MCD resubmit at 4th visit.    Authorization Time Period 06/25/2019 - 07/15/2019    Authorization - Visit Number 2    PT Start Time 0503    PT Stop Time 3212    PT Time Calculation (min) 55 min    Activity Tolerance Patient limited by pain;Patient tolerated treatment well    Behavior During Therapy [facility] for tasks assessed/performed           Past Medical History:  Diagnosis Date  . Anemia   . Anxiety   . Back pain   . Constipation   . Depression   . Fibromyalgia   . History of blood transfusion 06/2002   [facility] - unsure of number of units  . Hyperlipidemia    borderline - diet controlled  . Mental disorder    Currently depressed  . Panic attack   . PONV (postoperative nausea and vomiting)     Past Surgical History:  Procedure Laterality Date  . BILATERAL SALPINGECTOMY N/A 09/25/2012   Procedure: BILATERAL SALPINGECTOMY;  Surgeon: Lahoma Crocker, MD;  Location: [facility];  Service: Gynecology;  Laterality: N/A;  . CESAREAN SECTION  2002, 2004, 2006   x 3  . ROBOTIC ASSISTED TOTAL HYSTERECTOMY N/A 09/25/2012   Procedure: ROBOTIC ASSISTED TOTAL HYSTERECTOMY;  Surgeon: Lahoma Crocker, MD;  Location: [facility];  Service: Gynecology;  Laterality: N/A;  . TUBAL LIGATION      There were no vitals filed for this visit.   Subjective Assessment - 07/07/19 1752    Subjective I was hurting really bad early in my back and had to take pain medicine. It travelled up into my head on the side. It is better now.    How  long can you sit comfortably? unlimited    How long can you stand comfortably? 15 min    How long can you walk comfortably? I try to work through it trying to walk 2-3 miles because I need to exericise    Patient Stated Goals I want to be able to care for kids and exercise without having to deal with constant pain    Currently in Pain? Yes    Pain Score 6     Pain Location Thoracic    Pain Orientation Right;Left    Pain Descriptors / Indicators Aching;Spasm    Pain Type Chronic pain    Pain Radiating Towards sometimes radiates to lateral arm and up into the head    Pain Onset More than a month ago    Pain Frequency Intermittent    Aggravating Factors  can't sleep in bed    Pain Relieving Factors heat and thing from [employer]    Multiple Pain Sites No                             [facility] Adult PT Treatment/Exercise - 07/07/19 0001      Neck Exercises: Seated   Other Seated Exercise Bil Levator/Scalene stretches 3x30 sec;  attempted cervical retract but pt unable to perform without compensation       Neck Exercises: Sidelying   Other Sidelying Exercise R sidelying L scapular depression stretch to stretch L cervical musculature with pain increasing; pain reduced with scapular retraction. therefore, scap retract 3 way with varying angles performed with PT tactile cueing for direction with retraction alleviating pain      Modalities   Modalities Ultrasound      Moist Heat Therapy   Number Minutes Moist Heat 15 Minutes    Moist Heat Location --   L cervical in sitting     Ultrasound   Ultrasound Location R Upper trap primarily x 6 min and L Upper Trap x 2 min    Ultrasound Parameters 100%, 2.5w/cm2    Ultrasound Goals Pain;Other (Comment)   muscle relaxation     Manual Therapy   Manual therapy comments soft tissue work, trigger point bil Upper Trap/Rhomboids in sitting and R sidelying                    PT Short Term Goals - 07/07/19 1813      PT SHORT  TERM GOAL #1   Title Pt will be independent with intial HEP    Time 3    Period Weeks    Status On-going    Target Date 07/14/19      PT SHORT TERM GOAL #2   Title Pt will gain at least 10 degrees of cervical flexion in order to sleep more comfortably    Time 3    Period Weeks    Status On-going    Target Date 07/14/19      PT SHORT TERM GOAL #3   Title Pt will be able to sleep in bed in order to get more restful and restorative slelep    Time 3    Period Weeks    Status On-going    Target Date 07/14/19             PT Long Term Goals - 06/23/19 1252      PT LONG TERM GOAL #1   Title Demonstrate and verbalize techniques to reduce the risk of re-injury including: lifting, posture, body mechanics.    Baseline no knowlege    Time 8    Period Weeks    Status New    Target Date 08/18/19      PT LONG TERM GOAL #2   Title Pt will be able to sleep for at least 4 hours with restorative sleep pin bed with pain 2/10 or less    Baseline unable to sleep in bed , only reclinier    Time 8    Period Weeks    Status New    Target Date 08/18/19      PT LONG TERM GOAL #3   Title Pt will tolerate sitting 1 hour without increased pain to ride in car without increased pain.    Baseline Pt has pain up to 10/10 when neck pain flares    Time 8    Period Weeks    Status New    Target Date 08/18/19      PT LONG TERM GOAL #4   Title Pt will be able to perform household chores with at least 50% decreased pain when having headache or onset of muscle flaring.    Baseline Pt unable to perform any housework when in headache of muscle flares    Time 8    Period  Weeks    Status New    Target Date 08/18/19      PT LONG TERM GOAL #5   Title Pt will be able to return to some form of exericise  without exacerbating neck pain    Baseline Pt pain up to 10/10    Time 8    Period Weeks    Status New    Target Date 08/18/19                 Plan - 07/07/19 1809    Clinical Impression  Statement Pt presents to PT with tightness bil Upper Trap and Rhomboids. She is not tolerable of manual therapy, esp deep tissue, at this time and especially in sidelying. Scapular retract reduces pain and streches increase pain. Continue modalities. PT prepped patient that stretching will need to be done to her tolerance to stretch out musculature and trigger points. At this time,she prefers contraction for pain reduction. Pt would continue to benefit from manual therapy, flexibility of periscapular and cervical muscles to tolerance, and postural strengthening.    Rehab Potential Good    PT Frequency 2x / week    PT Duration 8 weeks    PT Treatment/Interventions ADLs/Self Care Home Management;Cryotherapy;Electrical Stimulation;Iontophoresis 4mg /ml Dexamethasone;Moist Heat;Traction;Ultrasound;Therapeutic exercise;Therapeutic activities;Functional mobility training;Neuromuscular re-education;Patient/family education;Passive range of motion;Manual techniques;Dry needling;Taping;Spinal Manipulations;Joint Manipulations    PT Next Visit Plan DN PRN,   progress deep neck flexors, posture education and strengthening to promote efficient posture. STW along posterior cervical musculature/ bil upper trap.    PT Home Exercise Plan levator, upper trap scapular rolls, neck retraction (supine and seated)    Consulted and Agree with Plan of Care Patient           Patient will benefit from skilled therapeutic intervention in order to improve the following deficits and impairments:  Obesity, Pain, Postural dysfunction, Improper body mechanics, Increased muscle spasms, Decreased range of motion, Decreased strength, Impaired UE functional use  Visit Diagnosis: Cervicalgia  Abnormal posture  Muscle weakness (generalized)     Problem List Patient Active Problem List   Diagnosis Date Noted  . Protrusion of cervical intervertebral disc 07/07/2019  . Prediabetes 05/05/2019  . Elevated cholesterol 05/05/2019    . Vitamin D deficiency 05/05/2019  . Obesity (BMI 30.0-34.9) 08/18/2013  . Follow-up examination, following unspecified surgery 11/16/2012  . Depression 08/14/2012    Myra Rude, PT 07/07/2019, 6:16 PM  [facility] [address] [place], [place], [address] Phone: [phone]   Fax:  [phone]  Name: Caroline Carlson MRN: [id number] Date of Birth: 1980/05/25

## 2019-07-07 NOTE — Patient Instructions (Signed)
PT discussed with pt to see if scapular retract decreases pain when she is at home prior to taking meds. She agreed.

## 2019-07-08 ENCOUNTER — Encounter (INDEPENDENT_AMBULATORY_CARE_PROVIDER_SITE_OTHER): Payer: Self-pay | Admitting: Bariatrics

## 2019-07-08 ENCOUNTER — Ambulatory Visit (INDEPENDENT_AMBULATORY_CARE_PROVIDER_SITE_OTHER): Payer: Medicaid Other | Admitting: Bariatrics

## 2019-07-08 VITALS — BP 112/78 | HR 95 | Temp 98.5°F | Ht 65.0 in | Wt 226.0 lb

## 2019-07-08 DIAGNOSIS — R7303 Prediabetes: Secondary | ICD-10-CM

## 2019-07-08 DIAGNOSIS — Z6837 Body mass index (BMI) 37.0-37.9, adult: Secondary | ICD-10-CM

## 2019-07-08 DIAGNOSIS — F3289 Other specified depressive episodes: Secondary | ICD-10-CM

## 2019-07-08 DIAGNOSIS — E559 Vitamin D deficiency, unspecified: Secondary | ICD-10-CM

## 2019-07-08 MED ORDER — BUPROPION HCL ER (SR) 150 MG PO TB12: 150.0000 mg | ORAL_TABLET | Freq: Every day | ORAL | 0 refills | Status: DC

## 2019-07-12 ENCOUNTER — Encounter (INDEPENDENT_AMBULATORY_CARE_PROVIDER_SITE_OTHER): Payer: Self-pay | Admitting: Bariatrics

## 2019-07-12 NOTE — Progress Notes (Signed)
Chief Complaint:   OBESITY Caroline Carlson is here to discuss her progress with her obesity treatment plan along with follow-up of her obesity related diagnoses. Caroline Carlson is on the Category 2 Plan and states she is following her eating plan approximately 0% of the time. Caroline Carlson states she is exercising 0 minutes 0 times per week.  Today's visit was #: 5 Starting weight: 223 lbs Starting date: 05/03/2019 Today's weight: 226 lbs Today's date: 07/08/2019 Total lbs lost to date: 0 Total lbs lost since last in-office visit: 0  Interim History: Caroline Carlson is up 3 lbs. She is having PT and has had some pain. She states she has not been eating right.  Subjective:   Pre-diabetes. Caroline Carlson has a diagnosis of prediabetes based on her elevated HgA1c and was informed this puts her at greater risk of developing diabetes. She continues to work on diet and exercise to decrease her risk of diabetes. She denies nausea or hypoglycemia. Caroline Carlson is taking Glucophage (has not started metformin as she has a normal appetite.  Lab Results  Component Value Date   HGBA1C 6.0 (H) 05/04/2019   Lab Results  Component Value Date   INSULIN 25.3 (H) 05/04/2019   Vitamin D deficiency. Caroline Carlson is taking Vitamin D supplementation.    Ref. Range 05/04/2019 14:43  Vitamin D, 25-Hydroxy Latest Ref Range: 30.0 - 100.0 ng/mL 10.3 (L)   Other depression with emotional eating. Caroline Carlson is struggling with emotional eating and using food for comfort to the extent that it is negatively impacting her health. She has been working on behavior modification techniques to help reduce her emotional eating and has been somewhat successful. She shows no sign of suicidal or homicidal ideations. Caroline Carlson is on Wellbutrin and denies contraindications.  Assessment/Plan:   Pre-diabetes. Caroline Carlson will continue to work on weight loss, exercise, and decreasing simple carbohydrates to help decrease the risk of  diabetes. She will consider metformin.  Vitamin D deficiency. Low Vitamin D level contributes to fatigue and are associated with obesity, breast, and colon cancer. She agrees to continue to take Vitamin D and will follow-up for routine testing of Vitamin D, at least 2-3 times per year to avoid over-replacement.  Other depression with emotional eating. Behavior modification techniques were discussed today to help Caroline Carlson deal with her emotional/non-hunger eating behaviors.  Orders and follow up as documented in patient record. Prescription was given for buPROPion (WELLBUTRIN SR) 150 MG 12 hr tablet 1 PO daily #30 with 0 refills.  Class 2 severe obesity with serious comorbidity and body mass index (BMI) of 37.0 to 37.9 in adult, unspecified obesity type ([place]).  Caroline Carlson is currently in the action stage of change. As such, her goal is to continue with weight loss efforts. She has agreed to the Category 2 Plan.   She will work on meal planning, intentional eating, and increasing her water and protein intake.   Exercise goals: Caroline Carlson will continue PT (neck).  Behavioral modification strategies: increasing lean protein intake, decreasing simple carbohydrates, increasing vegetables, increasing water intake, decreasing eating out, no skipping meals, meal planning and cooking strategies, keeping healthy foods in the home and planning for success.  Caroline Carlson has agreed to follow-up with our clinic in 2-3 weeks. She was informed of the importance of frequent follow-up visits to maximize her success with intensive lifestyle modifications for her multiple health conditions.   Objective:   Blood pressure 112/78, pulse 95, temperature 98.5 F (36.9 C), height 5\' 5"  (1.651 m), weight 226  lb (102.5 kg), last menstrual period 09/11/2012, SpO2 97 %. Body mass index is 37.61 kg/m.  General: Cooperative, alert, well developed, in no acute distress. HEENT: Conjunctivae and lids  unremarkable. Cardiovascular: Regular rhythm.  Lungs: Normal work of breathing. Neurologic: No focal deficits.   Lab Results  Component Value Date   CREATININE 0.81 11/21/2018   BUN 14 11/21/2018   NA 139 11/21/2018   K 3.9 11/21/2018   CL 109 11/21/2018   CO2 22 11/21/2018   Lab Results  Component Value Date   ALT 13 10/20/2013   AST 15 10/20/2013   ALKPHOS 71 10/20/2013   BILITOT 0.4 10/20/2013   Lab Results  Component Value Date   HGBA1C 6.0 (H) 05/04/2019   HGBA1C 6.0 (H) 10/20/2013   Lab Results  Component Value Date   INSULIN 25.3 (H) 05/04/2019   Lab Results  Component Value Date   TSH 0.908 05/04/2019   Lab Results  Component Value Date   CHOL 232 (H) 05/04/2019   HDL 57 05/04/2019   LDLCALC 157 (H) 05/04/2019   TRIG 103 05/04/2019   CHOLHDL 4.6 08/14/2012   Lab Results  Component Value Date   WBC 10.6 (H) 11/21/2018   HGB 13.5 11/21/2018   HCT 41.2 11/21/2018   MCV 92.2 11/21/2018   PLT 356 11/21/2018   No results found for: IRON, TIBC, FERRITIN  Attestation Statements:   Reviewed by clinician on day of visit: allergies, medications, problem list, medical history, surgical history, family history, social history, and previous encounter notes.  Time spent on visit including pre-visit chart review and post-visit charting and care was 20 minutes.   Caroline Carlson, am acting as Location manager for [employer], DO   Caroline Carlson have reviewed the above documentation for accuracy and completeness, and Caroline Carlson agree with the above. Jearld Lesch, DO

## 2019-07-13 ENCOUNTER — Other Ambulatory Visit: Payer: Self-pay

## 2019-07-13 ENCOUNTER — Encounter: Payer: Self-pay | Admitting: Physical Therapy

## 2019-07-13 ENCOUNTER — Ambulatory Visit: Payer: Medicaid Other | Admitting: Physical Therapy

## 2019-07-13 DIAGNOSIS — R293 Abnormal posture: Secondary | ICD-10-CM

## 2019-07-13 DIAGNOSIS — M542 Cervicalgia: Secondary | ICD-10-CM | POA: Diagnosis not present

## 2019-07-13 DIAGNOSIS — M6281 Muscle weakness (generalized): Secondary | ICD-10-CM

## 2019-07-13 NOTE — Therapy (Addendum)
[place], [place], [address] Phone: [phone]   Fax:  [phone]  Physical Therapy Treatment / Discharge  Patient Details  Name: Caroline Carlson MRN: [id number] Date of Birth: Jun 11, 1980 Referring Provider (PT): Meredith Pel MD   Encounter Date: 07/13/2019   PT End of Session - 07/13/19 1023    Visit Number 4    Number of Visits 16    Date for PT Re-Evaluation 08/18/19    Authorization Type MCD resubmit at 4th visit.    Authorization - Visit Number 4    Authorization - Number of Visits 4    PT Start Time 1020    PT Stop Time 1116    PT Time Calculation (min) 56 min    Activity Tolerance Patient limited by pain    Behavior During Therapy WFL for tasks assessed/performed           Past Medical History:  Diagnosis Date  . Anemia   . Anxiety   . Back pain   . Constipation   . Depression   . Fibromyalgia   . History of blood transfusion 06/2002   [facility] - unsure of number of units  . Hyperlipidemia    borderline - diet controlled  . Mental disorder    Currently depressed  . Panic attack   . PONV (postoperative nausea and vomiting)     Past Surgical History:  Procedure Laterality Date  . BILATERAL SALPINGECTOMY N/A 09/25/2012   Procedure: BILATERAL SALPINGECTOMY;  Surgeon: Lahoma Crocker, MD;  Location: [facility];  Service: Gynecology;  Laterality: N/A;  . CESAREAN SECTION  2002, 2004, 2006   x 3  . ROBOTIC ASSISTED TOTAL HYSTERECTOMY N/A 09/25/2012   Procedure: ROBOTIC ASSISTED TOTAL HYSTERECTOMY;  Surgeon: Lahoma Crocker, MD;  Location: [facility];  Service: Gynecology;  Laterality: N/A;  . TUBAL LIGATION      There were no vitals filed for this visit.   Subjective Assessment - 07/13/19 1026    Subjective I was hurting in my neck and now it is my upper back. actually down into my hips.  I have a headache  I am able to do my exericises.  I can sleep in my bed about 3-4 hours but  I return to the recliner for the rest of the night mostly for my low back    Pertinent History depression, obesity, prediabetes and Hyperlipidemia    Limitations Sitting;Standing;Walking;House hold activities    How long can you sit comfortably? unlimited    How long can you stand comfortably? 15 min but with less pain    How long can you walk comfortably? no longer doing any walking because of pain    Patient Stated Goals I want to be able to care for kids and exercise without having to deal with constant pain    Currently in Pain? Yes    Pain Score 8     Pain Location Thoracic    Pain Orientation Right;Left;Mid    Pain Descriptors / Indicators Aching;Spasm    Pain Type Chronic pain    Pain Onset More than a month ago    Pain Frequency Intermittent    Multiple Pain Sites Yes    Pain Score 6    Pain Location Neck    Pain Orientation Right;Left    Pain Descriptors / Indicators Aching;Spasm    Pain Onset More than a month ago    Pain Frequency Intermittent  [facility] PT Assessment - 07/13/19 0001      Assessment   Medical Diagnosis Neck pain and headaches    Referring Provider (PT) Meredith Pel MD      ROM / Strength   AROM / PROM / Strength AROM;Strength      AROM   Right Shoulder Flexion 160 Degrees    Right Shoulder ABduction 155 Degrees    Left Shoulder Flexion 170 Degrees    Left Shoulder ABduction 164 Degrees    Cervical Flexion 32   ERP better post needling   Cervical Extension 40    Cervical - Right Side Bend 25    Cervical - Left Side Bend 25    Cervical - Right Rotation 60    Cervical - Left Rotation 55      Strength   Overall Strength Deficits    Right Shoulder Flexion 4+/5    Right Shoulder Extension 5/5    Right Shoulder ABduction 4/5    Right Shoulder Internal Rotation 4+/5    Right Shoulder External Rotation 4/5    Left Shoulder Flexion 4+/5    Left Shoulder Extension 5/5    Left Shoulder ABduction 4/5    Left Shoulder Internal  Rotation 4+/5    Left Shoulder External Rotation 4-/5      Palpation   Palpation comment Pt with elevated upper trap and levator and tight/tender thoracic and cervical paraspinals, pt also complains of bil hip pain today(poor sleep positioning                         [facility] Adult PT Treatment/Exercise - 07/13/19 0001      Self-Care   Other Self-Care Comments  reviewed some sleep postures and use of pillows for positioning and comfort for sleep in bed      Exercises   Exercises Neck      Neck Exercises: Seated   Other Seated Exercise Bil Levator/Scalene stretches 3x30 sec; attempted cervical retract but pt unable to perform without compensation       Neck Exercises: Prone   Other Prone Exercise q ped thoracic stretch with elbow to ceiling 5 x to RT and to LT with 15 to 20 sec hold   , open book exericises on wall      Manual Therapy   Manual Therapy Joint mobilization;Soft tissue mobilization    Manual therapy comments skilled palpation iwth TPDN    Joint Mobilization PA mobs of thoracic grade 2/3    Soft tissue mobilization STW to upper trap, levator rhomboids.    Other Manual Therapy full body stretch with piriformis strech included for full back stretch with trunk flexion            Trigger Point Dry Needling - 07/13/19 0001    Consent Given? Yes    Education Handout Provided Previously provided    Muscles Treated Head and Neck Upper trapezius;Levator scapulae;Cervical multifidi   bil   Muscles Treated Upper Quadrant Rhomboids   bil   Muscles Treated Back/Hip Thoracic multifidi    Upper Trapezius Response Twitch reponse elicited;Palpable increased muscle length    Levator Scapulae Response Twitch response elicited;Palpable increased muscle length    Cervical multifidi Response Twitch reponse elicited;Palpable increased muscle length   bil C3/4   Rhomboids Response Twitch response elicited;Palpable increased muscle length    Thoracic multifidi response Twitch  response elicited;Palpable increased muscle length   T-3 toT-8  PT Short Term Goals - 07/13/19 1024      PT SHORT TERM GOAL #1   Title Pt will be independent with intial HEP    Baseline no knowledge    Time 3    Period Weeks    Status Achieved    Target Date 07/14/19      PT SHORT TERM GOAL #2   Title Pt will gain at least 10 degrees of cervical flexion in order to sleep more comfortably    Baseline Pt now 32 degrees after TPDN    Time 3    Period Weeks    Status Achieved      PT SHORT TERM GOAL #3   Title Pt will be able to sleep in bed in order to get more restful and restorative slelep    Baseline Pt must sleep upright in recliner due to low back . Pt is trying to return to bed with sleep positioning ideas but not comfortable at this time can sleep in bed for 3-4 hours only    Time 3    Period Weeks    Status Partially Met    Target Date 07/14/19             PT Long Term Goals - 06/23/19 1252      PT LONG TERM GOAL #1   Title Demonstrate and verbalize techniques to reduce the risk of re-injury including: lifting, posture, body mechanics.    Baseline no knowlege    Time 8    Period Weeks    Status New    Target Date 08/18/19      PT LONG TERM GOAL #2   Title Pt will be able to sleep for at least 4 hours with restorative sleep pin bed with pain 2/10 or less    Baseline unable to sleep in bed , only reclinier    Time 8    Period Weeks    Status New    Target Date 08/18/19      PT LONG TERM GOAL #3   Title Pt will tolerate sitting 1 hour without increased pain to ride in car without increased pain.    Baseline Pt has pain up to 10/10 when neck pain flares    Time 8    Period Weeks    Status New    Target Date 08/18/19      PT LONG TERM GOAL #4   Title Pt will be able to perform household chores with at least 50% decreased pain when having headache or onset of muscle flaring.    Baseline Pt unable to perform any housework when in  headache of muscle flares    Time 8    Period Weeks    Status New    Target Date 08/18/19      PT LONG TERM GOAL #5   Title Pt will be able to return to some form of exericise  without exacerbating neck pain    Baseline Pt pain up to 10/10    Time 8    Period Weeks    Status New    Target Date 08/18/19                 Plan - 07/13/19 1118    Clinical Impression Statement Pt returns for 3rd visit after evaluation and has gained 12 degrees in cervical flexion and is independent. STG # 1 and 2 achieved and # 3 partially met.  Pt clearly would benefit from additinal PT  to address her deficits.  She is unable to sleep throught the night in a bed and continues to have gaurded movement.  She has not been walking because of snakes on her 2-3 mile path and believe this lack of movement in contributing as well as her continued pain in neck and upper thoracic area. Pt was taught more specific thoracic stretches today.  Caroline Carlson will benefit from continuing skilled PT and advanced exericies.2 x a week for 5 weeks. Pain in thoracic is 8/10 and neck 6/10 at beginning of session and reduces with exericise and TPDN/Manual.  Pt has been taught some self techniques but is highly irritable at this time.    She consented to [facility] and had immediate relief of headache and decreased tissue tension of bil upper traps and thoracic/cervical paraspinals.  Will continue POC    Personal Factors and Comorbidities Comorbidity 1    Comorbidities depression, obesity, prediabetes and Hyperlipidemia    Examination-Activity Limitations Sleep;Lift    Examination-Participation Restrictions Laundry;Cleaning    PT Frequency 2x / week    PT Duration --   5 weeks   PT Treatment/Interventions ADLs/Self Care Home Management;Cryotherapy;Electrical Stimulation;Iontophoresis 64m/ml Dexamethasone;Moist Heat;Traction;Ultrasound;Therapeutic exercise;Therapeutic activities;Functional mobility training;Neuromuscular  re-education;Patient/family education;Passive range of motion;Manual techniques;Dry needling;Taping;Spinal Manipulations;Joint Manipulations    PT Next Visit Plan DN PRN,   progress deep neck flexors, posture education and strengthening to promote efficient posture. STW along posterior cervical musculature/ bil upper trap. Sleep hygiene , fibormyalgia education/community wellness    PT Home Exercise Plan levator, upper trap scapular rolls, neck retraction (supine and seated)q ped thoracic stretch,  book opening exeicise    Consulted and Agree with Plan of Care Patient           Patient will benefit from skilled therapeutic intervention in order to improve the following deficits and impairments:  Obesity, Pain, Postural dysfunction, Improper body mechanics, Increased muscle spasms, Decreased range of motion, Decreased strength, Impaired UE functional use  Visit Diagnosis: Cervicalgia  Abnormal posture  Muscle weakness (generalized)     Problem List Patient Active Problem List   Diagnosis Date Noted  . Protrusion of cervical intervertebral disc 07/07/2019  . Prediabetes 05/05/2019  . Elevated cholesterol 05/05/2019  . Vitamin D deficiency 05/05/2019  . Obesity (BMI 30.0-34.9) 08/18/2013  . Follow-up examination, following unspecified surgery 11/16/2012  . Depression 08/14/2012    LVoncille Lo PT Certified Exercise Expert for the Aging Adult  07/13/19 11:36 AM Phone: [phone]: 3[facility]GB[address] [address]Phone: [phone]  Fax:  [phone] Name: KKANIYAH LISBYMRN: [id number] of Birth: [birth year]      PHYSICAL THERAPY DISCHARGE SUMMARY  Visits from Start of Care: 4  Current functional level related to goals / functional outcomes: See goals   Remaining deficits: unknown   Education / Equipment: HEP, theraband, posture  Plan: Patient agrees  to discharge.  Patient goals were not met. Patient is being discharged due to not returning since the last visit.  ?????        Kristoffer Leamon PT, DPT, LAT, ATC  08/05/19  3:19 PM

## 2019-07-22 ENCOUNTER — Other Ambulatory Visit: Payer: Self-pay

## 2019-07-22 ENCOUNTER — Ambulatory Visit (INDEPENDENT_AMBULATORY_CARE_PROVIDER_SITE_OTHER): Payer: BC Managed Care – PPO | Admitting: Bariatrics

## 2019-07-22 VITALS — BP 125/76 | HR 95 | Temp 98.5°F | Ht 65.0 in | Wt 224.0 lb

## 2019-07-22 DIAGNOSIS — Z6837 Body mass index (BMI) 37.0-37.9, adult: Secondary | ICD-10-CM

## 2019-07-22 DIAGNOSIS — E559 Vitamin D deficiency, unspecified: Secondary | ICD-10-CM | POA: Diagnosis not present

## 2019-07-22 DIAGNOSIS — R7303 Prediabetes: Secondary | ICD-10-CM | POA: Diagnosis not present

## 2019-07-22 MED ORDER — METFORMIN HCL 500 MG PO TABS: 500.0000 mg | ORAL_TABLET | Freq: Two times a day (BID) | ORAL | 0 refills | Status: DC

## 2019-07-22 MED ORDER — VITAMIN D (ERGOCALCIFEROL) 1.25 MG (50000 UNIT) PO CAPS: 50000.0000 [IU] | ORAL_CAPSULE | ORAL | 0 refills | Status: DC

## 2019-07-26 ENCOUNTER — Encounter (INDEPENDENT_AMBULATORY_CARE_PROVIDER_SITE_OTHER): Payer: Self-pay | Admitting: Bariatrics

## 2019-07-26 NOTE — Progress Notes (Signed)
Chief Complaint:   Caroline Carlson is here to discuss her progress with her Caroline treatment plan along with follow-up of her Caroline related diagnoses. Caroline Carlson is on the Category 2 Plan and states she is following her eating plan approximately 80% of the time. Caroline Carlson states she is exercising 0 minutes 0 times per week.  Today's visit was #: 6 Starting weight: 223 lbs Starting date: 05/03/2019 Today's weight: 224 lbs Today's date: 07/22/2019 Total lbs lost to date: 0 Total lbs lost since last in-office visit: 2  Interim History: Caroline Carlson is down 2 lbs and doing better. She states she ate more over the 4th.  Subjective:   Vitamin D deficiency. No nausea, vomiting, or muscle weakness.    Ref. Range 05/04/2019 14:43  Vitamin D, 25-Hydroxy Latest Ref Range: 30.0 - 100.0 ng/mL 10.3 (L)   Pre-diabetes. Caroline Carlson has a diagnosis of prediabetes based on her elevated HgA1c and was informed this puts her at greater risk of developing diabetes. She continues to work on diet and exercise to decrease her risk of diabetes. She denies nausea or hypoglycemia. Caroline Carlson is taking Glucophage.  Lab Results  Component Value Date   HGBA1C 6.0 (H) 05/04/2019   Lab Results  Component Value Date   INSULIN 25.3 (H) 05/04/2019   Assessment/Plan:   Vitamin D deficiency. Low Vitamin D level contributes to fatigue and are associated with Caroline, breast, and colon cancer. She was given a prescription for Vitamin D, Ergocalciferol, (DRISDOL) 1.25 MG (50000 UNIT) CAPS capsule every week #4 with 0 refills and will follow-up for routine testing of Vitamin D, at least 2-3 times per year to avoid over-replacement.   Pre-diabetes. Caroline Carlson will continue to work on weight loss, exercise, and decreasing simple carbohydrates to help decrease the risk of diabetes. She will continue metFORMIN (GLUCOPHAGE) 500 MG tablet 1 PO QAM and QPM with meals #60 with 0 refills.  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  Caroline Carlson is currently in the action stage of change. As such, her goal is to continue with weight loss efforts. She has agreed to the Category 2 Plan.   She will work on meal planning, intentional eating, increasing her water intake, and will weigh her meat.  Exercise goals: Caroline Carlson will start exercising and walking more.  Behavioral modification strategies: increasing lean protein intake, decreasing simple carbohydrates, increasing vegetables, increasing water intake, decreasing eating out, no skipping meals, meal planning and cooking strategies, keeping healthy foods in the home and planning for success.  Caroline Carlson has agreed to follow-up with our clinic in 2-3 weeks. She was informed of the importance of frequent follow-up visits to maximize her success with intensive lifestyle modifications for her multiple health conditions.   Objective:   Blood pressure 125/76, pulse 95, temperature 98.5 F (36.9 C), height 5\' 5"  (1.651 m), weight 224 lb (101.6 kg), last menstrual period 09/11/2012, SpO2 96 %. Body mass index is 37.28 kg/m.  General: Cooperative, alert, well developed, in no acute distress. HEENT: Conjunctivae and lids unremarkable. Cardiovascular: Regular rhythm.  Lungs: Normal work of breathing. Neurologic: No focal deficits.   Lab Results  Component Value Date   CREATININE 0.81 11/21/2018   BUN 14 11/21/2018   NA 139 11/21/2018   K 3.9 11/21/2018   CL 109 11/21/2018   CO2 22 11/21/2018   Lab Results  Component Value Date   ALT 13 10/20/2013  AST 15 10/20/2013   ALKPHOS 71 10/20/2013   BILITOT 0.4 10/20/2013   Lab Results  Component Value Date   HGBA1C 6.0 (H) 05/04/2019   HGBA1C 6.0 (H) 10/20/2013   Lab Results  Component Value Date   INSULIN 25.3 (H) 05/04/2019   Lab Results  Component Value Date   TSH 0.908 05/04/2019   Lab Results    Component Value Date   CHOL 232 (H) 05/04/2019   HDL 57 05/04/2019   LDLCALC 157 (H) 05/04/2019   TRIG 103 05/04/2019   CHOLHDL 4.6 08/14/2012   Lab Results  Component Value Date   WBC 10.6 (H) 11/21/2018   HGB 13.5 11/21/2018   HCT 41.2 11/21/2018   MCV 92.2 11/21/2018   PLT 356 11/21/2018   No results found for: IRON, TIBC, FERRITIN  Attestation Statements:   Reviewed by clinician on day of visit: allergies, medications, problem list, medical history, surgical history, family history, social history, and previous encounter notes.  Migdalia Dk, am acting as Location manager for [employer], DO   I have reviewed the above documentation for accuracy and completeness, and I agree with the above. Jearld Lesch, DO

## 2019-08-10 ENCOUNTER — Ambulatory Visit (INDEPENDENT_AMBULATORY_CARE_PROVIDER_SITE_OTHER): Payer: BC Managed Care – PPO | Admitting: Orthopaedic Surgery

## 2019-08-10 ENCOUNTER — Other Ambulatory Visit: Payer: Self-pay

## 2019-08-10 ENCOUNTER — Ambulatory Visit (INDEPENDENT_AMBULATORY_CARE_PROVIDER_SITE_OTHER): Payer: BC Managed Care – PPO | Admitting: Bariatrics

## 2019-08-10 ENCOUNTER — Encounter: Payer: Self-pay | Admitting: Orthopaedic Surgery

## 2019-08-10 ENCOUNTER — Encounter (INDEPENDENT_AMBULATORY_CARE_PROVIDER_SITE_OTHER): Payer: Self-pay | Admitting: Bariatrics

## 2019-08-10 VITALS — Ht 65.0 in | Wt 220.0 lb

## 2019-08-10 VITALS — BP 105/70 | HR 83 | Temp 98.6°F | Ht 65.0 in | Wt 224.0 lb

## 2019-08-10 DIAGNOSIS — M542 Cervicalgia: Secondary | ICD-10-CM

## 2019-08-10 DIAGNOSIS — R7303 Prediabetes: Secondary | ICD-10-CM | POA: Diagnosis not present

## 2019-08-10 DIAGNOSIS — M5020 Other cervical disc displacement, unspecified cervical region: Secondary | ICD-10-CM

## 2019-08-10 DIAGNOSIS — F3289 Other specified depressive episodes: Secondary | ICD-10-CM

## 2019-08-10 DIAGNOSIS — Z6837 Body mass index (BMI) 37.0-37.9, adult: Secondary | ICD-10-CM

## 2019-08-10 DIAGNOSIS — E559 Vitamin D deficiency, unspecified: Secondary | ICD-10-CM

## 2019-08-10 NOTE — Progress Notes (Signed)
Office Visit Note   Patient: Caroline Carlson           Date of Birth: January 04, 1981           MRN: [id number] Visit Date: 08/10/2019              Requested by: No referring provider defined for this encounter. PCP: Patient, No Pcp Per   Assessment & Plan: Visit Diagnoses: No diagnosis found.  Plan: We reviewed the MRI scan once again today.  We will order some physical therapy I plan to recheck her in 6 weeks.  She understands currently she has single level disease at C5-6 but does have mild stenosis with flattening of the cord.  No myelopathic symptoms at this time.  Follow-Up Instructions: No follow-ups on file.   Orders:  No orders of the defined types were placed in this encounter.  No orders of the defined types were placed in this encounter.     Procedures: No procedures performed   Clinical Data: No additional findings.   Subjective: Chief Complaint  Patient presents with  . Neck - Pain, Follow-up    HPI 39 year old female returns she states she switched insurance she was getting relief of her neck symptoms with physical therapy and particularly felt that dry needling was helpful.  Patient had disc bulge with central disc protrusion with some flattening of the ventral cord with mild stenosis at C4-5.  Less severe disease at C5-6 and C6-7 without compression.  She states she occasionally has to use muscle relaxants 2 or 3 times a night to get some relief she is using Aleve.  She brought multiple things that therapist had been using on her on [employer] or [place].  These include heat packs roller massager type apparatus etc.  She denies persistent radicular symptoms to her hands.  Review of Systems 14 point update unchanged from 07/06/2019 other than as mentioned above. Of note is prediabetes obesity depression and increased cholesterol history.  Objective: Vital Signs: Ht 5\' 5"  (1.651 m)   Wt (!) 220 lb (99.8 kg)   LMP 09/11/2012   BMI 36.61 kg/m   Physical  Exam Constitutional:      Appearance: She is well-developed.  HENT:     Head: Normocephalic.     Right Ear: External ear normal.     Left Ear: External ear normal.  Eyes:     Pupils: Pupils are equal, round, and reactive to light.  Neck:     Thyroid: No thyromegaly.     Trachea: No tracheal deviation.  Cardiovascular:     Rate and Rhythm: Normal rate.  Pulmonary:     Effort: Pulmonary effort is normal.  Abdominal:     Palpations: Abdomen is soft.  Skin:    General: Skin is warm and dry.  Neurological:     Mental Status: She is alert and oriented to person, place, and time.  Psychiatric:        Behavior: Behavior normal.     Ortho Exam  Specialty Comments:  No specialty comments available.  Imaging: CLINICAL DATA:  Neck pain, spinal stenosis. Additional history provided: Additional history provided by technologist: Patient reports neck pain radiating into bilateral shoulders for a few months, no known injury.  EXAM: MRI CERVICAL SPINE WITHOUT CONTRAST  TECHNIQUE: Multiplanar, multisequence MR imaging of the cervical spine was performed. No intravenous contrast was administered.  COMPARISON:  Cervical spine radiographs 02/04/2019  FINDINGS: Alignment: Reversal of the expected cervical lordosis. No significant spondylolisthesis.  Vertebrae: Vertebral body height is maintained. No marrow edema or suspicious osseous lesion.  Cord: No spinal cord signal abnormality.  Posterior Fossa, vertebral arteries, paraspinal tissues: No signal abnormality identified within included portions of the posterior fossa. Flow voids preserved within the cervical vertebral arteries. Paraspinal soft tissues within normal limits.  Disc levels:  Moderate C5-C6 and C6-C7 disc degeneration. Mild disc degeneration at the remaining levels.  C2-C3: No disc herniation. No significant canal or foraminal stenosis.  C3-C4: No disc herniation. No significant canal or  foraminal stenosis.  C4-C5: Disc bulge. Superimposed small T2 hyperintense central disc protrusion. Uncinate hypertrophy. The protrusion contacts and mildly flattens the ventral spinal cord contributing to overall mild central canal stenosis. Mild relative left neural foraminal narrowing.  C5-C6: Shallow disc bulge with bilateral disc osteophyte ridge/uncinate hypertrophy. No significant spinal canal stenosis. Mild left neural foraminal narrowing.  C6-C7: Shallow disc bulge with uncinate hypertrophy. No significant spinal canal or neural foraminal narrowing.  C7-T1: Mild facet/ligamentum flavum hypertrophy on the left. No disc herniation. No significant canal or foraminal stenosis.  IMPRESSION: Cervical spondylosis as outlined and most notably as follows.  At C4-C5, disc bulge with superimposed small central disc protrusion. Uncinate hypertrophy. The disc protrusion contacts and mildly flattens the ventral spinal cord contributing to overall mild spinal canal stenosis. Mild left neural foraminal narrowing.  At C5-C6, disc bulge with osteophyte ridge and uncinate hypertrophy contribute to mild left neural foraminal narrowing.   Electronically Signed   By: Kellie Simmering DO   On: 02/26/2019 09:23   PMFS History: Patient Active Problem List   Diagnosis Date Noted  . Protrusion of cervical intervertebral disc 07/07/2019  . Prediabetes 05/05/2019  . Elevated cholesterol 05/05/2019  . Vitamin D deficiency 05/05/2019  . Obesity (BMI 30.0-34.9) 08/18/2013  . Follow-up examination, following unspecified surgery 11/16/2012  . Depression 08/14/2012   Past Medical History:  Diagnosis Date  . Anemia   . Anxiety   . Back pain   . Constipation   . Depression   . Fibromyalgia   . History of blood transfusion 06/2002   [facility] - unsure of number of units  . Hyperlipidemia    borderline - diet controlled  . Mental disorder    Currently depressed  . Panic attack   . PONV  (postoperative nausea and vomiting)     Family History  Problem Relation Age of Onset  . Diabetes Mother   . High blood pressure Mother   . Stroke Mother   . Heart disease Mother   . Anxiety disorder Mother   . Heart disease Other   . Hypertension Other     Past Surgical History:  Procedure Laterality Date  . BILATERAL SALPINGECTOMY N/A 09/25/2012   Procedure: BILATERAL SALPINGECTOMY;  Surgeon: Lahoma Crocker, MD;  Location: [facility];  Service: Gynecology;  Laterality: N/A;  . CESAREAN SECTION  2002, 2004, 2006   x 3  . ROBOTIC ASSISTED TOTAL HYSTERECTOMY N/A 09/25/2012   Procedure: ROBOTIC ASSISTED TOTAL HYSTERECTOMY;  Surgeon: Lahoma Crocker, MD;  Location: [facility];  Service: Gynecology;  Laterality: N/A;  . TUBAL LIGATION     Social History   Occupational History  . Occupation: out of work for the last year  Tobacco Use  . Smoking status: Former Smoker    Packs/day: 0.20    Years: 0.50    Pack years: 0.10    Types: Cigarettes  . Smokeless tobacco: Never Used  . Tobacco comment: trying to quit  Substance and Sexual  Activity  . Alcohol use: No  . Drug use: No  . Sexual activity: Yes    Birth control/protection: Surgical

## 2019-08-10 NOTE — Progress Notes (Signed)
Chief Complaint:   OBESITY Caroline Carlson is here to discuss her progress with her obesity treatment plan along with follow-up of her obesity related diagnoses. Caroline Carlson is on the Category 2 Plan and states she is following her eating plan approximately 0% of the time. Caroline Carlson states she is walking 1 mile 20 minutes 2-3 times per week.  Today's visit was #: 7 Starting weight: 223 lbs Starting date: 05/03/2019 Today's weight: 224 lbs Today's date: 08/10/2019 Total lbs lost to date: 0 Total lbs lost since last in-office visit: 0  Interim History: Caroline Carlson's weight remains the same.  Subjective:   Pre-diabetes. Caroline Carlson has a diagnosis of prediabetes based on her elevated HgA1c and was informed this puts her at greater risk of developing diabetes. She continues to work on diet and exercise to decrease her risk of diabetes. She denies nausea or hypoglycemia. Caroline Carlson is taking metformin.  Lab Results  Component Value Date   HGBA1C 6.0 (H) 05/04/2019   Lab Results  Component Value Date   INSULIN 25.3 (H) 05/04/2019   Vitamin D deficiency. Caroline Carlson is taking Vitamin D supplementation.    Ref. Range 05/04/2019 14:43  Vitamin D, 25-Hydroxy Latest Ref Range: 30.0 - 100.0 ng/mL 10.3 (L)   Other depression with emotional eating. Caroline Carlson is struggling with emotional eating and using food for comfort to the extent that it is negatively impacting her health. She has been working on behavior modification techniques to help reduce her emotional eating and has been somewhat successful. She shows no sign of suicidal or homicidal ideations. Caroline Carlson was on Wellbutrin, but stopped due to side effects. She denies stress eating.  Assessment/Plan:   Pre-diabetes. Caroline Carlson will continue to work on weight loss, exercise, and decreasing simple carbohydrates to help decrease the risk of diabetes. She will continue metformin as directed. Comprehensive metabolic panel, CBC with  Differential/Platelet, Hemoglobin A1c, Insulin, random, Lipid Panel With LDL/HDL Ratio, Vitamin B12, T3, T4, free, TSH labs will be checked today.   Vitamin D deficiency. Low Vitamin D level contributes to fatigue and are associated with obesity, breast, and colon cancer. She agrees to continue to take Vitamin D as directed and VITAMIN D 25 Hydroxy (Vit-D Deficiency, Fractures) level will be checked today.  Other depression with emotional eating. Behavior modification techniques were discussed today to help Caroline Carlson deal with her emotional/non-hunger eating behaviors.  Orders and follow up as documented in patient record. Will not resume Wellbutrin at this time.  Class 2 severe obesity with serious comorbidity and body mass index (BMI) of 37.0 to 37.9 in adult, unspecified obesity type ([place]).  Caroline Carlson is currently in the action stage of change. As such, her goal is to continue with weight loss efforts. She has agreed to the Category 2 Plan.   Caroline Carlson will work on meal planning, intentional eating, being more adherent to the plan, not skipping meals, and increasing her water intake.  Exercise goals: Caroline Carlson will continue walking 20 minutes 2-3 times per week.  Behavioral modification strategies: increasing lean protein intake, decreasing simple carbohydrates, increasing vegetables, increasing water intake, decreasing eating out, no skipping meals, meal planning and cooking strategies, keeping healthy foods in the home and planning for success.  Caroline Carlson has agreed to follow-up with our clinic fasting in 2-3 weeks. She was informed of the importance of frequent follow-up visits to maximize her success with intensive lifestyle modifications for her multiple health conditions.   Caroline Carlson was informed we would discuss her lab results at her next  visit unless there is a critical issue that needs to be addressed sooner. Caroline Carlson agreed to keep her next visit at the agreed upon time to  discuss these results.  Objective:   Blood pressure 105/70, pulse 83, temperature 98.6 F (37 C), height 5\' 5"  (1.651 m), weight (!) 224 lb (101.6 kg), last menstrual period 09/11/2012, SpO2 97 %. Body mass index is 37.28 kg/m.  General: Cooperative, alert, well developed, in no acute distress. HEENT: Conjunctivae and lids unremarkable. Cardiovascular: Regular rhythm.  Lungs: Normal work of breathing. Neurologic: No focal deficits.   Lab Results  Component Value Date   CREATININE 0.81 11/21/2018   BUN 14 11/21/2018   NA 139 11/21/2018   K 3.9 11/21/2018   CL 109 11/21/2018   CO2 22 11/21/2018   Lab Results  Component Value Date   ALT 13 10/20/2013   AST 15 10/20/2013   ALKPHOS 71 10/20/2013   BILITOT 0.4 10/20/2013   Lab Results  Component Value Date   HGBA1C 6.0 (H) 05/04/2019   HGBA1C 6.0 (H) 10/20/2013   Lab Results  Component Value Date   INSULIN 25.3 (H) 05/04/2019   Lab Results  Component Value Date   TSH 0.908 05/04/2019   Lab Results  Component Value Date   CHOL 232 (H) 05/04/2019   HDL 57 05/04/2019   LDLCALC 157 (H) 05/04/2019   TRIG 103 05/04/2019   CHOLHDL 4.6 08/14/2012   Lab Results  Component Value Date   WBC 10.6 (H) 11/21/2018   HGB 13.5 11/21/2018   HCT 41.2 11/21/2018   MCV 92.2 11/21/2018   PLT 356 11/21/2018   No results found for: IRON, TIBC, FERRITIN  Attestation Statements:   Reviewed by clinician on day of visit: allergies, medications, problem list, medical history, surgical history, family history, social history, and previous encounter notes.  Migdalia Dk, am acting as Location manager for [employer], DO   I have reviewed the above documentation for accuracy and completeness, and I agree with the above. Jearld Lesch, DO

## 2019-08-11 LAB — LIPID PANEL WITH LDL/HDL RATIO
Cholesterol, Total: 234 mg/dL — ABNORMAL HIGH (ref 100–199)
HDL: 53 mg/dL (ref >39)
LDL Chol Calc (NIH): 167 mg/dL — ABNORMAL HIGH (ref 0–99)
LDL/HDL Ratio: 3.2 ratio (ref 0.0–3.2)
Triglycerides: 80 mg/dL (ref 0–149)
VLDL Cholesterol Cal: 14 mg/dL (ref 5–40)

## 2019-08-11 LAB — HEMOGLOBIN A1C
Est. average glucose Bld gHb Est-mCnc: 131 mg/dL
Hgb A1c MFr Bld: 6.2 % — ABNORMAL HIGH (ref 4.8–5.6)

## 2019-08-11 LAB — COMPREHENSIVE METABOLIC PANEL
ALT: 18 IU/L (ref 0–32)
AST: 13 IU/L (ref 0–40)
Albumin/Globulin Ratio: 1.6 (ref 1.2–2.2)
Albumin: 4.2 g/dL (ref 3.8–4.8)
Alkaline Phosphatase: 78 IU/L (ref 48–121)
BUN/Creatinine Ratio: 16 (ref 9–23)
BUN: 12 mg/dL (ref 6–20)
Bilirubin Total: 0.2 mg/dL (ref 0.0–1.2)
CO2: 20 mmol/L (ref 20–29)
Calcium: 9 mg/dL (ref 8.7–10.2)
Chloride: 106 mmol/L (ref 96–106)
Creatinine, Ser: 0.76 mg/dL (ref 0.57–1.00)
GFR calc Af Amer: 114 mL/min/{1.73_m2} (ref >59)
GFR calc non Af Amer: 99 mL/min/{1.73_m2} (ref >59)
Globulin, Total: 2.6 g/dL (ref 1.5–4.5)
Glucose: 101 mg/dL — ABNORMAL HIGH (ref 65–99)
Potassium: 4.6 mmol/L (ref 3.5–5.2)
Sodium: 139 mmol/L (ref 134–144)
Total Protein: 6.8 g/dL (ref 6.0–8.5)

## 2019-08-11 LAB — CBC WITH DIFFERENTIAL/PLATELET
Basophils Absolute: 0.1 10*3/uL (ref 0.0–0.2)
Basos: 0 %
EOS (ABSOLUTE): 0.1 10*3/uL (ref 0.0–0.4)
Eos: 1 %
Hematocrit: 37.9 % (ref 34.0–46.6)
Hemoglobin: 12.5 g/dL (ref 11.1–15.9)
Immature Grans (Abs): 0 10*3/uL (ref 0.0–0.1)
Immature Granulocytes: 0 %
Lymphocytes Absolute: 3.6 10*3/uL — ABNORMAL HIGH (ref 0.7–3.1)
Lymphs: 31 %
MCH: 29.6 pg (ref 26.6–33.0)
MCHC: 33 g/dL (ref 31.5–35.7)
MCV: 90 fL (ref 79–97)
Monocytes Absolute: 0.8 10*3/uL (ref 0.1–0.9)
Monocytes: 7 %
Neutrophils Absolute: 6.8 10*3/uL (ref 1.4–7.0)
Neutrophils: 61 %
Platelets: 313 10*3/uL (ref 150–450)
RBC: 4.23 x10E6/uL (ref 3.77–5.28)
RDW: 14 % (ref 11.7–15.4)
WBC: 11.3 10*3/uL — ABNORMAL HIGH (ref 3.4–10.8)

## 2019-08-11 LAB — INSULIN, RANDOM: INSULIN: 27.9 u[IU]/mL — ABNORMAL HIGH (ref 2.6–24.9)

## 2019-08-11 LAB — T3: T3, Total: 141 ng/dL (ref 71–180)

## 2019-08-11 LAB — T4, FREE: Free T4: 0.9 ng/dL (ref 0.82–1.77)

## 2019-08-11 LAB — TSH: TSH: 1.64 u[IU]/mL (ref 0.450–4.500)

## 2019-08-11 LAB — VITAMIN D 25 HYDROXY (VIT D DEFICIENCY, FRACTURES): Vit D, 25-Hydroxy: 45.9 ng/mL (ref 30.0–100.0)

## 2019-08-11 LAB — VITAMIN B12: Vitamin B-12: 1368 pg/mL — ABNORMAL HIGH (ref 232–1245)

## 2019-08-21 ENCOUNTER — Other Ambulatory Visit (INDEPENDENT_AMBULATORY_CARE_PROVIDER_SITE_OTHER): Payer: Self-pay | Admitting: Bariatrics

## 2019-08-21 DIAGNOSIS — E559 Vitamin D deficiency, unspecified: Secondary | ICD-10-CM

## 2019-08-30 ENCOUNTER — Ambulatory Visit (INDEPENDENT_AMBULATORY_CARE_PROVIDER_SITE_OTHER): Payer: BC Managed Care – PPO | Admitting: Family Medicine

## 2019-08-30 ENCOUNTER — Other Ambulatory Visit: Payer: Self-pay

## 2019-08-30 ENCOUNTER — Encounter (INDEPENDENT_AMBULATORY_CARE_PROVIDER_SITE_OTHER): Payer: Self-pay | Admitting: Family Medicine

## 2019-08-30 VITALS — BP 101/68 | HR 67 | Temp 98.0°F | Ht 65.0 in | Wt 225.0 lb

## 2019-08-30 DIAGNOSIS — E538 Deficiency of other specified B group vitamins: Secondary | ICD-10-CM

## 2019-08-30 DIAGNOSIS — E66812 Obesity, class 2: Secondary | ICD-10-CM

## 2019-08-30 DIAGNOSIS — E7849 Other hyperlipidemia: Secondary | ICD-10-CM

## 2019-08-30 DIAGNOSIS — Z6837 Body mass index (BMI) 37.0-37.9, adult: Secondary | ICD-10-CM

## 2019-08-30 DIAGNOSIS — R7303 Prediabetes: Secondary | ICD-10-CM | POA: Diagnosis not present

## 2019-08-30 DIAGNOSIS — Z9189 Other specified personal risk factors, not elsewhere classified: Secondary | ICD-10-CM | POA: Diagnosis not present

## 2019-08-30 DIAGNOSIS — E559 Vitamin D deficiency, unspecified: Secondary | ICD-10-CM

## 2019-08-30 MED ORDER — VITAMIN D (ERGOCALCIFEROL) 1.25 MG (50000 UNIT) PO CAPS: 50000.0000 [IU] | ORAL_CAPSULE | ORAL | 0 refills | Status: DC

## 2019-08-31 NOTE — Progress Notes (Signed)
Chief Complaint:   OBESITY Caroline Carlson is here to discuss her progress with her obesity treatment plan along with follow-up of her obesity related diagnoses. Caroline Carlson is on the Category 2 Plan and states she is following her eating plan approximately 90% of the time. Caroline Carlson states she is doing kettle ropes for 60 minutes 7 times per week.  Today's visit was #: 8 Starting weight: 223 lbs Starting date: 05/03/2019 Today's weight: 225 lbs Today's date: 08/30/2019 Total lbs lost to date: 0 Total lbs lost since last in-office visit: 0  Interim History: Caroline Carlson says she is following the plan but not eating all the foods on the plan.  This morning she had protein shake only.  For lunch she had [place] on lettuce wrap with muenster cheese (40 calorie slice cheese).  Dinner was chicken with vegetables, she says.  Subjective:   1. Prediabetes Caroline Carlson has a diagnosis of prediabetes based on her elevated HgA1c and was informed this puts her at greater risk of developing diabetes. She continues to work on diet and exercise to decrease her risk of diabetes. She denies nausea or hypoglycemia.  Caroline Carlson was started on metformin 500 mg twice daily in early July.  She stopped taking it for awhile and recently started taking it once a day.  She feels it is helping with sweets cravings at night.  Her A1c is elevated from prior to 6.2 now.    Lab Results  Component Value Date   HGBA1C 6.2 (H) 08/10/2019   Lab Results  Component Value Date   INSULIN 27.9 (H) 08/10/2019   INSULIN 25.3 (H) 05/04/2019   2. Other hyperlipidemia Caroline Carlson has hyperlipidemia and has been trying to improve her cholesterol levels with intensive lifestyle modification including a low saturated fat diet, exercise and weight loss. She denies any chest pain, claudication or myalgias.  LDL is higher than prior.  I explained to her the meaning of this and all questions were answered.  Lab Results  Component Value  Date   ALT 18 08/10/2019   AST 13 08/10/2019   ALKPHOS 78 08/10/2019   BILITOT 0.2 08/10/2019   Lab Results  Component Value Date   CHOL 234 (H) 08/10/2019   HDL 53 08/10/2019   LDLCALC 167 (H) 08/10/2019   TRIG 80 08/10/2019   CHOLHDL 4.6 08/14/2012   3. Vitamin D deficiency Caroline Carlson's Vitamin D level was 45.9 on 08/10/2019. She is currently taking prescription vitamin D 50,000 IU each week. She denies nausea, vomiting or muscle weakness.    4. Deficiency of vitamin B12 She is not a vegetarian.  She does not have a previous diagnosis of pernicious anemia.  She does not have a history of weight loss surgery.  B12 is slightly elevated, but she is asymptomatic.   Lab Results  Component Value Date   [id number] 4,196 (H) 08/10/2019   5. At risk for diabetes mellitus Caroline Carlson is at higher than average risk for developing diabetes due to her obesity and elevated A1c from prior recent labs.   Assessment/Plan:   1. Prediabetes Discussed labs with patient today.  Caroline Carlson will continue to work on weight loss, exercise, and decreasing simple carbohydrates to help decrease the risk of diabetes.  Continue metformin once daily.  Follow meal plan and eat all the food.  Measure protein!  2. Other hyperlipidemia Discussed labs with patient today.  Cardiovascular risk and specific lipid/LDL goals reviewed.  We discussed several lifestyle modifications today and Sindia will continue to work  on diet, exercise and weight loss efforts. Orders and follow up as documented in patient record.  Decrease saturated and trans fats by eating 93% lean or better only!  Eat all your protein and follow prudent nutritional plan, weight loss.  Counseling Intensive lifestyle modifications are the first line treatment for this issue.  Dietary changes: Increase soluble fiber. Decrease simple carbohydrates.  Exercise changes: Moderate to vigorous-intensity aerobic activity 150 minutes per week if  tolerated.  Lipid-lowering medications: see documented in medical record.  3. Vitamin D deficiency Discussed labs with patient today.  Low Vitamin D level contributes to fatigue and are associated with obesity, breast, and colon cancer. She agrees to continue to take prescription Vitamin D @50 ,000 IU every week and will follow-up for routine testing of Vitamin D, at least 2-3 times per year to avoid over-replacement.  Continue prudent nutritional plan, weight loss.  Educated her about the importance of vitamin D.  - Refill Vitamin D, Ergocalciferol, (DRISDOL) 1.25 MG (50000 UNIT) CAPS capsule; Take 1 capsule (50,000 Units total) by mouth every 7 (seven) days.  Dispense: 4 capsule; Refill: 0  4. Deficiency of vitamin B12 Discussed labs with patient today.  The diagnosis was reviewed with the patient. Counseling provided today, see below. We will continue to monitor. Orders and follow up as documented in patient record.  Continue vitamin B12 supplement, but cut dose in half of what she currently takes.  Continue prudent nutritional plan and weight loss.  Counseling  The body needs vitamin B12: to make red blood cells; to make DNA; and to help the nerves work properly so they can carry messages from the brain to the body.   The main causes of vitamin B12 deficiency include dietary deficiency, digestive diseases, pernicious anemia, and having a surgery in which part of the stomach or small intestine is removed.   Certain medicines can make it harder for the body to absorb vitamin B12. These medicines include: heartburn medications; some antibiotics; some medications used to treat diabetes, gout, and high cholesterol.   In some cases, there are no symptoms of this condition. If the condition leads to anemia or nerve damage, various symptoms can occur, such as weakness or fatigue, shortness of breath, and numbness or tingling in your hands and feet.    Treatment:  o May include taking vitamin B12  supplements.  o Avoid alcohol.  o Eat lots of healthy foods that contain vitamin B12: - Beef, pork, chicken, [place], and organ meats, such as liver.  - Seafood: This includes clams, rainbow trout, salmon, tuna, and haddock. Eggs.  - Cereal and dairy products that are fortified: This means that vitamin B12 has been added to the food.   5. At risk for diabetes mellitus Caroline Carlson was given approximately 14 minutes of diabetes education and counseling today. We discussed intensive lifestyle modifications today with an emphasis on weight loss as well as increasing exercise and decreasing simple carbohydrates in her diet. We also reviewed medication options with an emphasis on risk versus benefit of those discussed.   Repetitive spaced learning was employed today to elicit superior memory formation and behavioral change.  6. Class 2 severe obesity with serious comorbidity and body mass index (BMI) of 37.0 to 37.9 in adult, unspecified obesity type ([facility]) Caroline Carlson is currently in the action stage of change. As such, her goal is to continue with weight loss efforts. She has agreed to the Category 2 Plan.  She was given additional handouts for the Category 2  plan.   Exercise goals: As is.  Behavioral modification strategies: increasing lean protein intake, decreasing simple carbohydrates, decreasing liquid calories, no skipping meals, meal planning and cooking strategies and planning for success.  Caroline Carlson has agreed to follow-up with our clinic in 2 weeks. She was informed of the importance of frequent follow-up visits to maximize her success with intensive lifestyle modifications for her multiple health conditions.   Objective:   Blood pressure 101/68, pulse 67, temperature 98 F (36.7 C), height 5\' 5"  (1.651 m), weight 225 lb (102.1 kg), last menstrual period 09/11/2012, SpO2 98 %. Body mass index is 37.44 kg/m.  General: Cooperative, alert, well developed, in no acute distress. HEENT:  Conjunctivae and lids unremarkable. Cardiovascular: Regular rhythm.  Lungs: Normal work of breathing. Neurologic: No focal deficits.   Lab Results  Component Value Date   CREATININE 0.76 08/10/2019   BUN 12 08/10/2019   NA 139 08/10/2019   K 4.6 08/10/2019   CL 106 08/10/2019   CO2 20 08/10/2019   Lab Results  Component Value Date   ALT 18 08/10/2019   AST 13 08/10/2019   ALKPHOS 78 08/10/2019   BILITOT 0.2 08/10/2019   Lab Results  Component Value Date   HGBA1C 6.2 (H) 08/10/2019   HGBA1C 6.0 (H) 05/04/2019   HGBA1C 6.0 (H) 10/20/2013   Lab Results  Component Value Date   INSULIN 27.9 (H) 08/10/2019   INSULIN 25.3 (H) 05/04/2019   Lab Results  Component Value Date   TSH 1.640 08/10/2019   Lab Results  Component Value Date   CHOL 234 (H) 08/10/2019   HDL 53 08/10/2019   LDLCALC 167 (H) 08/10/2019   TRIG 80 08/10/2019   CHOLHDL 4.6 08/14/2012   Lab Results  Component Value Date   WBC 11.3 (H) 08/10/2019   HGB 12.5 08/10/2019   HCT 37.9 08/10/2019   MCV 90 08/10/2019   PLT 313 08/10/2019   Attestation Statements:   Reviewed by clinician on day of visit: allergies, medications, problem list, medical history, surgical history, family history, social history, and previous encounter notes.  I, Water quality scientist, CMA, am acting as Location manager for [employer], DO.  I have reviewed the above documentation for accuracy and completeness, and I agree with the above. Mellody Dance, DO

## 2019-09-07 ENCOUNTER — Ambulatory Visit: Payer: BC Managed Care – PPO | Attending: Orthopedic Surgery | Admitting: Physical Therapy

## 2019-09-13 ENCOUNTER — Ambulatory Visit (INDEPENDENT_AMBULATORY_CARE_PROVIDER_SITE_OTHER): Payer: BC Managed Care – PPO | Admitting: Family Medicine

## 2020-07-25 ENCOUNTER — Ambulatory Visit: Payer: Medicaid Other | Admitting: Allergy

## 2020-08-31 ENCOUNTER — Ambulatory Visit: Payer: Medicaid Other | Admitting: Allergy

## 2020-09-05 ENCOUNTER — Ambulatory Visit: Payer: Medicaid Other | Admitting: Allergy

## 2020-09-05 NOTE — Progress Notes (Deleted)
New Caroline Carlson Note  RE: Caroline Carlson MRN: [id number] DOB: 25-Sep-1980 Date of Office Visit: 09/05/2020  Consult requested by: Lin Landsman, MD Primary care provider: Patient, No Pcp Per (Inactive)  Chief Complaint: No chief complaint on file.  History of Present Illness: I had the pleasure of seeing Caroline Carlson for initial evaluation at the [facility] on 09/05/2020. She is a 40 y.o. female, who is referred here by Caroline Carlson, No Pcp Per (Inactive) for the evaluation of ***.  ***  Assessment and Plan: Shauntel is a 40 y.o. female with: No problem-specific Assessment & Plan notes found for this encounter.  No follow-ups on file.  No orders of the defined types were placed in this encounter.  Lab Orders  No laboratory test(s) ordered today    Other allergy screening: Asthma: {Blank single:19197::"yes","no"} Rhino conjunctivitis: {Blank single:19197::"yes","no"} Food allergy: {Blank single:19197::"yes","no"} Medication allergy: {Blank single:19197::"yes","no"} Hymenoptera allergy: {Blank single:19197::"yes","no"} Urticaria: {Blank single:19197::"yes","no"} Eczema:{Blank single:19197::"yes","no"} History of recurrent infections suggestive of immunodeficency: {Blank single:19197::"yes","no"}  Diagnostics: Spirometry:  Tracings reviewed. Her effort: {Blank single:19197::"Good reproducible efforts.","It was hard to get consistent efforts and there is a question as to whether this reflects a maximal maneuver.","Poor effort, data can not be interpreted."} FVC: ***L FEV1: ***L, ***% predicted FEV1/FVC ratio: ***% Interpretation: {Blank single:19197::"Spirometry consistent with mild obstructive disease","Spirometry consistent with moderate obstructive disease","Spirometry consistent with severe obstructive disease","Spirometry consistent with possible restrictive disease","Spirometry consistent with mixed obstructive and restrictive disease","Spirometry  uninterpretable due to technique","Spirometry consistent with normal pattern","No overt abnormalities noted given today's efforts"}.  Please see scanned spirometry results for details.  Skin Testing: {Blank single:19197::"Select foods","Environmental allergy panel","Environmental allergy panel and select foods","Food allergy panel","None","Deferred due to recent antihistamines use"}. *** Results discussed with Caroline Carlson/family.   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   Prediabetes 05/05/2019   Elevated cholesterol 05/05/2019   Vitamin D deficiency 05/05/2019   Obesity (BMI 30.0-34.9) 08/18/2013   Follow-up examination, following unspecified surgery 11/16/2012   Depression 08/14/2012   Past Medical History:  Diagnosis Date   Anemia    Anxiety    Back pain    Constipation    Depression    Fibromyalgia    History of blood transfusion 06/2002   Caroline Carlson - unsure of number of units   Hyperlipidemia    borderline - diet controlled   Mental disorder    Currently depressed   Panic attack    PONV (postoperative nausea and vomiting)    Past Surgical History: Past Surgical History:  Procedure Laterality Date   BILATERAL SALPINGECTOMY N/A 09/25/2012   Procedure: BILATERAL SALPINGECTOMY;  Surgeon: Lahoma Crocker, MD;  Location: [facility];  Service: Gynecology;  Laterality: N/A;   CESAREAN SECTION  2002, 2004, 2006   x 3   ROBOTIC ASSISTED TOTAL HYSTERECTOMY N/A 09/25/2012   Procedure: ROBOTIC ASSISTED TOTAL HYSTERECTOMY;  Surgeon: Lahoma Crocker, MD;  Location: [facility];  Service: Gynecology;  Laterality: N/A;   TUBAL LIGATION     Medication List:  Current Outpatient Medications  Medication Sig Dispense Refill   acetaminophen-codeine (TYLENOL #3) 300-30 MG tablet Take 1 tablet by mouth every 8 (eight) hours as needed for moderate pain. 30 tablet 0   acetaminophen-codeine (TYLENOL #3) 300-30 MG  tablet Take 1 tablet by mouth every 8 (eight) hours as needed for moderate pain. 30 tablet 0   DULoxetine (CYMBALTA) 30 MG capsule Take 1 capsule (30 mg total) by  mouth daily after supper. For 7 nights then take 2 capsules daily at night 60 capsule 1   ibuprofen (ADVIL) 600 MG tablet Take 1 tablet (600 mg total) by mouth every 6 (six) hours as needed. 30 tablet 0   metFORMIN (GLUCOPHAGE) 500 MG tablet Take 1 tablet (500 mg total) by mouth 2 (two) times daily with a meal. 60 tablet 0   methocarbamol (ROBAXIN) 500 MG tablet Take 1 tablet (500 mg total) by mouth every 8 (eight) hours as needed for muscle spasms. 30 tablet 0   methocarbamol (ROBAXIN) 500 MG tablet 1 po q 8hrs prn 30 tablet 0   Vitamin D, Ergocalciferol, (DRISDOL) 1.25 MG (50000 UNIT) CAPS capsule Take 1 capsule (50,000 Units total) by mouth every 7 (seven) days. 4 capsule 0   No current facility-administered medications for this visit.   Allergies: No Known Allergies Social History: Social History   Socioeconomic History   Marital status: Married    Spouse name: Not on file   Number of children: Not on file   Years of education: Not on file   Highest education level: Not on file  Occupational History   Occupation: out of work for the last year  Tobacco Use   Smoking status: Former    Packs/day: 0.20    Years: 0.50    Pack years: 0.10    Types: Cigarettes   Smokeless tobacco: Never   Tobacco comments:    trying to quit  Substance and Sexual Activity   Alcohol use: No   Drug use: No   Sexual activity: Yes    Birth control/protection: Surgical  Other Topics Concern   Not on file  Social History Narrative   Not on file   Social Determinants of Health   Financial Resource Strain: Not on file  Food Insecurity: Not on file  Transportation Needs: Not on file  Physical Activity: Not on file  Stress: Not on file  Social Connections: Not on file   Lives in a ***. Smoking: *** Occupation: ***  Environmental  HistoryFreight forwarder in the house: Estate agent in the family room: {Blank single:19197::"yes","no"} Carpet in the bedroom: {Blank single:19197::"yes","no"} Heating: {Blank single:19197::"electric","gas","heat pump"} Cooling: {Blank single:19197::"central","window","heat pump"} Pet: {Blank single:19197::"yes ***","no"}  Family History: Family History  Problem Relation Age of Onset   Diabetes Mother    High blood pressure Mother    Stroke Mother    Heart disease Mother    Anxiety disorder Mother    Heart disease Other    Hypertension Other    Problem                               Relation Asthma                                   *** Eczema                                *** Food allergy                          *** Allergic rhino conjunctivitis     ***  Review of Systems  Constitutional:  Negative for appetite change, chills, fever and unexpected weight change.  HENT:  Negative for congestion and rhinorrhea.  Eyes:  Negative for itching.  Respiratory:  Negative for cough, chest tightness, shortness of breath and wheezing.   Cardiovascular:  Negative for chest pain.  Gastrointestinal:  Negative for abdominal pain.  Genitourinary:  Negative for difficulty urinating.  Skin:  Negative for rash.  Neurological:  Negative for headaches.   Objective: LMP 09/11/2012  There is no height or weight on file to calculate BMI. Physical Exam Vitals and nursing note reviewed.  Constitutional:      Appearance: Normal appearance. She is well-developed.  HENT:     Head: Normocephalic and atraumatic.     Right Ear: Tympanic membrane and external ear normal.     Left Ear: Tympanic membrane and external ear normal.     Nose: Nose normal.     Mouth/Throat:     Mouth: Mucous membranes are moist.     Pharynx: Oropharynx is clear.  Eyes:     Conjunctiva/sclera: Conjunctivae normal.  Cardiovascular:     Rate and Rhythm: Normal rate and regular rhythm.      Heart sounds: Normal heart sounds. No murmur heard.   No friction rub. No gallop.  Pulmonary:     Effort: Pulmonary effort is normal.     Breath sounds: Normal breath sounds. No wheezing, rhonchi or rales.  Musculoskeletal:     Cervical back: Neck supple.  Skin:    General: Skin is warm.     Findings: No rash.  Neurological:     Mental Status: She is alert and oriented to person, place, and time.  Psychiatric:        Behavior: Behavior normal.   The plan was reviewed with the Caroline Carlson/family, and all questions/concerned were addressed.  It was my pleasure to see Toshiko today and participate in her care. Please feel free to contact me with any questions or concerns.  Sincerely,  Rexene Alberts, DO Allergy & Immunology  [facility] office: [place] office: [phone]

## 2020-10-11 ENCOUNTER — Ambulatory Visit: Payer: BC Managed Care – PPO | Admitting: Internal Medicine

## 2020-10-26 ENCOUNTER — Ambulatory Visit: Payer: Medicaid Other | Admitting: Nurse Practitioner

## 2020-11-15 ENCOUNTER — Other Ambulatory Visit: Payer: Self-pay

## 2020-11-15 ENCOUNTER — Encounter: Payer: Self-pay | Admitting: Internal Medicine

## 2020-11-15 ENCOUNTER — Ambulatory Visit (INDEPENDENT_AMBULATORY_CARE_PROVIDER_SITE_OTHER): Payer: Medicare Other | Admitting: Internal Medicine

## 2020-11-15 VITALS — BP 128/76 | HR 65 | Resp 18 | Ht 65.0 in | Wt 218.8 lb

## 2020-11-15 DIAGNOSIS — M5442 Lumbago with sciatica, left side: Secondary | ICD-10-CM | POA: Diagnosis not present

## 2020-11-15 DIAGNOSIS — J3089 Other allergic rhinitis: Secondary | ICD-10-CM

## 2020-11-15 DIAGNOSIS — E7800 Pure hypercholesterolemia, unspecified: Secondary | ICD-10-CM | POA: Diagnosis not present

## 2020-11-15 DIAGNOSIS — E559 Vitamin D deficiency, unspecified: Secondary | ICD-10-CM | POA: Diagnosis not present

## 2020-11-15 DIAGNOSIS — R7303 Prediabetes: Secondary | ICD-10-CM

## 2020-11-15 LAB — CBC
HCT: 39.9 % (ref 36.0–46.0)
Hemoglobin: 13.3 g/dL (ref 12.0–15.0)
MCHC: 33.3 g/dL (ref 30.0–36.0)
MCV: 89.6 fl (ref 78.0–100.0)
Platelets: 356 10*3/uL (ref 150.0–400.0)
RBC: 4.45 Mil/uL (ref 3.87–5.11)
RDW: 14.5 % (ref 11.5–15.5)
WBC: 8.9 10*3/uL (ref 4.0–10.5)

## 2020-11-15 LAB — HEMOGLOBIN A1C: Hgb A1c MFr Bld: 6.1 % (ref 4.6–6.5)

## 2020-11-15 LAB — LIPID PANEL
Cholesterol: 228 mg/dL — ABNORMAL HIGH (ref 0–200)
HDL: 53.4 mg/dL (ref >39.00)
LDL Cholesterol: 153 mg/dL — ABNORMAL HIGH (ref 0–99)
NonHDL: 174.32
Total CHOL/HDL Ratio: 4
Triglycerides: 106 mg/dL (ref 0.0–149.0)
VLDL: 21.2 mg/dL (ref 0.0–40.0)

## 2020-11-15 LAB — COMPREHENSIVE METABOLIC PANEL
ALT: 20 U/L (ref 0–35)
AST: 14 U/L (ref 0–37)
Albumin: 4.2 g/dL (ref 3.5–5.2)
Alkaline Phosphatase: 72 U/L (ref 39–117)
BUN: 13 mg/dL (ref 6–23)
CO2: 24 mEq/L (ref 19–32)
Calcium: 9.2 mg/dL (ref 8.4–10.5)
Chloride: 107 mEq/L (ref 96–112)
Creatinine, Ser: 0.85 mg/dL (ref 0.40–1.20)
GFR: 85.52 mL/min (ref >60.00)
Glucose, Bld: 103 mg/dL — ABNORMAL HIGH (ref 70–99)
Potassium: 4.2 mEq/L (ref 3.5–5.1)
Sodium: 140 mEq/L (ref 135–145)
Total Bilirubin: 0.3 mg/dL (ref 0.2–1.2)
Total Protein: 7.1 g/dL (ref 6.0–8.3)

## 2020-11-15 LAB — VITAMIN B12: Vitamin B-12: 1128 pg/mL — ABNORMAL HIGH (ref 211–911)

## 2020-11-15 LAB — TSH: TSH: 1.16 u[IU]/mL (ref 0.35–5.50)

## 2020-11-15 LAB — VITAMIN D 25 HYDROXY (VIT D DEFICIENCY, FRACTURES): VITD: 15.98 ng/mL — ABNORMAL LOW (ref 30.00–100.00)

## 2020-11-15 MED ORDER — METHYLPREDNISOLONE ACETATE 40 MG/ML IJ SUSP
40.0000 mg | Freq: Once | INTRAMUSCULAR | Status: AC
  Administered 2020-11-15: 40 mg via INTRAMUSCULAR

## 2020-11-15 MED ORDER — KETOROLAC TROMETHAMINE 30 MG/ML IJ SOLN
30.0000 mg | Freq: Once | INTRAMUSCULAR | Status: AC
  Administered 2020-11-15: 30 mg via INTRAMUSCULAR

## 2020-11-15 MED ORDER — MONTELUKAST SODIUM 10 MG PO TABS: 10.0000 mg | ORAL_TABLET | Freq: Every day | ORAL | 3 refills | Status: DC

## 2020-11-15 NOTE — Progress Notes (Signed)
   Subjective:   Patient ID: Caroline Carlson, female    DOB: [birth year], 40 y.o.   MRN: [id number]  HPI The patient is a new 40 YO female coming in for back pain and other medical conditions.   PMH, [facility], social history reviewed and updated  Review of Systems  Constitutional:  Positive for activity change. Negative for appetite change, chills, fatigue, fever and unexpected weight change.  HENT:  Positive for congestion.   Respiratory: Negative.    Cardiovascular: Negative.   Gastrointestinal: Negative.   Musculoskeletal:  Positive for arthralgias, back pain and myalgias. Negative for gait problem and joint swelling.  Skin: Negative.   Neurological: Negative.    Objective:  Physical Exam Constitutional:      Appearance: She is well-developed.  HENT:     Head: Normocephalic and atraumatic.  Cardiovascular:     Rate and Rhythm: Normal rate and regular rhythm.  Pulmonary:     Effort: Pulmonary effort is normal. No respiratory distress.     Breath sounds: Normal breath sounds. No wheezing or rales.  Abdominal:     General: Bowel sounds are normal. There is no distension.     Palpations: Abdomen is soft.     Tenderness: There is no abdominal tenderness. There is no rebound.  Musculoskeletal:        General: Tenderness present.     Cervical back: Normal range of motion.  Skin:    General: Skin is warm and dry.  Neurological:     Mental Status: She is alert and oriented to person, place, and time.     Coordination: Coordination normal.    Vitals:   11/15/20 1105  BP: 128/76  Pulse: 65  Resp: 18  SpO2: 99%  Weight: 218 lb 12.8 oz (99.2 kg)  Height: 5\' 5"  (1.651 m)    This visit occurred during the SARS-CoV-2 public health emergency.  Safety protocols were in place, including screening questions prior to the visit, additional usage of staff PPE, and extensive cleaning of exam room while observing appropriate contact time as indicated for disinfecting solutions.    Assessment & Plan:  Toradol 30 mg IM and depo-medrol 40 mg IM given at visit

## 2020-11-15 NOTE — Patient Instructions (Addendum)
We will check the labs today.   We have sent in the singulair which is the allergy medication to try taking daily for 1 month and then stop.   We have given you the shots for the back to help the pain go away faster.

## 2020-11-17 DIAGNOSIS — M545 Low back pain, unspecified: Secondary | ICD-10-CM | POA: Insufficient documentation

## 2020-11-17 DIAGNOSIS — J309 Allergic rhinitis, unspecified: Secondary | ICD-10-CM | POA: Insufficient documentation

## 2020-11-17 DIAGNOSIS — M5442 Lumbago with sciatica, left side: Secondary | ICD-10-CM | POA: Insufficient documentation

## 2020-11-17 NOTE — Assessment & Plan Note (Signed)
Rx singulair, has tried otc without full relief.

## 2020-11-17 NOTE — Assessment & Plan Note (Signed)
Given depo-medrol 40 mg IM and toradol 30 mg IM at visit to help assist in recovery.

## 2020-11-17 NOTE — Assessment & Plan Note (Signed)
Checking lipid panel and adjust as needed.  

## 2020-11-17 NOTE — Assessment & Plan Note (Signed)
Checking HgA1c and adjust as needed.  

## 2020-11-17 NOTE — Assessment & Plan Note (Signed)
Checking vitamin D level for follow up. Previously has taken high does replacement due to low levels.

## 2020-11-20 ENCOUNTER — Other Ambulatory Visit: Payer: Self-pay | Admitting: Internal Medicine

## 2020-11-20 MED ORDER — VITAMIN D (ERGOCALCIFEROL) 1.25 MG (50000 UNIT) PO CAPS: 50000.0000 [IU] | ORAL_CAPSULE | ORAL | 0 refills | Status: DC

## 2021-01-30 ENCOUNTER — Ambulatory Visit (INDEPENDENT_AMBULATORY_CARE_PROVIDER_SITE_OTHER): Payer: 59 | Admitting: Internal Medicine

## 2021-01-30 ENCOUNTER — Encounter: Payer: Self-pay | Admitting: Internal Medicine

## 2021-01-30 ENCOUNTER — Other Ambulatory Visit: Payer: Self-pay

## 2021-01-30 VITALS — BP 128/88 | HR 82 | Resp 18 | Ht 65.0 in | Wt 214.0 lb

## 2021-01-30 DIAGNOSIS — M5442 Lumbago with sciatica, left side: Secondary | ICD-10-CM

## 2021-01-30 DIAGNOSIS — L709 Acne, unspecified: Secondary | ICD-10-CM

## 2021-01-30 DIAGNOSIS — R7303 Prediabetes: Secondary | ICD-10-CM | POA: Diagnosis not present

## 2021-01-30 DIAGNOSIS — M5020 Other cervical disc displacement, unspecified cervical region: Secondary | ICD-10-CM | POA: Diagnosis not present

## 2021-01-30 MED ORDER — FINACEA 15 % EX FOAM: 0.1500 g | Freq: Two times a day (BID) | CUTANEOUS | 6 refills | Status: DC

## 2021-01-30 MED ORDER — TOPIRAMATE 50 MG PO TABS: 50.0000 mg | ORAL_TABLET | Freq: Every day | ORAL | 1 refills | Status: DC

## 2021-01-30 NOTE — Patient Instructions (Addendum)
We have sent in topiramate to take 1 pill daily at night time. Let us know in 2 weeks how this is going.  We have sent in the acne foam to use twice a day and let us know how this is doing.  We will get you in with physical therapy and with the back/spine specialist.

## 2021-01-30 NOTE — Progress Notes (Signed)
° °  Subjective:   Patient ID: Caroline Carlson, female    DOB: 07-Dec-1980, 41 y.o.   MRN: [id number]  HPI The patient is a 41 YO female coming in for acne on face and other concerns.   Review of Systems  Constitutional: Negative.   HENT: Negative.    Eyes: Negative.   Respiratory:  Negative for cough, chest tightness and shortness of breath.   Cardiovascular:  Negative for chest pain, palpitations and leg swelling.  Gastrointestinal:  Negative for abdominal distention, abdominal pain, constipation, diarrhea, nausea and vomiting.  Musculoskeletal:  Positive for back pain and neck pain.  Skin:  Positive for rash.  Neurological:  Positive for headaches.  Psychiatric/Behavioral: Negative.     Objective:  Physical Exam Constitutional:      Appearance: She is well-developed.  HENT:     Head: Normocephalic and atraumatic.     Comments: Acne on face Cardiovascular:     Rate and Rhythm: Normal rate and regular rhythm.  Pulmonary:     Effort: Pulmonary effort is normal. No respiratory distress.     Breath sounds: Normal breath sounds. No wheezing or rales.  Abdominal:     General: Bowel sounds are normal. There is no distension.     Palpations: Abdomen is soft.     Tenderness: There is no abdominal tenderness. There is no rebound.  Musculoskeletal:        General: Tenderness present.     Cervical back: Normal range of motion.  Skin:    General: Skin is warm and dry.  Neurological:     Mental Status: She is alert and oriented to person, place, and time.     Coordination: Coordination normal.    Vitals:   01/30/21 0825  BP: 128/88  Pulse: 82  Resp: 18  SpO2: 96%  Weight: 214 lb (97.1 kg)  Height: 5\' 5"  (1.651 m)    This visit occurred during the SARS-CoV-2 public health emergency.  Safety protocols were in place, including screening questions prior to the visit, additional usage of staff PPE, and extensive cleaning of exam room while observing appropriate contact time as  indicated for disinfecting solutions.   Assessment & Plan:

## 2021-01-31 DIAGNOSIS — L709 Acne, unspecified: Secondary | ICD-10-CM | POA: Insufficient documentation

## 2021-01-31 NOTE — Assessment & Plan Note (Signed)
Rx finacea foam as this was highly effective several years ago with flare. 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

## 2021-01-31 NOTE — Assessment & Plan Note (Signed)
Referral to PT for pain management as well as neurosurgery for longer term counseling about impact from job and strategies to avoid long term injury.

## 2021-01-31 NOTE — Assessment & Plan Note (Signed)
BMI 75.7 and complicated by pre-diabetes and depression and back pain/neck pain. Rx topiramate to help with weight loss today.

## 2021-01-31 NOTE — Assessment & Plan Note (Signed)
Referral to PT for pain management as well as neurosurgery for long term strategy. She is in pain after working and for several days after. We talked about consideration of different job if this is impacting her long term health to avoid long term injury. Taking ibuprofen otc and this is helping with pain.

## 2021-01-31 NOTE — Assessment & Plan Note (Signed)
Recent HgA1c stable, will continue monitoring every 6 months.

## 2021-02-02 ENCOUNTER — Ambulatory Visit: Payer: Medicare Other | Admitting: Internal Medicine

## 2021-02-05 ENCOUNTER — Telehealth: Payer: Self-pay | Admitting: Internal Medicine

## 2021-02-05 ENCOUNTER — Other Ambulatory Visit: Payer: Self-pay | Admitting: Neurosurgery

## 2021-02-05 DIAGNOSIS — M5416 Radiculopathy, lumbar region: Secondary | ICD-10-CM

## 2021-02-05 DIAGNOSIS — M5412 Radiculopathy, cervical region: Secondary | ICD-10-CM

## 2021-02-05 NOTE — Telephone Encounter (Signed)
Patient calling in  Patient says the dark spots on her face are returning & wants to know if provider will send her rx in for RETINOL.Marland Kitchen says shes previously used it before from her dermatologist  Pharmacy  [id number] - [place], [place] - [address]  Phone:  [phone] Fax:  [phone]

## 2021-02-05 NOTE — Telephone Encounter (Signed)
See below

## 2021-02-08 MED ORDER — RETINOL MOLECULAR FILM 0.3 % OIL: TOPICAL_OIL | 11 refills | Status: DC

## 2021-02-08 NOTE — Telephone Encounter (Signed)
I can send in but this is typically not covered by insurance.

## 2021-02-12 ENCOUNTER — Telehealth: Payer: Self-pay

## 2021-02-12 ENCOUNTER — Ambulatory Visit
Admission: RE | Admit: 2021-02-12 | Discharge: 2021-02-12 | Disposition: A | Discharge status: Home / Self Care | Payer: 59 | Source: Ambulatory Visit | Attending: Neurosurgery | Admitting: Neurosurgery

## 2021-02-12 DIAGNOSIS — M542 Cervicalgia: Secondary | ICD-10-CM | POA: Diagnosis not present

## 2021-02-12 DIAGNOSIS — M5412 Radiculopathy, cervical region: Secondary | ICD-10-CM

## 2021-02-12 DIAGNOSIS — M5416 Radiculopathy, lumbar region: Secondary | ICD-10-CM

## 2021-02-12 IMAGING — MR MR LUMBAR SPINE W/O CM
5 series · 48 of 48 positions shown · non-contrast
Comparison: None.

CLINICAL DATA: Chronic low back pain with bilateral leg pain
worsening in the last 3-7 years

EXAM:
MRI LUMBAR SPINE WITHOUT CONTRAST
TECHNIQUE: Multiplanar, multisequence MR imaging of the lumbar spine was
performed. No intravenous contrast was administered.

[Series 6: T2 · sagittal · 4.0mm · 0.73mm/px · 4 of 12 slices shown (1 of 2)]
[im 1/12]
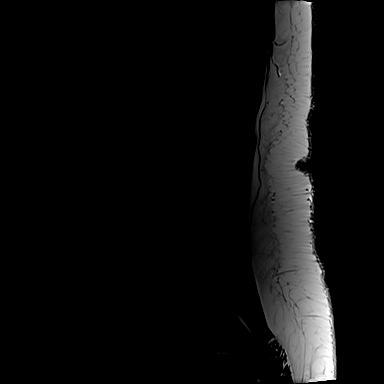
[im 4/12]
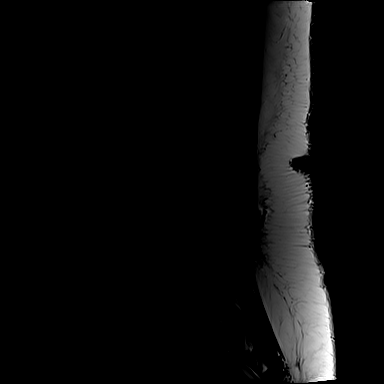
[im 8/12]
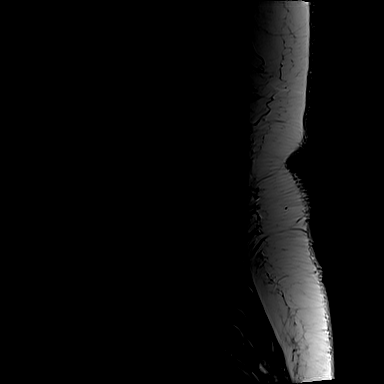
[im 12/12]
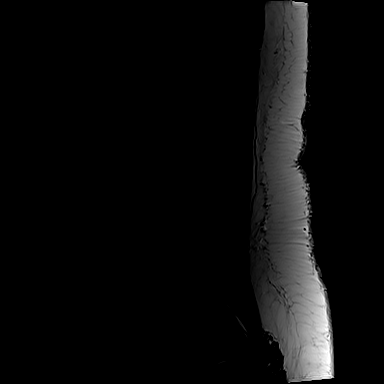

[Series 7: T1 · sagittal · 4.0mm · 0.73mm/px · 5 of 12 slices shown (1 of 2)]
[im 1/12]
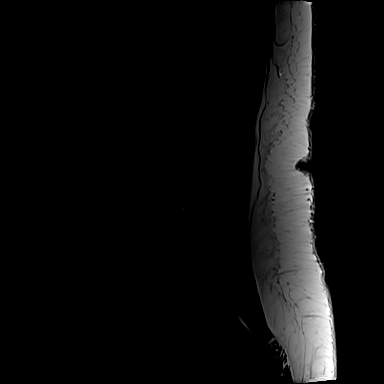
[im 3/12]
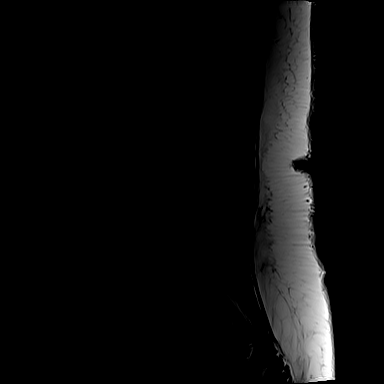
[im 6/12]
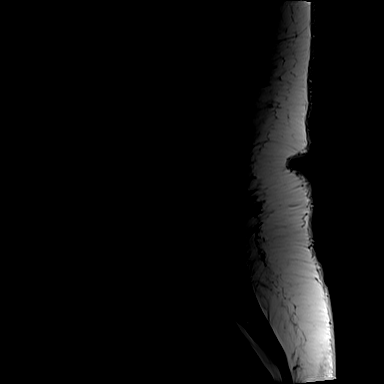
[im 9/12]
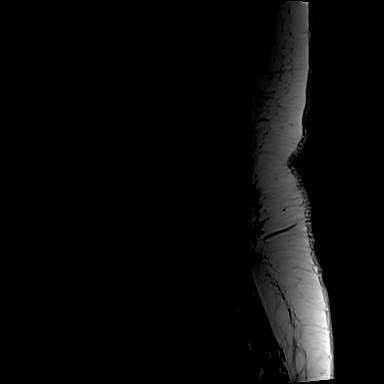
[im 12/12]
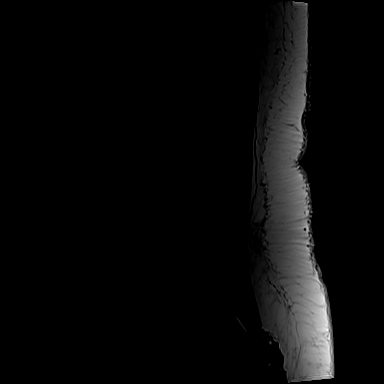

[Series 8: STIR · sagittal · 4.0mm · 0.88mm/px · 5 of 12 slices shown]
[im 1/12]
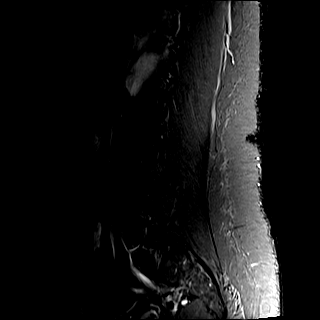
[im 3/12]
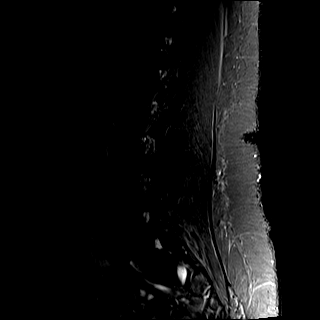
[im 6/12]
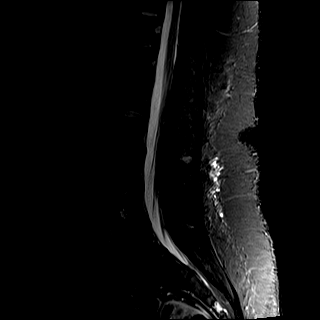
[im 9/12]
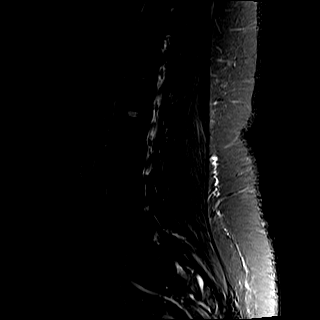
[im 12/12]
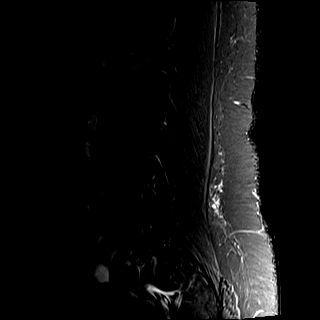

[Series 9: T1 · axial · 4.0mm · 0.78mm/px · z∈[-496,-291]mm · 17 of 40 slices shown (2 of 2)]
[im 1/40]
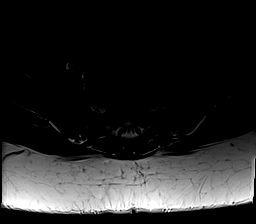
[im 3/40]
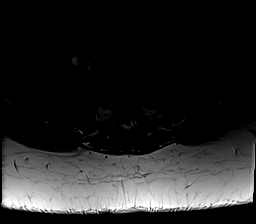
[im 5/40]
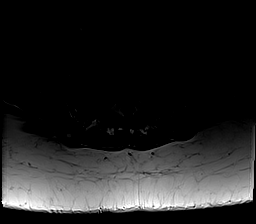
[im 8/40]
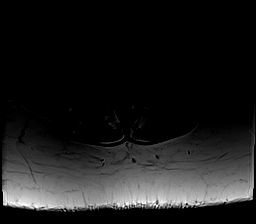
[im 10/40]
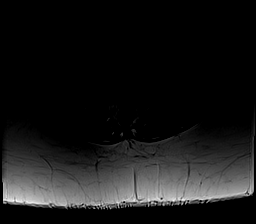
[im 13/40]
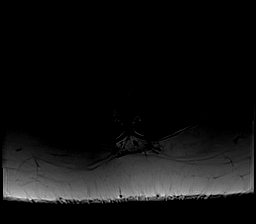
[im 15/40]
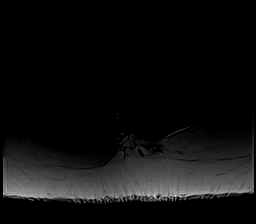
[im 18/40]
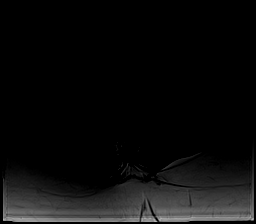
[im 20/40]
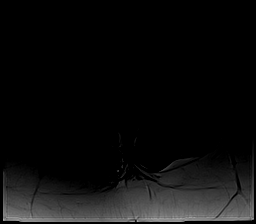
[im 22/40]
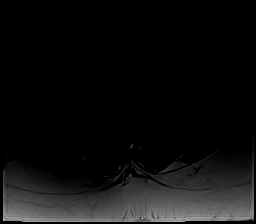
[im 25/40]
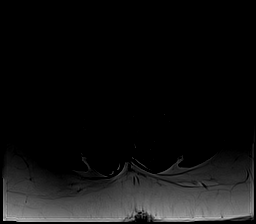
[im 27/40]
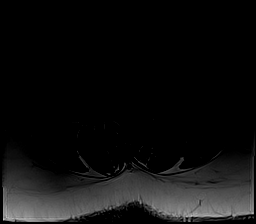
[im 30/40]
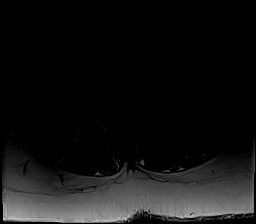
[im 32/40]
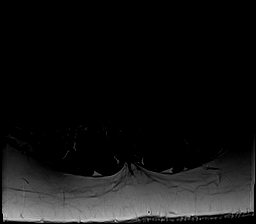
[im 35/40]
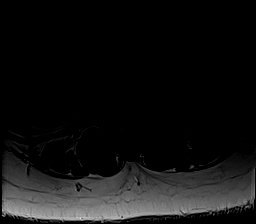
[im 37/40]
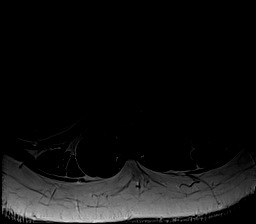
[im 40/40]
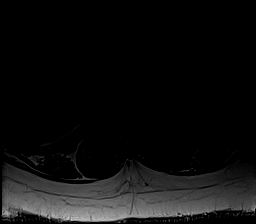

[Series 10: T2 · axial · 4.0mm · 0.78mm/px · z∈[-497,-290]mm · 17 of 40 slices shown (2 of 2)]
[im 1/40]
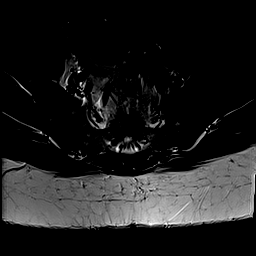
[im 3/40]
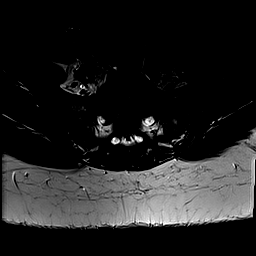
[im 5/40]
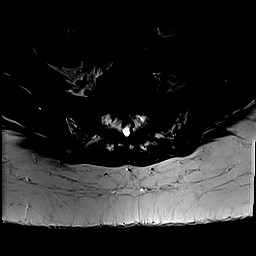
[im 8/40]
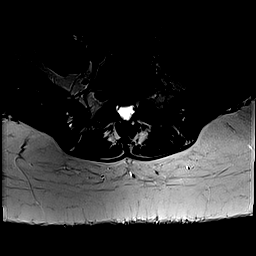
[im 10/40]
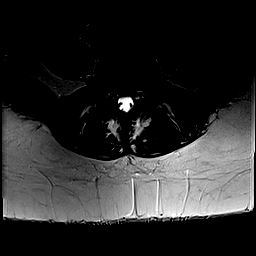
[im 13/40]
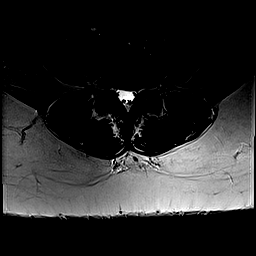
[im 15/40]
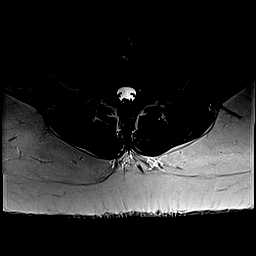
[im 18/40]
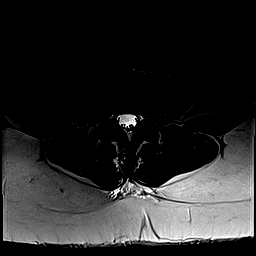
[im 20/40]
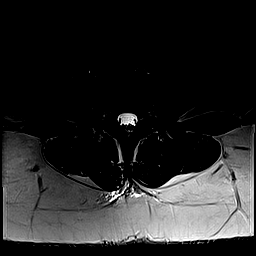
[im 22/40]
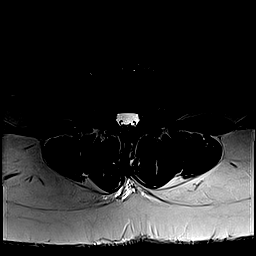
[im 25/40]
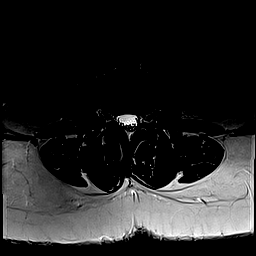
[im 27/40]
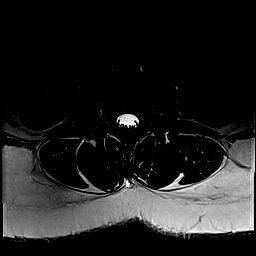
[im 30/40]
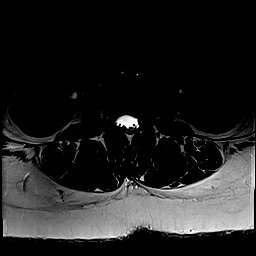
[im 32/40]
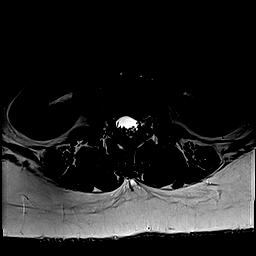
[im 35/40]
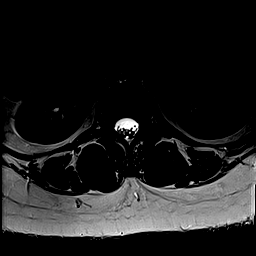
[im 37/40]
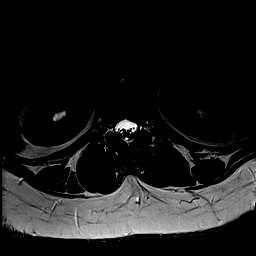
[im 40/40]
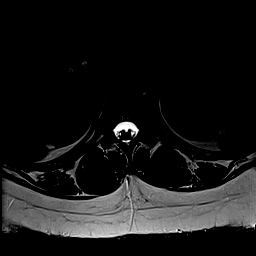

[48 of 48 positions shown; findings below may reference images not displayed]

FINDINGS: Segmentation:  5 lumbar type vertebrae

Alignment:  Physiologic.

Vertebrae:  No fracture, evidence of discitis, or bone lesion.

Conus medullaris and cauda equina: Conus extends to the L1 level.
Conus and cauda equina appear normal.

Paraspinal and other soft tissues: Negative for perispinal mass or
inflammation

Disc levels:

T12- L1: Unremarkable.

L1-L2: Unremarkable.

L2-L3: Mild disc desiccation and bulging.

L3-L4: Unremarkable.

L4-L5: Mild disc desiccation and bulging with small central
protrusion

L5-S1:Borderline disc bulging.
IMPRESSION: Early disc degeneration as described. No neural impingement or
visible inflammation.

## 2021-02-12 IMAGING — MR MR CERVICAL SPINE W/O CM
5 series · 30 of 48 positions shown · non-contrast
Comparison: [DATE]

CLINICAL DATA: Chronic neck pain with bilateral arm pain. Upper
back pain for 3-4 years

EXAM:
MRI CERVICAL SPINE WITHOUT CONTRAST
TECHNIQUE: Multiplanar, multisequence MR imaging of the cervical spine was
performed. No intravenous contrast was administered.

[Series 6: T1 · sagittal · 3.0mm · 0.86mm/px · 7 of 14 slices shown]
[im 1/14]
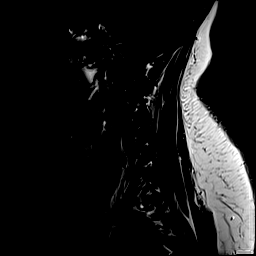
[im 3/14]
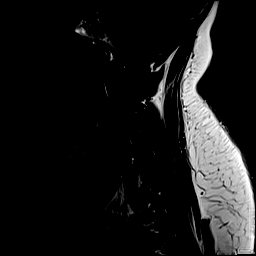
[im 5/14]
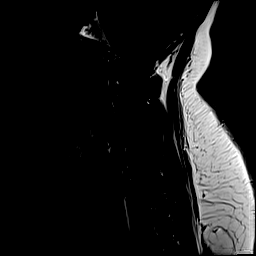
[im 7/14]
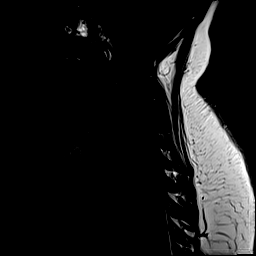
[im 9/14]
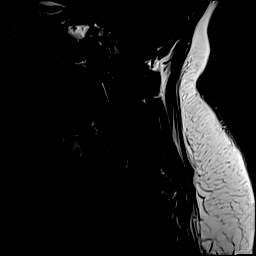
[im 11/14]
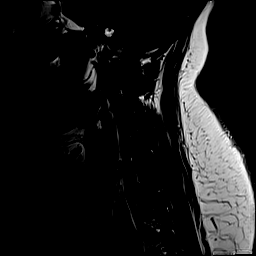
[im 14/14]
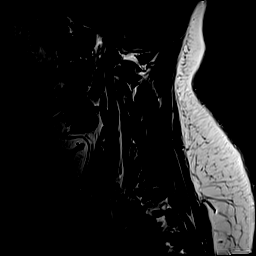

[Series 7: STIR · sagittal · 3.0mm · 0.34mm/px · 7 of 14 slices shown]
[im 1/14]
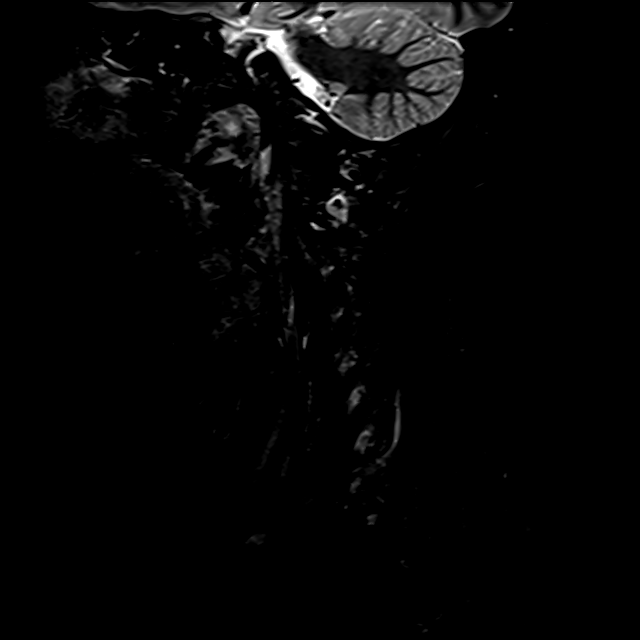
[im 3/14]
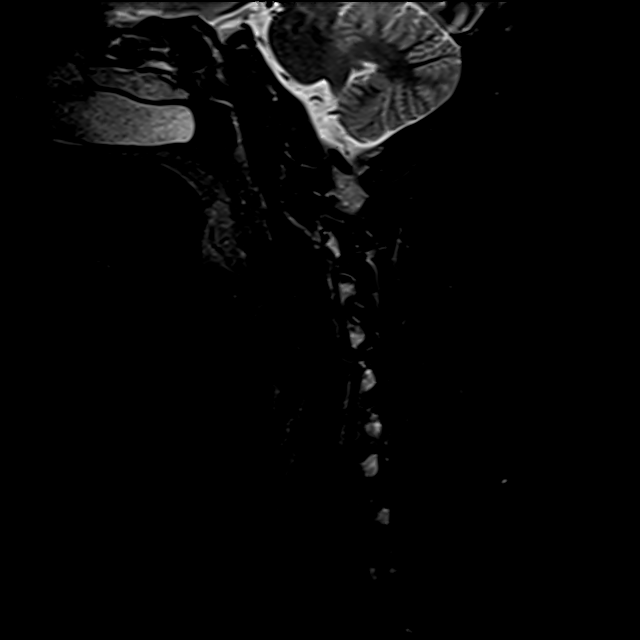
[im 5/14]
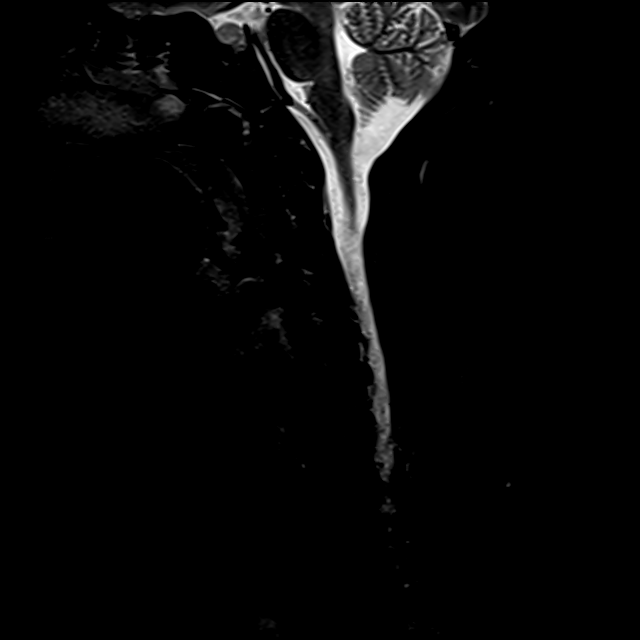
[im 7/14]
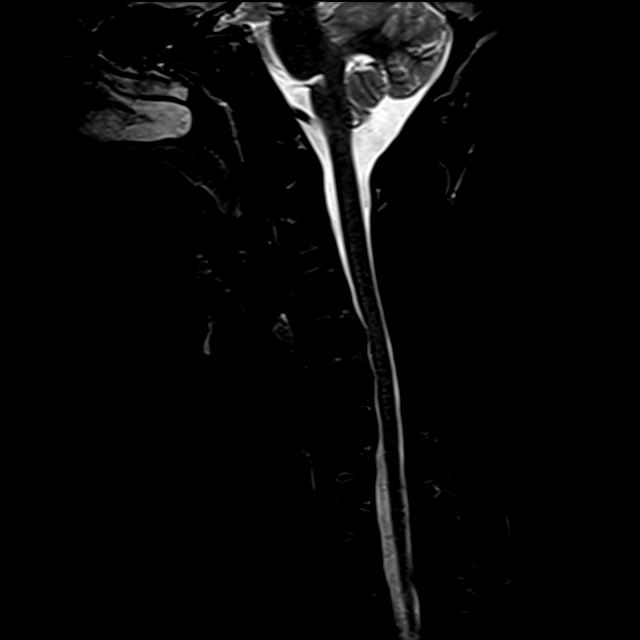
[im 9/14]
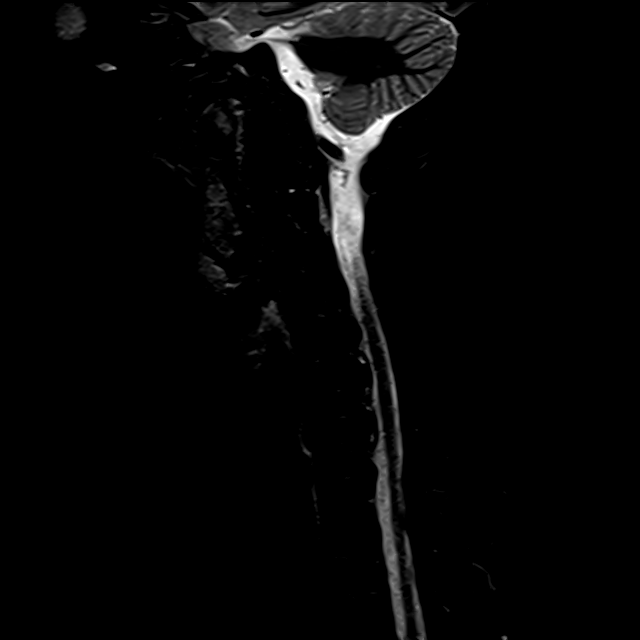
[im 11/14]
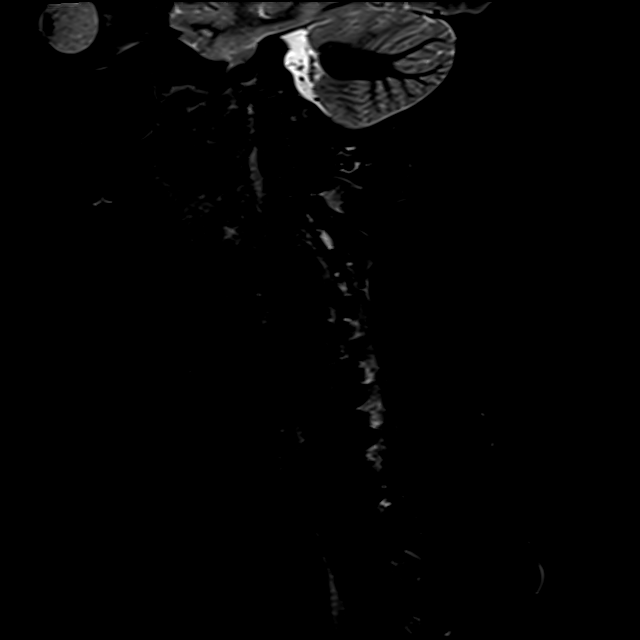
[im 14/14]
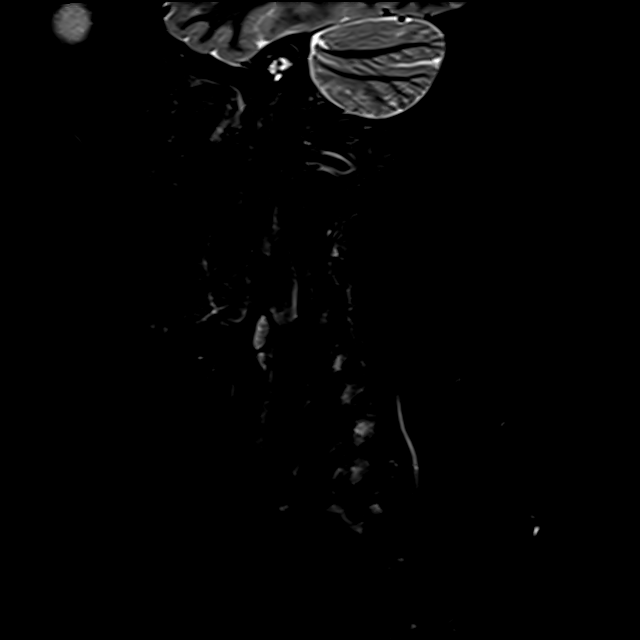

[Series 8: T2 · sagittal · 3.0mm · 0.69mm/px · 6 of 14 slices shown (1 of 2)]
[im 1/14]
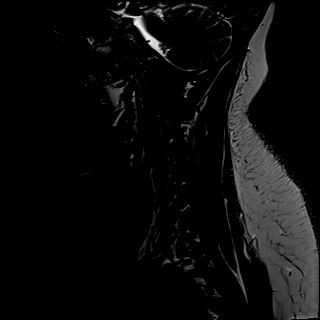
[im 3/14]
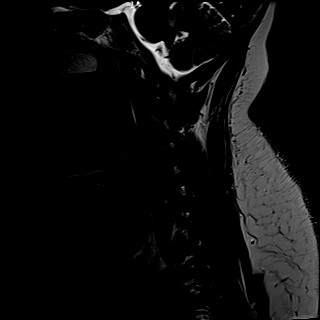
[im 6/14]
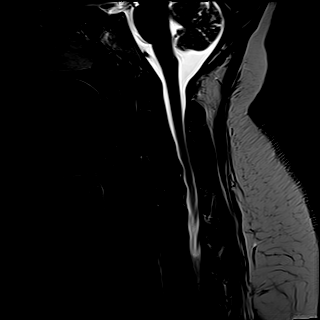
[im 8/14]
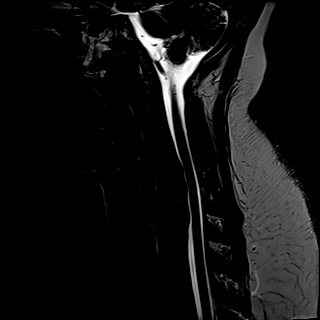
[im 11/14]
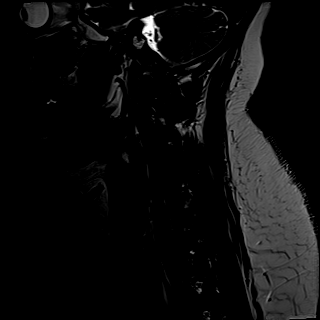
[im 14/14]
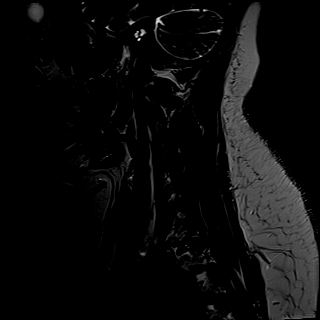

[Series 9: T2 · axial · 3.0mm · 0.70mm/px · z∈[-57,+44]mm · 8 of 32 slices shown (2 of 2)]
[im 1/32]
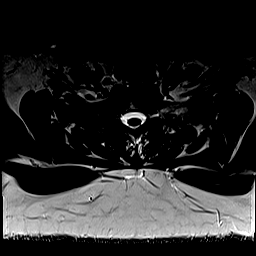
[im 5/32]
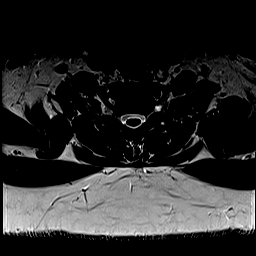
[im 10/32]
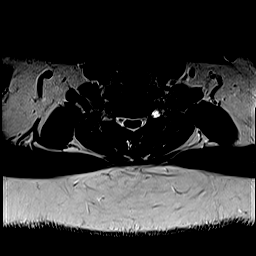
[im 15/32]
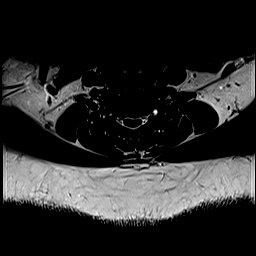
[im 17/32]
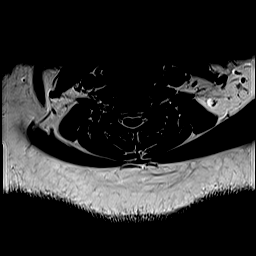
[im 22/32]
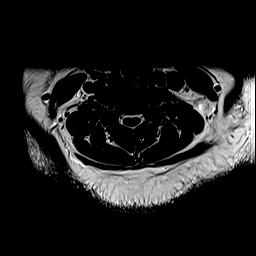
[im 27/32]
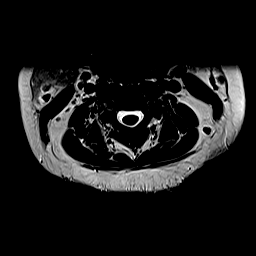
[im 32/32]
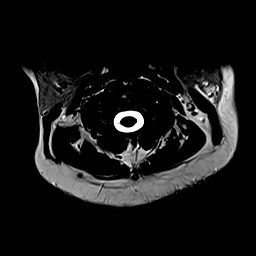

[Series 10: GRE · axial · 3.0mm · 0.35mm/px · z∈[-57,-44]mm · 2 of 32 slices shown]
[im 1/32]
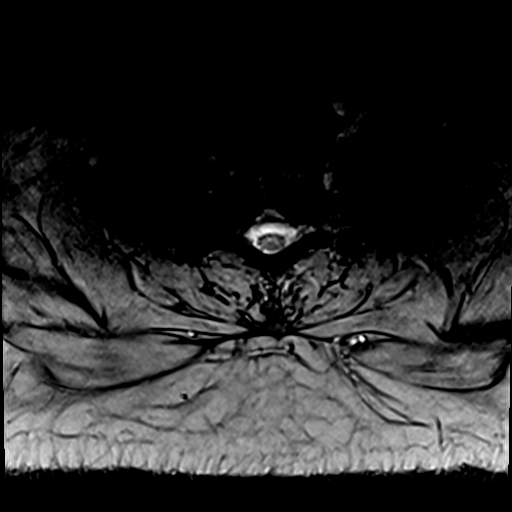
[im 5/32]
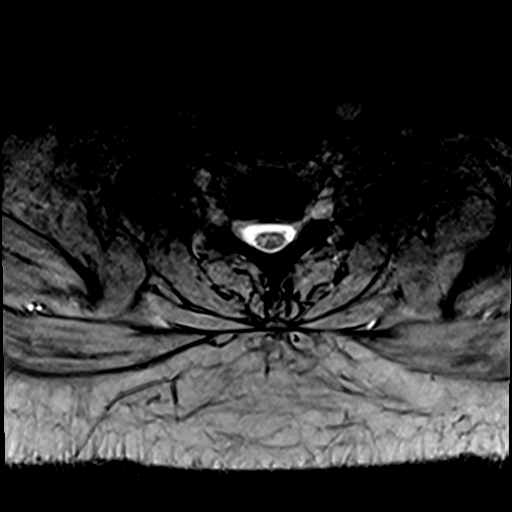

[30 of 48 positions shown; findings below may reference images not displayed]

FINDINGS: Alignment: Reversal of cervical lordosis which is likely positional.
Borderline anterolisthesis at C4-5.

Vertebrae: No fracture, evidence of discitis, or bone lesion.

Cord: Normal signal and morphology.

Posterior Fossa, vertebral arteries, paraspinal tissues: Negative.

Disc levels:

C2-3: Unremarkable.

C3-4: Unremarkable.

C4-5: Small central disc protrusion.  Mild disc narrowing.

C5-6: Mild disc narrowing and ventral spurring. Circumferential disc
bulging.

C6-7: Disc bulging and small central protrusion. Mild ventral
spurring

C7-T1:Unremarkable.
IMPRESSION: Disc degeneration with bulging and small protrusions at C4-5 to
C6-7.

Diffusely patent canal and foramina.

## 2021-02-12 MED ORDER — RETINOL MOLECULAR FILM 0.3 % OIL: TOPICAL_OIL | 11 refills | Status: DC

## 2021-02-12 NOTE — Telephone Encounter (Signed)
Pt also wants to follow up with Dr. Sharlet Salina on how the topiramate (TOPAMAX) 50 MG tablet was doing.   Pt states that she hasn't noticed a difference. Pt states that its been going ok, no side effects.  FYI

## 2021-02-12 NOTE — Telephone Encounter (Signed)
We can increase dose if desired or leave dosing same and reassess in 1-2 months.

## 2021-02-12 NOTE — Telephone Encounter (Signed)
Refill has been sent to the new pt's pharmacy.

## 2021-02-12 NOTE — Telephone Encounter (Signed)
Pt calling to requesting that the Rx for: Vitamin A (RETINOL MOLECULAR FILM) 0.3 % OIL [facility] [address] [facility] even though the system says it was confirmed on 1/26 @11 :04. Pt wants to start using a different pharmacy.  [facility]acy: [id number] - [place], [place] [facility]  Pt CB [phone]

## 2021-02-12 NOTE — Telephone Encounter (Signed)
Fyi.

## 2021-02-19 ENCOUNTER — Other Ambulatory Visit: Payer: Self-pay

## 2021-02-19 ENCOUNTER — Ambulatory Visit: Payer: 59 | Attending: Internal Medicine

## 2021-02-19 DIAGNOSIS — R293 Abnormal posture: Secondary | ICD-10-CM | POA: Insufficient documentation

## 2021-02-19 DIAGNOSIS — G8929 Other chronic pain: Secondary | ICD-10-CM | POA: Diagnosis present

## 2021-02-19 DIAGNOSIS — M542 Cervicalgia: Secondary | ICD-10-CM | POA: Diagnosis not present

## 2021-02-19 DIAGNOSIS — M6281 Muscle weakness (generalized): Secondary | ICD-10-CM | POA: Diagnosis present

## 2021-02-19 DIAGNOSIS — M545 Low back pain, unspecified: Secondary | ICD-10-CM | POA: Insufficient documentation

## 2021-02-19 NOTE — Patient Instructions (Signed)
Aquatic Therapy at [facility]-  What to Expect!  Where:   [facility] [address] [place], [place] [address] Rehab phone [phone]  NOTE:  You will receive an automated phone message reminding you of your appt and it will say the appointment is at the [address] [facility] [address] [facility] clinic.          How to Prepare: Please make sure you drink 8 ounces of water about one hour prior to your pool session A caregiver may attend if needed with the patient to help assist as needed. A caregiver can sit in the pool room on chair. Please arrive IN YOUR SUIT and 15 minutes prior to your appointment - this helps to avoid delays in starting your session. Please make sure to attend to any toileting needs prior to entering the pool Locker rooms for changing are provided.   There is direct access to the pool deck form the locker room.  You can lock your belongings in a locker with lock provided. Once on the pool deck your therapist will ask if you have signed the Patient  Consent and Assignment of Benefits form before beginning treatment Your therapist may take your blood pressure prior to, during and after your session if indicated We usually try and create a home exercise program based on activities we do in the pool.  Please be thinking about who might be able to assist you in the pool should you need to participate in an aquatic home exercise program at the time of discharge if you need assistance.  Some patients do not want to or do not have the ability to participate in an aquatic home program - this is not a barrier in any way to you participating in aquatic therapy as part of your current therapy plan! After Discharge from PT, you can continue using home program at  the [facility]/, there is a drop-in fee for $5 ($45 a month)or for 60 years  or older $4.00 ($40 a month for seniors ) or any local [employer] pool.  Memberships for purchase are  available for gym/pool at [facility]  IT IS VERY IMPORTANT THAT YOUR LAST VISIT BE IN THE CLINIC AT [address] AFTER YOUR LAST AQUATIC VISIT.  PLEASE MAKE SURE THAT YOU HAVE A [address]  APPOINTMENT SCHEDULED.   About the pool: Pool is located approximately 500 FT from the entrance of the building.  Please bring a support person if you need assistance traveling this      distance.   Your therapist will assist you in entering the water; there are two ways to           enter: stairs with railings, and a mechanical lift. Your therapist will determine the most appropriate way for you.  Water temperature is usually between 88-90 degrees  There may be up to 2 other swimmers in the pool at the same time  The pool deck is tile, please wear shoes with good traction if you prefer not to be barefoot.    Contact Info:  For appointment scheduling and cancellations:         Please call the [facility]  PH:[phone]              Aquatic Therapy  [facility]       All sessions are 45 minutes                                                    

## 2021-02-19 NOTE — Therapy (Signed)
OUTPATIENT PHYSICAL THERAPY THORACOLUMBAR EVALUATION   Patient Name: Caroline Carlson MRN: [id number] DOB:1980-11-29, 41 y.o., female Today's Date: 02/19/2021   PT End of Session - 02/19/21 1708     Visit Number 1    Number of Visits 8    Date for PT Re-Evaluation 04/16/21    Authorization Type UHC    Authorization Time Period 02/19/21-04/21/21    Progress Note Due on Visit 8    PT Start Time 1615    PT Stop Time 1700    PT Time Calculation (min) 45 min    Activity Tolerance Patient tolerated treatment well;Patient limited by pain    Behavior During Therapy [facility] for tasks assessed/performed             Past Medical History:  Diagnosis Date   Anemia    Anxiety    Back pain    Constipation    Depression    Fibromyalgia    History of blood transfusion 06/2002   [place] - unsure of number of units   Hyperlipidemia    borderline - diet controlled   Mental disorder    Currently depressed   Panic attack    PONV (postoperative nausea and vomiting)    Past Surgical History:  Procedure Laterality Date   BILATERAL SALPINGECTOMY N/A 09/25/2012   Procedure: BILATERAL SALPINGECTOMY;  Surgeon: Lahoma Crocker, MD;  Location: [facility];  Service: Gynecology;  Laterality: N/A;   CESAREAN SECTION  2002, 2004, 2006   x 3   ROBOTIC ASSISTED TOTAL HYSTERECTOMY N/A 09/25/2012   Procedure: ROBOTIC ASSISTED TOTAL HYSTERECTOMY;  Surgeon: Lahoma Crocker, MD;  Location: [facility];  Service: Gynecology;  Laterality: N/A;   TUBAL LIGATION     Patient Active Problem List   Diagnosis Date Noted   Acne 01/31/2021   Acute left-sided low back pain with left-sided sciatica 11/17/2020   Allergic rhinitis 11/17/2020   Protrusion of cervical intervertebral disc 07/07/2019   Prediabetes 05/05/2019   Elevated cholesterol 05/05/2019   Vitamin D deficiency 05/05/2019   Morbid obesity ([place]) 08/18/2013   Depression 08/14/2012    PCP: Hoyt Koch, MD  REFERRING PROVIDER: Hoyt Koch,  *  REFERRING DIAG: [id number] (ICD-10-CM) - Acute left-sided low back pain with left-sided sciatica M50.20 (ICD-10-CM) - Protrusion of cervical intervertebral disc   THERAPY DIAG:  Cervicalgia  Abnormal posture  Muscle weakness (generalized)  Chronic low back pain without sciatica, unspecified back pain laterality  ONSET DATE: 01/30/2021   SUBJECTIVE:  SUBJECTIVE STATEMENT: Describes a history of low back and cervical pain, has had previous PT as well as injections w/o lasting relief.    PERTINENT HISTORY:  Referral to PT for pain management as well as neurosurgery for long term strategy. She is in pain after working and for several days after. We talked about consideration of different job if this is impacting her long term health to avoid long term injury. Taking ibuprofen otc and this is helping with pain.    PAIN:  Are you having pain? Yes NPRS scale: 10/10 Pain location: neck and back Pain orientation: Bilateral  PAIN TYPE: aching, burning, and tight Pain description: constant  Aggravating factors: activity Relieving factors: undetermined   PRECAUTIONS: None  WEIGHT BEARING RESTRICTIONS No  FALLS:  Has patient fallen in last 6 months? No, Number of falls: 0  LIVING ENVIRONMENT: Lives with: lives with their family Lives in: House/apartment   OCCUPATION: forklift driver  PLOF: Independent  PATIENT GOALS: To manage back and neck pain   OBJECTIVE:   DIAGNOSTIC FINDINGS:  IMPRESSION: Cervical spondylosis as outlined and most notably as follows.   At C4-C5, disc bulge with superimposed small central disc protrusion. Uncinate hypertrophy. The disc protrusion contacts and mildly flattens the ventral spinal cord contributing to overall mild spinal canal stenosis. Mild left neural  foraminal narrowing.   At C5-C6, disc bulge with osteophyte ridge and uncinate hypertrophy contribute to mild left neural foraminal narrowing.    IMPRESSION: Early disc degeneration as described. No neural impingement or visible inflammation.  PATIENT SURVEYS:  ODI  SCREENING FOR RED FLAGS: Bowel or bladder incontinence: No  COGNITION:  Overall cognitive status: Within functional limits for tasks assessed     SENSATION:  Light touch: Appears intact    MUSCLE LENGTH: Hamstrings: Right 80 deg; Left 80 deg  POSTURE:  Rounded shoulders  SENSATION: Light touch: Appears intact  PALPATION: Global tenderness to soft tissues of low back, neck and upper back   CERVICAL AROM/PROM  A/PROM A/PROM (deg) 02/19/2021  Flexion *75%  Extension 50%  Right lateral flexion   Left lateral flexion   Right rotation *75%  Left rotation *WFL   (* assessed in supine due to pain)  UE AROM/PROM: WFL throughout  A/PROM Right 02/19/2021 Left 02/19/2021  Shoulder flexion    Shoulder extension    Shoulder abduction    Shoulder adduction    Shoulder extension    Shoulder internal rotation    Shoulder external rotation    Elbow flexion    Elbow extension    Wrist flexion    Wrist extension    Wrist ulnar deviation    Wrist radial deviation    Wrist pronation    Wrist supination     (Blank rows = not tested)  UE MMT: BLE strength [facility]  MMT Right 02/19/2021 Left 02/19/2021  Shoulder flexion    Shoulder extension    Shoulder abduction    Shoulder adduction    Shoulder extension    Middle trapezius    Lower trapezius    Elbow flexion    Elbow extension    Wrist flexion    Wrist extension    Wrist ulnar deviation    Wrist radial deviation    Wrist pronation    Wrist supination    Grip strength     (Blank rows = not tested)  CERVICAL SPECIAL TESTS:  Cranial cervical flexion test: Negative and Distraction test: Negative  FUNCTIONAL TESTS:  Not tested  LUMBAR AROM/PROM:  UTA due to time constraints and pain  A/PROM A/PROM  02/19/2021  Flexion   Extension   Right lateral flexion   Left lateral flexion   Right rotation   Left rotation    (Blank rows = not tested)  LE AROM/PROM: WFL throughout  A/PROM Right 02/19/2021 Left 02/19/2021  Hip flexion    Hip extension    Hip abduction    Hip adduction    Hip internal rotation    Hip external rotation    Knee flexion    Knee extension    Ankle dorsiflexion    Ankle plantarflexion    Ankle inversion    Ankle eversion     (Blank rows = not tested)  LE MMT:  MMT Right 02/19/2021 Left 02/19/2021  Hip flexion    Hip extension    Hip abduction    Hip adduction    Hip internal rotation    Hip external rotation    Knee flexion    Knee extension    Ankle dorsiflexion    Ankle plantarflexion    Ankle inversion    Ankle eversion    Abdominal/core strength 4- 4-   (Pain limited testing accuracy)  LUMBAR SPECIAL TESTS:  Straight leg raise test: Negative and Slump test: Negative  FUNCTIONAL TESTS:  UTA due to time constraints  GAIT: Distance walked: 150 Assistive device utilized: None Level of assistance: Complete Independence Comments: slow cadence    TODAY'S TREATMENT  2//6/23 Eval and HEP   PATIENT EDUCATION:  Education details: Discussed eval findings, rehab rationale and POC and patient is in agreement  Person educated: Patient Education method: Consulting civil engineer, Demonstration, Verbal cues, and Handouts Education comprehension: verbalized understanding, returned demonstration, and needs further education   HOME EXERCISE PROGRAM: Access Code: [email or web address] URL: [email or web address] Date: 02/19/2021 Prepared by: Sharlynn Oliphant  Exercises Supine March - 2 x daily - 7 x weekly - 2 sets - 10 reps Curl Up with Arms Crossed - 2 x daily - 7 x weekly - 2 sets - 10 reps Supine Lower Trunk Rotation - 2 x daily - 7 x weekly - 1 sets - 30s hold Supine Deep Neck Flexor Training - 2 x  daily - 7 x weekly - 1 sets - 10 reps - 3s hold   ASSESSMENT:  CLINICAL IMPRESSION: Patient is a 41 y.o. female who was seen today for physical therapy evaluation and treatment for cervical and lumbar pain. No radicular symptoms identified or reproduced in clinic today.  MRI scan pending.  Global pain and tenderness through soft tissues of cervical and lumbar regions prohibit accurate assessment of ROM and function.  Paient did respond favorable to SO distraction and gentle stretching. Objective impairments include Abnormal gait, decreased activity tolerance, decreased mobility, decreased ROM, decreased strength, and pain. These impairments are limiting patient from community activity, occupation, and exercise . Personal factors including Behavior pattern, Fitness, Past/current experiences, Profession, and Time since onset of injury/illness/exacerbation are also affecting patient's functional outcome. Patient will benefit from skilled PT to address above impairments and improve overall function.  REHAB POTENTIAL: Good  CLINICAL DECISION MAKING: Stable/uncomplicated  EVALUATION COMPLEXITY: Moderate   GOALS: Goals reviewed with patient? Yes  SHORT TERM GOALS:  STG Name Target Date Goal status  1 Patient to demonstrate independence in HEP  Baseline: [id number] 03/19/2021 INITIAL  2 Initiate aquatic therapy and return for f/u on land in 3 weeks Baseline: TBD 03/19/2021 INITIAL  3 Assess lumbar and cervical ROM in upright position  and establish goals Baseline: Cervical ROM assessed in supine 03/19/2021 INITIAL  4 Obtain ODI score Baseline:TBD 03/19/2021 INITIAL  LONG TERM GOALS:   LTG Name Target Date Goal status  1 Increase core/abdominal strength to 4/5 Baseline: 4-/5 on eval 04/16/2021 INITIAL  2 Increase cervical extension to 75%, R rotation to 90% Baseline: cervical extension 50%, R rotation 75% 04/16/2021 INITIAL  3 Assess progress towards lumbar ROM goals Baseline: TBD 04/16/2021 INITIAL   PLAN: PT FREQUENCY: 1x/week  PT DURATION: 8 weeks  PLANNED INTERVENTIONS: Therapeutic exercises, Therapeutic activity, Neuro Muscular re-education, Balance training, Gait training, Patient/Family education, Joint mobilization, Stair training, Aquatic Therapy, Dry Needling, Spinal mobilization, and Manual therapy  PLAN FOR NEXT SESSION: HEP review   Lanice Shirts, PT 02/19/2021, 5:22 PM   Check all possible CPT codes: 70340- Therapeutic Exercise, [phone]- Neuro Re-education, [phone] - Gait Training, [phone] - Manual Therapy, 97530 - Therapeutic Activities, and [phone] - Aquatic therapy

## 2021-02-20 ENCOUNTER — Ambulatory Visit (HOSPITAL_BASED_OUTPATIENT_CLINIC_OR_DEPARTMENT_OTHER): Payer: 59 | Attending: Internal Medicine | Admitting: Physical Therapy

## 2021-02-20 ENCOUNTER — Encounter (HOSPITAL_BASED_OUTPATIENT_CLINIC_OR_DEPARTMENT_OTHER): Payer: Self-pay | Admitting: Physical Therapy

## 2021-02-20 DIAGNOSIS — M5442 Lumbago with sciatica, left side: Secondary | ICD-10-CM | POA: Diagnosis not present

## 2021-02-20 DIAGNOSIS — M5020 Other cervical disc displacement, unspecified cervical region: Secondary | ICD-10-CM | POA: Diagnosis not present

## 2021-02-20 DIAGNOSIS — M545 Low back pain, unspecified: Secondary | ICD-10-CM

## 2021-02-20 DIAGNOSIS — M542 Cervicalgia: Secondary | ICD-10-CM

## 2021-02-20 DIAGNOSIS — G8929 Other chronic pain: Secondary | ICD-10-CM

## 2021-02-20 DIAGNOSIS — M6281 Muscle weakness (generalized): Secondary | ICD-10-CM

## 2021-02-20 DIAGNOSIS — R293 Abnormal posture: Secondary | ICD-10-CM

## 2021-02-20 NOTE — Therapy (Signed)
OUTPATIENT PHYSICAL THERAPY TREATMENT NOTE   Patient Name: MERA GUNKEL MRN: [id number] DOB:1980-12-12, 41 y.o., female Today's Date: 02/20/2021  PCP: Hoyt Koch, MD REFERRING PROVIDER: Hoyt Koch, *   PT End of Session - 02/20/21 1548     Visit Number 2    Number of Visits 8    Date for PT Re-Evaluation 04/16/21    Authorization Type UHC [id number]    Authorization Time Period 02/19/21-04/21/21    Progress Note Due on Visit 8    PT Start Time 1545    PT Stop Time 1615    PT Time Calculation (min) 30 min    Activity Tolerance Patient tolerated treatment well;Patient limited by pain    Behavior During Therapy [facility] for tasks assessed/performed             Past Medical History:  Diagnosis Date   Anemia    Anxiety    Back pain    Constipation    Depression    Fibromyalgia    History of blood transfusion 06/2002   [place] - unsure of number of units   Hyperlipidemia    borderline - diet controlled   Mental disorder    Currently depressed   Panic attack    PONV (postoperative nausea and vomiting)    Past Surgical History:  Procedure Laterality Date   BILATERAL SALPINGECTOMY N/A 09/25/2012   Procedure: BILATERAL SALPINGECTOMY;  Surgeon: Lahoma Crocker, MD;  Location: [facility];  Service: Gynecology;  Laterality: N/A;   CESAREAN SECTION  2002, 2004, 2006   x 3   ROBOTIC ASSISTED TOTAL HYSTERECTOMY N/A 09/25/2012   Procedure: ROBOTIC ASSISTED TOTAL HYSTERECTOMY;  Surgeon: Lahoma Crocker, MD;  Location: [facility];  Service: Gynecology;  Laterality: N/A;   TUBAL LIGATION     Patient Active Problem List   Diagnosis Date Noted   Acne 01/31/2021   Acute left-sided low back pain with left-sided sciatica 11/17/2020   Allergic rhinitis 11/17/2020   Protrusion of cervical intervertebral disc 07/07/2019   Prediabetes 05/05/2019   Elevated cholesterol 05/05/2019   Vitamin D deficiency 05/05/2019   Morbid obesity ([place]) 08/18/2013   Depression 08/14/2012     REFERRING DIAG:  M54.42 (ICD-10-CM) - Acute left-sided low back pain with left-sided sciatica M50.20 (ICD-10-CM) - Protrusion of cervical intervertebral disc   THERAPY DIAG:  Cervicalgia   Abnormal posture   Muscle weakness (generalized)   Chronic low back pain without sciatica, unspecified back pain laterality  PERTINENT HISTORY: Referral to PT for pain management as well as neurosurgery for long term strategy. She is in pain after working and for several days after. We talked about consideration of different job if this is impacting her long term health to avoid long term injury. Taking ibuprofen otc and this is helping with pain.  PRECAUTIONS: None  SUBJECTIVE: "we didn't do anything at therapy yesterday but gene ral ROM and I couldn't een get into my bed last night I hurt so bad"   PAIN:  Are you having pain? Yes NPRS scale: 9/10 Pain location: neck and back Pain orientation: Bilateral  PAIN TYPE: aching, burning, and tight Pain description: constant  Aggravating factors: activity Relieving factors: undetermined   PRECAUTIONS: None  WEIGHT BEARING RESTRICTIONS No  FALLS:  Has patient fallen in last 6 months? No, Number of falls: 0  LIVING ENVIRONMENT: Lives with: lives with their family Lives in: House/apartment   OCCUPATION: forklift driver  PLOF: Independent  PATIENT GOALS: To manage back and  neck pain   OBJECTIVE:   DIAGNOSTIC FINDINGS:  IMPRESSION: Cervical spondylosis as outlined and most notably as follows.   At C4-C5, disc bulge with superimposed small central disc protrusion. Uncinate hypertrophy. The disc protrusion contacts and mildly flattens the ventral spinal cord contributing to overall mild spinal canal stenosis. Mild left neural foraminal narrowing.   At C5-C6, disc bulge with osteophyte ridge and uncinate hypertrophy contribute to mild left neural foraminal narrowing.    IMPRESSION: Early disc degeneration as described. No  neural impingement or visible inflammation.  PATIENT SURVEYS:  ODI  SCREENING FOR RED FLAGS: Bowel or bladder incontinence: No  COGNITION:  Overall cognitive status: Within functional limits for tasks assessed     SENSATION:  Light touch: Appears intact    MUSCLE LENGTH: Hamstrings: Right 80 deg; Left 80 deg  POSTURE:  Rounded shoulders  SENSATION: Light touch: Appears intact  PALPATION: Global tenderness to soft tissues of low back, neck and upper back   CERVICAL AROM/PROM  A/PROM A/PROM (deg) 02/19/2021  Flexion *75%  Extension 50%  Right lateral flexion   Left lateral flexion   Right rotation *75%  Left rotation *WFL   (* assessed in supine due to pain)  UE AROM/PROM: WFL throughout  A/PROM Right 02/19/2021 Left 02/19/2021  Shoulder flexion    Shoulder extension    Shoulder abduction    Shoulder adduction    Shoulder extension    Shoulder internal rotation    Shoulder external rotation    Elbow flexion    Elbow extension    Wrist flexion    Wrist extension    Wrist ulnar deviation    Wrist radial deviation    Wrist pronation    Wrist supination     (Blank rows = not tested)  UE MMT: BLE strength [facility]  MMT Right 02/19/2021 Left 02/19/2021  Shoulder flexion    Shoulder extension    Shoulder abduction    Shoulder adduction    Shoulder extension    Middle trapezius    Lower trapezius    Elbow flexion    Elbow extension    Wrist flexion    Wrist extension    Wrist ulnar deviation    Wrist radial deviation    Wrist pronation    Wrist supination    Grip strength     (Blank rows = not tested)  CERVICAL SPECIAL TESTS:  Cranial cervical flexion test: Negative and Distraction test: Negative  FUNCTIONAL TESTS:  Not tested   LUMBAR AROM/PROM: UTA due to time constraints and pain  A/PROM A/PROM  02/19/2021  Flexion   Extension   Right lateral flexion   Left lateral flexion   Right rotation   Left rotation    (Blank rows = not tested)  LE  AROM/PROM: WFL throughout  A/PROM Right 02/19/2021 Left 02/19/2021  Hip flexion    Hip extension    Hip abduction    Hip adduction    Hip internal rotation    Hip external rotation    Knee flexion    Knee extension    Ankle dorsiflexion    Ankle plantarflexion    Ankle inversion    Ankle eversion     (Blank rows = not tested)  LE MMT:  MMT Right 02/19/2021 Left 02/19/2021  Hip flexion    Hip extension    Hip abduction    Hip adduction    Hip internal rotation    Hip external rotation    Knee flexion  Knee extension    Ankle dorsiflexion    Ankle plantarflexion    Ankle inversion    Ankle eversion    Abdominal/core strength 4- 4-   (Pain limited testing accuracy)  LUMBAR SPECIAL TESTS:  Straight leg raise test: Negative and Slump test: Negative  FUNCTIONAL TESTS:  UTA due to time constraints  GAIT: Distance walked: 150 Assistive device utilized: None Level of assistance: Complete Independence Comments: slow cadence    TODAY'S TREATMENT  Pt seen for aquatic therapy today.  Treatment took place in water 3.25-4.8 ft in depth at the [employer] pool. Temp of water was 91.  Pt entered/exited the pool via stairs step through pattern independently with bilat rail. Reviewed current function, pain levels, response to prior Rx, and HEP compliance.    Introduction to setting  Forward, backward and side stepping in 3 ft then in 4 ft 3 inches determining deep is tolerated better. Pt education on propeteries of water and benefits of aquatic therapy .  Standing  -LB stretching into hip flexion 3 x 25-30 sec hold.  Seated -stretching hamstrings, adductors, gastroc. 3 x 20 secs.  Pt with some fear avoidence stretching lle. -seated on noodle: hip hiking right and left for QL stretch.    Pt requires buoyancy for support and to offload joints with strengthening exercises. Viscosity of the water is needed for resistance of strengthening; water current perturbations  provides challenge to standing balance unsupported, requiring increased core activation.     PATIENT EDUCATION:  Education details: Discussed eval findings, rehab rationale and POC and patient is in agreement  Person educated: Patient Education method: Explanation, Demonstration, Verbal cues, and Handouts Education comprehension: verbalized understanding, returned demonstration, and needs further education   HOME EXERCISE PROGRAM: Access Code: [email or web address] URL: [email or web address] Date: 02/19/2021 Prepared by: Sharlynn Oliphant  Exercises Supine March - 2 x daily - 7 x weekly - 2 sets - 10 reps Curl Up with Arms Crossed - 2 x daily - 7 x weekly - 2 sets - 10 reps Supine Lower Trunk Rotation - 2 x daily - 7 x weekly - 1 sets - 30s hold Supine Deep Neck Flexor Training - 2 x daily - 7 x weekly - 1 sets - 10 reps - 3s hold   ASSESSMENT:  CLINICAL IMPRESSION: Pt arriving late limiting session time.  She is comfortable and confident in setting.  Pt amb through all depths with decreased pain while submerged in 4 ft.  Walking backwards increased discomfort. Directed pt through le and LB stretching then hip hiking sitting on noodle which she did not tolerate.   She is very limited by pain having muscle spasms in left LB area with pelvic movement.  Pt reporting pain flaring mostly in Left LB spanning across hip. Pt ended treatment in Jacuzzi (un-billed). She reports no compliance with HEP due to increase in pain after session.  She is instructed on the importance of movement and encouraged to complete even if only a few repetitions. She is a good candidate for aquatic therapy as it will allow for improved movement with less discomfort.  She is scheduled to have injection LB in early March.  Patient is a 41 y.o. female who was seen today for physical therapy evaluation and treatment for cervical and lumbar pain. No radicular symptoms identified or reproduced in clinic today.  MRI scan  pending.  Global pain and tenderness through soft tissues of cervical and lumbar regions prohibit accurate assessment of ROM and function.  Paient  did respond favorable to SO distraction and gentle stretching. Objective impairments include Abnormal gait, decreased activity tolerance, decreased mobility, decreased ROM, decreased strength, and pain. These impairments are limiting patient from community activity, occupation, and exercise. Personal factors including Behavior pattern, Fitness, Past/current experiences, Profession, and Time since onset of injury/illness/exacerbation are also affecting patient's functional outcome. Patient will benefit from skilled PT to address above impairments and improve overall function.  REHAB POTENTIAL: Good  CLINICAL DECISION MAKING: Stable/uncomplicated  EVALUATION COMPLEXITY: Moderate   GOALS: Goals reviewed with patient? Yes  SHORT TERM GOALS:  STG Name Target Date Goal status  1 Patient to demonstrate independence in HEP  Baseline: [id number] 03/19/2021 INITIAL  2 Initiate aquatic therapy and return for f/u on land in 3 weeks Baseline: TBD 03/19/2021 INITIAL  3 Assess lumbar and cervical ROM in upright position and establish goals Baseline: Cervical ROM assessed in supine 03/19/2021 INITIAL  4 Obtain ODI score Baseline:TBD 03/19/2021 INITIAL  LONG TERM GOALS:   LTG Name Target Date Goal status  1 Increase core/abdominal strength to 4/5 Baseline: 4-/5 on eval 04/16/2021 INITIAL  2 Increase cervical extension to 75%, R rotation to 90% Baseline: cervical extension 50%, R rotation 75% 04/16/2021 INITIAL  3 Assess progress towards lumbar ROM goals Baseline: TBD 04/16/2021 INITIAL  PLAN: PT FREQUENCY: 1x/week  PT DURATION: 8 weeks  PLANNED INTERVENTIONS: Therapeutic exercises, Therapeutic activity, Neuro Muscular re-education, Balance training, Gait training, Patient/Family education, Joint mobilization, Stair training, Aquatic Therapy, Dry Needling, Spinal  mobilization, and Manual therapy  PLAN FOR NEXT SESSION: HEP review   Stanton Kidney (Tharon Aquas) Nichelle Renwick MPT 02/20/2021, 5:51 PM

## 2021-02-21 ENCOUNTER — Encounter: Payer: Self-pay | Admitting: Internal Medicine

## 2021-02-21 NOTE — Telephone Encounter (Signed)
Patient is requesting a muscle relaxant for pain . Please advise

## 2021-02-22 MED ORDER — CYCLOBENZAPRINE HCL 5 MG PO TABS: 5.0000 mg | ORAL_TABLET | Freq: Three times a day (TID) | ORAL | 1 refills | Status: DC | PRN

## 2021-02-28 ENCOUNTER — Ambulatory Visit (HOSPITAL_BASED_OUTPATIENT_CLINIC_OR_DEPARTMENT_OTHER): Payer: 59 | Admitting: Physical Therapy

## 2021-02-28 ENCOUNTER — Encounter (HOSPITAL_BASED_OUTPATIENT_CLINIC_OR_DEPARTMENT_OTHER): Payer: Self-pay | Admitting: Physical Therapy

## 2021-02-28 ENCOUNTER — Other Ambulatory Visit: Payer: Self-pay

## 2021-02-28 DIAGNOSIS — M6281 Muscle weakness (generalized): Secondary | ICD-10-CM

## 2021-02-28 DIAGNOSIS — G8929 Other chronic pain: Secondary | ICD-10-CM

## 2021-02-28 DIAGNOSIS — M5442 Lumbago with sciatica, left side: Secondary | ICD-10-CM | POA: Diagnosis not present

## 2021-02-28 DIAGNOSIS — M542 Cervicalgia: Secondary | ICD-10-CM

## 2021-02-28 DIAGNOSIS — R293 Abnormal posture: Secondary | ICD-10-CM

## 2021-02-28 DIAGNOSIS — M545 Low back pain, unspecified: Secondary | ICD-10-CM

## 2021-02-28 NOTE — Therapy (Signed)
OUTPATIENT PHYSICAL THERAPY TREATMENT NOTE   Patient Name: Caroline Carlson MRN: [id number] DOB:1980/08/22, 41 y.o., female Today's Date: 02/28/2021  PCP: Hoyt Koch, MD REFERRING PROVIDER: Hoyt Koch, *   PT End of Session - 02/28/21 1550     Visit Number 3    Number of Visits 8    Date for PT Re-Evaluation 04/16/21    Authorization Type UHC MCD 27 VL    Authorization Time Period 02/19/21-04/21/21    Progress Note Due on Visit 8    PT Start Time 0346    PT Stop Time 0430    PT Time Calculation (min) 44 min    Activity Tolerance Patient tolerated treatment well;Patient limited by pain    Behavior During Therapy [facility] for tasks assessed/performed             Past Medical History:  Diagnosis Date   Anemia    Anxiety    Back pain    Constipation    Depression    Fibromyalgia    History of blood transfusion 06/2002   [place] - unsure of number of units   Hyperlipidemia    borderline - diet controlled   Mental disorder    Currently depressed   Panic attack    PONV (postoperative nausea and vomiting)    Past Surgical History:  Procedure Laterality Date   BILATERAL SALPINGECTOMY N/A 09/25/2012   Procedure: BILATERAL SALPINGECTOMY;  Surgeon: Lahoma Crocker, MD;  Location: [place] [facility];  Service: Gynecology;  Laterality: N/A;   CESAREAN SECTION  2002, 2004, 2006   x 3   ROBOTIC ASSISTED TOTAL HYSTERECTOMY N/A 09/25/2012   Procedure: ROBOTIC ASSISTED TOTAL HYSTERECTOMY;  Surgeon: Lahoma Crocker, MD;  Location: [place] [facility];  Service: Gynecology;  Laterality: N/A;   TUBAL LIGATION     Patient Active Problem List   Diagnosis Date Noted   Acne 01/31/2021   Acute left-sided low back pain with left-sided sciatica 11/17/2020   Allergic rhinitis 11/17/2020   Protrusion of cervical intervertebral disc 07/07/2019   Prediabetes 05/05/2019   Elevated cholesterol 05/05/2019   Vitamin D deficiency 05/05/2019   Morbid obesity ([place]) 08/18/2013   Depression 08/14/2012     REFERRING DIAG:  M54.42 (ICD-10-CM) - Acute left-sided low back pain with left-sided sciatica M50.20 (ICD-10-CM) - Protrusion of cervical intervertebral disc   THERAPY DIAG:  Cervicalgia   Abnormal posture   Muscle weakness (generalized)   Chronic low back pain without sciatica, unspecified back pain laterality  PERTINENT HISTORY: Referral to PT for pain management as well as neurosurgery for long term strategy. She is in pain after working and for several days after. We talked about consideration of different job if this is impacting her long term health to avoid long term injury. Taking ibuprofen otc and this is helping with pain.  PRECAUTIONS: None  SUBJECTIVE: "I am much better.  No pain today but tightness in buttock and LB" Pt reports taking Aleve and has new prescription of flexoril which has helped.  Pt also reports getting a membership here at [place]:  Are you having pain? No.  "Just stiffness." NPRS scale: 0/10 Pain location: neck and back Pain orientation: Bilateral  PAIN TYPE: aching, burning, and tight Pain description: constant  Aggravating factors: activity Relieving factors: undetermined   PRECAUTIONS: None  WEIGHT BEARING RESTRICTIONS No  FALLS:  Has patient fallen in last 6 months? No, Number of falls: 0  LIVING ENVIRONMENT: Lives with: lives with their family Lives in: House/apartment  OCCUPATION: forklift driver  PLOF: Independent  PATIENT GOALS: To manage back and neck pain   OBJECTIVE:   DIAGNOSTIC FINDINGS:  IMPRESSION: Cervical spondylosis as outlined and most notably as follows.   At C4-C5, disc bulge with superimposed small central disc protrusion. Uncinate hypertrophy. The disc protrusion contacts and mildly flattens the ventral spinal cord contributing to overall mild spinal canal stenosis. Mild left neural foraminal narrowing.   At C5-C6, disc bulge with osteophyte ridge and uncinate hypertrophy contribute to mild  left neural foraminal narrowing.    IMPRESSION: Early disc degeneration as described. No neural impingement or visible inflammation.  PATIENT SURVEYS:  ODI  SCREENING FOR RED FLAGS: Bowel or bladder incontinence: No  COGNITION:  Overall cognitive status: Within functional limits for tasks assessed     SENSATION:  Light touch: Appears intact    MUSCLE LENGTH: Hamstrings: Right 80 deg; Left 80 deg  POSTURE:  Rounded shoulders  SENSATION: Light touch: Appears intact  PALPATION: Global tenderness to soft tissues of low back, neck and upper back   CERVICAL AROM/PROM  A/PROM A/PROM (deg) 02/19/2021  Flexion *75%  Extension 50%  Right lateral flexion   Left lateral flexion   Right rotation *75%  Left rotation *WFL   (* assessed in supine due to pain)  UE AROM/PROM: WFL throughout  A/PROM Right 02/19/2021 Left 02/19/2021  Shoulder flexion    Shoulder extension    Shoulder abduction    Shoulder adduction    Shoulder extension    Shoulder internal rotation    Shoulder external rotation    Elbow flexion    Elbow extension    Wrist flexion    Wrist extension    Wrist ulnar deviation    Wrist radial deviation    Wrist pronation    Wrist supination     (Blank rows = not tested)  UE MMT: BLE strength [facility]  MMT Right 02/19/2021 Left 02/19/2021  Shoulder flexion    Shoulder extension    Shoulder abduction    Shoulder adduction    Shoulder extension    Middle trapezius    Lower trapezius    Elbow flexion    Elbow extension    Wrist flexion    Wrist extension    Wrist ulnar deviation    Wrist radial deviation    Wrist pronation    Wrist supination    Grip strength     (Blank rows = not tested)  CERVICAL SPECIAL TESTS:  Cranial cervical flexion test: Negative and Distraction test: Negative  FUNCTIONAL TESTS:  Not tested   LUMBAR AROM/PROM: UTA due to time constraints and pain  A/PROM A/PROM  02/19/2021  Flexion   Extension   Right lateral flexion    Left lateral flexion   Right rotation   Left rotation    (Blank rows = not tested)  LE AROM/PROM: WFL throughout  A/PROM Right 02/19/2021 Left 02/19/2021  Hip flexion    Hip extension    Hip abduction    Hip adduction    Hip internal rotation    Hip external rotation    Knee flexion    Knee extension    Ankle dorsiflexion    Ankle plantarflexion    Ankle inversion    Ankle eversion     (Blank rows = not tested)  LE MMT:  MMT Right 02/19/2021 Left 02/19/2021  Hip flexion    Hip extension    Hip abduction    Hip adduction    Hip internal rotation  Hip external rotation    Knee flexion    Knee extension    Ankle dorsiflexion    Ankle plantarflexion    Ankle inversion    Ankle eversion    Abdominal/core strength 4- 4-   (Pain limited testing accuracy)  LUMBAR SPECIAL TESTS:  Straight leg raise test: Negative and Slump test: Negative  FUNCTIONAL TESTS:  UTA due to time constraints  GAIT: Distance walked: 150 Assistive device utilized: None Level of assistance: Complete Independence Comments: slow cadence    TODAY'S TREATMENT  Pt seen for aquatic therapy today.  Treatment took place in water 3.25-4.8 ft in depth at the [facility] pool. Temp of water was 91.  Pt entered/exited the pool via stairs step through pattern independently with bilat rail. Reviewed current function, pain levels, response to prior Rx, and HEP compliance.     Forward, backward and side stepping. Pt without UE support.  Moving with increased cadence and agility  Standing  -LB stretching into hip flexion 3 x 25-30 sec hold. -plank on step hip extension R/L x7 with glute tightening with each extension  Seated -stretching hamstrings, adductors, gastroc; Piriformis and glut 3 x 20 secs.   -flutter kicking (SLR) at hip 3 x 20 reps -add/abd 3x20 reps -seated on noodle: hip hiking right and left for QL stretch. Anterior/posterior. R/L circular    Pt requires buoyancy for  support and to offload joints with strengthening exercises. Viscosity of the water is needed for resistance of strengthening; water current perturbations provides challenge to standing balance unsupported, requiring increased core activation.     PATIENT EDUCATION:  Education details: Discussed eval findings, rehab rationale and POC and patient is in agreement  Person educated: Patient Education method: Explanation, Demonstration, Verbal cues, and Handouts Education comprehension: verbalized understanding, returned demonstration, and needs further education 02/28/21 Tylenol vs anti-inflammatory's    HOME EXERCISE PROGRAM: Access Code: [email or web address] URL: [email or web address] Date: 02/19/2021 Prepared by: Sharlynn Oliphant  Exercises Supine March - 2 x daily - 7 x weekly - 2 sets - 10 reps Curl Up with Arms Crossed - 2 x daily - 7 x weekly - 2 sets - 10 reps Supine Lower Trunk Rotation - 2 x daily - 7 x weekly - 1 sets - 30s hold Supine Deep Neck Flexor Training - 2 x daily - 7 x weekly - 1 sets - 10 reps - 3s hold   ASSESSMENT:  CLINICAL IMPRESSION: Excellent response to last session.  She now states she is compliance due to improved toleration with HEP daily. HEP reviewed. No pain today just tightness.  She is tolerating work with improvement taking muscle relaxer as needed.  Does have high pain after her shift which resloves by morning.  Some discomfort with pelvic rotations particularly laterally, minor burning.  No increase of sx after session. She has been able to sleep in her bed although does occasionally rest in recliner if unable to get comfortable Overall pt improved with functional mobility. Pt with improved gait: normal cadence and step length submerged and entering into pool today.  She will continue to benefit from aquatic therapy to gain strength, decrease pain and return to PLOF    Patient is a 41 y.o. female who was seen today for physical therapy evaluation  and treatment for cervical and lumbar pain. No radicular symptoms identified or reproduced in clinic today.  MRI scan pending.  Global pain and tenderness through soft tissues of cervical and lumbar regions prohibit accurate assessment of ROM and  function.  Paient did respond favorable to SO distraction and gentle stretching. Objective impairments include Abnormal gait, decreased activity tolerance, decreased mobility, decreased ROM, decreased strength, and pain. These impairments are limiting patient from community activity, occupation, and exercise. Personal factors including Behavior pattern, Fitness, Past/current experiences, Profession, and Time since onset of injury/illness/exacerbation are also affecting patient's functional outcome. Patient will benefit from skilled PT to address above impairments and improve overall function.  REHAB POTENTIAL: Good  CLINICAL DECISION MAKING: Stable/uncomplicated  EVALUATION COMPLEXITY: Moderate   GOALS: Goals reviewed with patient? Yes  SHORT TERM GOALS:  STG Name Target Date Goal status  1 Patient to demonstrate independence in HEP  Baseline: [id number] 03/19/2021 INITIAL  2 Initiate aquatic therapy and return for f/u on land in 3 weeks Baseline: TBD 03/19/2021 INITIAL  3 Assess lumbar and cervical ROM in upright position and establish goals Baseline: Cervical ROM assessed in supine 03/19/2021 INITIAL  4 Obtain ODI score Baseline:TBD 03/19/2021 INITIAL  LONG TERM GOALS:   LTG Name Target Date Goal status  1 Increase core/abdominal strength to 4/5 Baseline: 4-/5 on eval 04/16/2021 INITIAL  2 Increase cervical extension to 75%, R rotation to 90% Baseline: cervical extension 50%, R rotation 75% 04/16/2021 INITIAL  3 Assess progress towards lumbar ROM goals Baseline: TBD 04/16/2021 INITIAL  PLAN: PT FREQUENCY: 1x/week  PT DURATION: 8 weeks  PLANNED INTERVENTIONS: Therapeutic exercises, Therapeutic activity, Neuro Muscular re-education, Balance training,  Gait training, Patient/Family education, Joint mobilization, Stair training, Aquatic Therapy, Dry Needling, Spinal mobilization, and Manual therapy  PLAN FOR NEXT SESSION: HEP review   Stanton Kidney (Frankie) Randale Carvalho MPT 02/28/2021, 7:23 PM

## 2021-03-01 ENCOUNTER — Ambulatory Visit: Payer: 59

## 2021-03-01 DIAGNOSIS — M542 Cervicalgia: Secondary | ICD-10-CM | POA: Diagnosis not present

## 2021-03-01 DIAGNOSIS — M6281 Muscle weakness (generalized): Secondary | ICD-10-CM

## 2021-03-01 DIAGNOSIS — G8929 Other chronic pain: Secondary | ICD-10-CM

## 2021-03-01 DIAGNOSIS — M545 Low back pain, unspecified: Secondary | ICD-10-CM

## 2021-03-01 DIAGNOSIS — R293 Abnormal posture: Secondary | ICD-10-CM

## 2021-03-01 NOTE — Therapy (Signed)
OUTPATIENT PHYSICAL THERAPY TREATMENT NOTE   Patient Name: Caroline Carlson MRN: [id number] DOB:10-14-1980, 41 y.o., female Today's Date: 03/01/2021  PCP: Hoyt Koch, MD REFERRING PROVIDER: Hoyt Koch, *   PT End of Session - 03/01/21 1448     Visit Number 4    Number of Visits 8    Date for PT Re-Evaluation 04/16/21    Authorization Type UHC MCD 27 VL    Authorization Time Period 02/19/21-04/21/21    Progress Note Due on Visit 8    PT Start Time 9622    PT Stop Time 1530    PT Time Calculation (min) 45 min    Activity Tolerance Patient tolerated treatment well;Patient limited by pain    Behavior During Therapy [facility] for tasks assessed/performed              Past Medical History:  Diagnosis Date   Anemia    Anxiety    Back pain    Constipation    Depression    Fibromyalgia    History of blood transfusion 06/2002   [place] - unsure of number of units   Hyperlipidemia    borderline - diet controlled   Mental disorder    Currently depressed   Panic attack    PONV (postoperative nausea and vomiting)    Past Surgical History:  Procedure Laterality Date   BILATERAL SALPINGECTOMY N/A 09/25/2012   Procedure: BILATERAL SALPINGECTOMY;  Surgeon: Lahoma Crocker, MD;  Location: [place] [facility];  Service: Gynecology;  Laterality: N/A;   CESAREAN SECTION  2002, 2004, 2006   x 3   ROBOTIC ASSISTED TOTAL HYSTERECTOMY N/A 09/25/2012   Procedure: ROBOTIC ASSISTED TOTAL HYSTERECTOMY;  Surgeon: Lahoma Crocker, MD;  Location: [place] [facility];  Service: Gynecology;  Laterality: N/A;   TUBAL LIGATION     Patient Active Problem List   Diagnosis Date Noted   Acne 01/31/2021   Acute left-sided low back pain with left-sided sciatica 11/17/2020   Allergic rhinitis 11/17/2020   Protrusion of cervical intervertebral disc 07/07/2019   Prediabetes 05/05/2019   Elevated cholesterol 05/05/2019   Vitamin D deficiency 05/05/2019   Morbid obesity ([place]) 08/18/2013   Depression 08/14/2012     REFERRING DIAG:  M54.42 (ICD-10-CM) - Acute left-sided low back pain with left-sided sciatica M50.20 (ICD-10-CM) - Protrusion of cervical intervertebral disc   THERAPY DIAG:  Cervicalgia   Abnormal posture   Muscle weakness (generalized)   Chronic low back pain without sciatica, unspecified back pain laterality  PERTINENT HISTORY: Referral to PT for pain management as well as neurosurgery for long term strategy. She is in pain after working and for several days after. We talked about consideration of different job if this is impacting her long term health to avoid long term injury. Taking ibuprofen otc and this is helping with pain.  PRECAUTIONS: None  SUBJECTIVE: Reports only minimal pain, more soreness than anything else.   PAIN:  Are you having pain? yes NPRS scale: 2/10 Pain location: neck and back Pain orientation: Bilateral  PAIN TYPE: aching, burning, and tight Pain description: constant  Aggravating factors: activity Relieving factors: undetermined   PRECAUTIONS: None  WEIGHT BEARING RESTRICTIONS No  FALLS:  Has patient fallen in last 6 months? No, Number of falls: 0  LIVING ENVIRONMENT: Lives with: lives with their family Lives in: House/apartment   OCCUPATION: forklift driver  PLOF: Independent  PATIENT GOALS: To manage back and neck pain   OBJECTIVE:   DIAGNOSTIC FINDINGS:  IMPRESSION: Cervical spondylosis as outlined  and most notably as follows.   At C4-C5, disc bulge with superimposed small central disc protrusion. Uncinate hypertrophy. The disc protrusion contacts and mildly flattens the ventral spinal cord contributing to overall mild spinal canal stenosis. Mild left neural foraminal narrowing.   At C5-C6, disc bulge with osteophyte ridge and uncinate hypertrophy contribute to mild left neural foraminal narrowing.    IMPRESSION: Early disc degeneration as described. No neural impingement or visible inflammation.  PATIENT SURVEYS:   ODI  SCREENING FOR RED FLAGS: Bowel or bladder incontinence: No  COGNITION:  Overall cognitive status: Within functional limits for tasks assessed     SENSATION:  Light touch: Appears intact    MUSCLE LENGTH: Hamstrings: Right 80 deg; Left 80 deg  POSTURE:  Rounded shoulders  SENSATION: Light touch: Appears intact  PALPATION: Global tenderness to soft tissues of low back, neck and upper back   CERVICAL AROM/PROM  A/PROM A/PROM (deg) 02/19/2021  Flexion *75%  Extension 50%  Right lateral flexion   Left lateral flexion   Right rotation *75%  Left rotation *WFL   (* assessed in supine due to pain)  UE AROM/PROM: WFL throughout  A/PROM Right 02/19/2021 Left 02/19/2021  Shoulder flexion    Shoulder extension    Shoulder abduction    Shoulder adduction    Shoulder extension    Shoulder internal rotation    Shoulder external rotation    Elbow flexion    Elbow extension    Wrist flexion    Wrist extension    Wrist ulnar deviation    Wrist radial deviation    Wrist pronation    Wrist supination      UE MMT: BLE strength [facility]  MMT Right 02/19/2021 Left 02/19/2021  Shoulder flexion    Shoulder extension    Shoulder abduction    Shoulder adduction    Shoulder extension    Middle trapezius    Lower trapezius    Elbow flexion    Elbow extension    Wrist flexion    Wrist extension    Wrist ulnar deviation    Wrist radial deviation    Wrist pronation    Wrist supination    Grip strength      CERVICAL SPECIAL TESTS:  Cranial cervical flexion test: Negative and Distraction test: Negative  FUNCTIONAL TESTS:  Not tested   LUMBAR AROM/PROM:   A/PROM A/PROM  03/01/21  Flexion 75%  Extension 25%  Right lateral flexion 50%  Left lateral flexion 50%  Right rotation   Left rotation    (Blank rows = not tested)  LE AROM/PROM: WFL throughout  A/PROM Right 02/19/2021 Left 02/19/2021  Hip flexion    Hip extension    Hip abduction    Hip adduction    Hip  internal rotation    Hip external rotation    Knee flexion    Knee extension    Ankle dorsiflexion    Ankle plantarflexion    Ankle inversion    Ankle eversion       LE MMT:  MMT Right 02/19/2021 Left 02/19/2021  Hip flexion    Hip extension    Hip abduction    Hip adduction    Hip internal rotation    Hip external rotation    Knee flexion    Knee extension    Ankle dorsiflexion    Ankle plantarflexion    Ankle inversion    Ankle eversion    Abdominal/core strength 4- 4-   (Pain limited testing  accuracy)  LUMBAR SPECIAL TESTS:  Straight leg raise test: Negative and Slump test: Negative  FUNCTIONAL TESTS:  UTA due to time constraints  GAIT: Distance walked: 150 Assistive device utilized: None Level of assistance: Complete Independence Comments: slow cadence    [facility] Adult PT Treatment:                                                DATE: 03/01/21 Therapeutic Exercise: Nustep seat 8 arms 8 L1 6 min Pelvic tilt x10 Supine march with PT(needed to perform w/o tilt due to pain) Open book 10x B Curl up x10 Bridge x10 DNF x10  TREATMENT  Pt seen for aquatic therapy today.  Treatment took place in water 3.25-4.8 ft in depth at the [facility] pool. Temp of water was 91.  Pt entered/exited the pool via stairs step through pattern independently with bilat rail. Reviewed current function, pain levels, response to prior Rx, and HEP compliance.     Forward, backward and side stepping. Pt without UE support.  Moving with increased cadence and agility  Standing  -LB stretching into hip flexion 3 x 25-30 sec hold. -plank on step hip extension R/L x7 with glute tightening with each extension  Seated -stretching hamstrings, adductors, gastroc; Piriformis and glut 3 x 20 secs.   -flutter kicking (SLR) at hip 3 x 20 reps -add/abd 3x20 reps -seated on noodle: hip hiking right and left for QL stretch. Anterior/posterior. R/L circular    Pt requires buoyancy for  support and to offload joints with strengthening exercises. Viscosity of the water is needed for resistance of strengthening; water current perturbations provides challenge to standing balance unsupported, requiring increased core activation.     PATIENT EDUCATION:  Education details: Discussed eval findings, rehab rationale and POC and patient is in agreement  Person educated: Patient Education method: Explanation, Demonstration, Verbal cues, and Handouts Education comprehension: verbalized understanding, returned demonstration, and needs further education 02/28/21 Tylenol vs anti-inflammatory's    HOME EXERCISE PROGRAM: Access Code: [email or web address] URL: [email or web address] Date: 02/19/2021 Prepared by: Sharlynn Oliphant  Exercises Supine March - 2 x daily - 7 x weekly - 2 sets - 10 reps Curl Up with Arms Crossed - 2 x daily - 7 x weekly - 2 sets - 10 reps Supine Lower Trunk Rotation - 2 x daily - 7 x weekly - 1 sets - 30s hold Supine Deep Neck Flexor Training - 2 x daily - 7 x weekly - 1 sets - 10 reps - 3s hold   ASSESSMENT:  CLINICAL IMPRESSION: Pain has diminished to "soreness".  Has been active at home with HEP. Continues with weakness in core musculature but specific pain noted in anterior L hip with flexion tasks making positional changes and LLE exercises difficult.  Some upper extremity tingling with open book suggesting myofascial origin.  Overall she feels less discomfort with activities outside of the clinic and is benefiting from therapy at this time.   REHAB POTENTIAL: Good  CLINICAL DECISION MAKING: Stable/uncomplicated  EVALUATION COMPLEXITY: Moderate   GOALS: Goals reviewed with patient? Yes  SHORT TERM GOALS:  STG Name Target Date Goal status  1 Patient to demonstrate independence in HEP  Baseline: [id number] 03/19/2021 Met  2 Initiate aquatic therapy and return for f/u on land in 3 weeks Baseline: TBD 03/19/2021 Met  3 Assess lumbar and cervical ROM  in upright position and establish goals Baseline: Cervical ROM assessed in supine 03/19/2021 Met  4 Obtain ODI score Baseline: 58% 03/19/2021 Met  LONG TERM GOALS:   LTG Name Target Date Goal status  1 Increase core/abdominal strength to 4/5 Baseline: 4-/5 on eval 04/16/2021 INITIAL  2 Increase cervical extension to 75%, R rotation to 90% Baseline: cervical extension 50%, R rotation 75% 04/16/2021 INITIAL  3 Assess progress towards lumbar ROM goals Baseline: TBD 04/16/2021 INITIAL  PLAN: PT FREQUENCY: 1x/week  PT DURATION: 8 weeks  PLANNED INTERVENTIONS: Therapeutic exercises, Therapeutic activity, Neuro Muscular re-education, Balance training, Gait training, Patient/Family education, Joint mobilization, Stair training, Aquatic Therapy, Dry Needling, Spinal mobilization, and Manual therapy  PLAN FOR NEXT SESSION: L hip pain?, HEP working?, core strengthening   Leroy Sea PT 03/01/2021, 2:49 PM

## 2021-03-09 ENCOUNTER — Other Ambulatory Visit: Payer: Self-pay

## 2021-03-09 ENCOUNTER — Ambulatory Visit (INDEPENDENT_AMBULATORY_CARE_PROVIDER_SITE_OTHER): Payer: 59 | Admitting: Internal Medicine

## 2021-03-09 ENCOUNTER — Encounter: Payer: Self-pay | Admitting: Internal Medicine

## 2021-03-09 ENCOUNTER — Telehealth: Payer: Self-pay | Admitting: Internal Medicine

## 2021-03-09 DIAGNOSIS — M5020 Other cervical disc displacement, unspecified cervical region: Secondary | ICD-10-CM | POA: Diagnosis not present

## 2021-03-09 DIAGNOSIS — M5442 Lumbago with sciatica, left side: Secondary | ICD-10-CM

## 2021-03-09 NOTE — Telephone Encounter (Signed)
Pt requesting retinol Molecular Film to be changed to Retina Cream.    States pharmacy does not have it. Pt has a picture on phone of what it should look like.    Advised pt to upload picture on MyChart, should it be deemed necessary.   [id number] [place], [place] - [address] [place] Phone:  [phone]  Fax:  [phone]

## 2021-03-09 NOTE — Assessment & Plan Note (Signed)
Using tylenol and flexeril. Needs FMLA filled out and will drop off for [place]. Continue with PT and plans to see neurosurgery in the beginning of March.

## 2021-03-09 NOTE — Telephone Encounter (Signed)
The only retinol cream in our system database is this product all others are otc and cannot be prescribed.

## 2021-03-09 NOTE — Assessment & Plan Note (Signed)
Still going to PT and will see neurosurgery beginning of March. Needs FMLA filled out. Can keep using ibuprofen/tylenol/flexeril for pain as needed.

## 2021-03-09 NOTE — Patient Instructions (Signed)
We can fill out the paperwork for you if you drop that off.

## 2021-03-09 NOTE — Progress Notes (Signed)
° °  Subjective:   Patient ID: Caroline Carlson, female    DOB: [birth year], 41 y.o.   MRN: [id number]  HPI The patient is a 41 YO female coming in for neck and back pain. Going to PT and needs dedicated time to devote to this and healing. Did stop going to work due to this and needs FMLA filled out does not have with her.   Review of Systems  Constitutional:  Positive for activity change. Negative for appetite change, chills, fatigue, fever and unexpected weight change.  Respiratory: Negative.    Cardiovascular: Negative.   Gastrointestinal: Negative.   Musculoskeletal:  Positive for arthralgias, back pain and myalgias. Negative for gait problem and joint swelling.  Skin: Negative.   Neurological: Negative.    Objective:  Physical Exam Constitutional:      Appearance: She is well-developed.  HENT:     Head: Normocephalic and atraumatic.  Cardiovascular:     Rate and Rhythm: Normal rate and regular rhythm.  Pulmonary:     Effort: Pulmonary effort is normal. No respiratory distress.     Breath sounds: Normal breath sounds. No wheezing or rales.  Abdominal:     General: Bowel sounds are normal. There is no distension.     Palpations: Abdomen is soft.     Tenderness: There is no abdominal tenderness. There is no rebound.  Musculoskeletal:        General: Tenderness present.     Cervical back: Normal range of motion.  Skin:    General: Skin is warm and dry.  Neurological:     Mental Status: She is alert and oriented to person, place, and time.     Coordination: Coordination normal.    Vitals:   03/09/21 1058  BP: 128/90  Pulse: 63  Resp: 18  SpO2: 98%  Weight: 216 lb 6.4 oz (98.2 kg)  Height: 5\' 5"  (1.651 m)    This visit occurred during the SARS-CoV-2 public health emergency.  Safety protocols were in place, including screening questions prior to the visit, additional usage of staff PPE, and extensive cleaning of exam room while observing appropriate contact time as  indicated for disinfecting solutions.   Assessment & Plan:

## 2021-03-12 ENCOUNTER — Ambulatory Visit: Payer: 59 | Admitting: Internal Medicine

## 2021-03-12 ENCOUNTER — Other Ambulatory Visit: Payer: Self-pay

## 2021-03-14 NOTE — Telephone Encounter (Signed)
LVM for pt with recommendations from Dr. Sharlet Salina  ?

## 2021-03-15 ENCOUNTER — Other Ambulatory Visit: Payer: Self-pay

## 2021-03-15 ENCOUNTER — Ambulatory Visit (HOSPITAL_BASED_OUTPATIENT_CLINIC_OR_DEPARTMENT_OTHER): Payer: 59 | Attending: Internal Medicine | Admitting: Physical Therapy

## 2021-03-15 DIAGNOSIS — M6281 Muscle weakness (generalized): Secondary | ICD-10-CM | POA: Diagnosis present

## 2021-03-15 DIAGNOSIS — M545 Low back pain, unspecified: Secondary | ICD-10-CM | POA: Insufficient documentation

## 2021-03-15 DIAGNOSIS — R293 Abnormal posture: Secondary | ICD-10-CM | POA: Insufficient documentation

## 2021-03-15 DIAGNOSIS — G8929 Other chronic pain: Secondary | ICD-10-CM | POA: Insufficient documentation

## 2021-03-15 DIAGNOSIS — M542 Cervicalgia: Secondary | ICD-10-CM | POA: Diagnosis present

## 2021-03-15 NOTE — Therapy (Signed)
OUTPATIENT PHYSICAL THERAPY TREATMENT NOTE   Patient Name: Caroline Carlson MRN: [id number] DOB:1981/01/09, 41 y.o., female Today's Date: 03/15/2021  PCP: Hoyt Koch, MD REFERRING PROVIDER: Hoyt Koch, *   PT End of Session - 03/15/21 1455     Visit Number 5    Number of Visits 8    Date for PT Re-Evaluation 04/16/21    Authorization Type [employer] MCD [id number] VL    Authorization Time Period 02/19/21-04/21/21    Progress Note Due on Visit 8    PT Start Time 1450    PT Stop Time 1530    PT Time Calculation (min) 40 min    Activity Tolerance Patient tolerated treatment well;Patient limited by pain    Behavior During Therapy [facility] for tasks assessed/performed              Past Medical History:  Diagnosis Date   Anemia    Anxiety    Back pain    Constipation    Depression    Fibromyalgia    History of blood transfusion 06/2002   [place] - unsure of number of units   Hyperlipidemia    borderline - diet controlled   Mental disorder    Currently depressed   Panic attack    PONV (postoperative nausea and vomiting)    Past Surgical History:  Procedure Laterality Date   BILATERAL SALPINGECTOMY N/A 09/25/2012   Procedure: BILATERAL SALPINGECTOMY;  Surgeon: Lahoma Crocker, MD;  Location: [facility];  Service: Gynecology;  Laterality: N/A;   CESAREAN SECTION  2002, 2004, 2006   x 3   ROBOTIC ASSISTED TOTAL HYSTERECTOMY N/A 09/25/2012   Procedure: ROBOTIC ASSISTED TOTAL HYSTERECTOMY;  Surgeon: Lahoma Crocker, MD;  Location: [facility];  Service: Gynecology;  Laterality: N/A;   TUBAL LIGATION     Patient Active Problem List   Diagnosis Date Noted   Acne 01/31/2021   Acute left-sided low back pain with left-sided sciatica 11/17/2020   Allergic rhinitis 11/17/2020   Protrusion of cervical intervertebral disc 07/07/2019   Prediabetes 05/05/2019   Elevated cholesterol 05/05/2019   Vitamin D deficiency 05/05/2019   Morbid obesity ([place]) 08/18/2013   Depression 08/14/2012     REFERRING DIAG:  M54.42 (ICD-10-CM) - Acute left-sided low back pain with left-sided sciatica M50.20 (ICD-10-CM) - Protrusion of cervical intervertebral disc   THERAPY DIAG:  Cervicalgia   Abnormal posture   Muscle weakness (generalized)   Chronic low back pain without sciatica, unspecified back pain laterality  PERTINENT HISTORY: Referral to PT for pain management as well as neurosurgery for long term strategy. She is in pain after working and for several days after. We talked about consideration of different job if this is impacting her long term health to avoid long term injury. Taking ibuprofen otc and this is helping with pain.  PRECAUTIONS: None  SUBJECTIVE: Reports only minimal pain, more soreness than anything else. "I have been doing the Plank you showed me on my sofa and I and now better at it"   PAIN:  Are you having pain? yes NPRS scale: 1/10 Pain location: neck and back Pain orientation: Bilateral  PAIN TYPE: aching, burning, and tight Pain description: constant  Aggravating factors: activity Relieving factors: undetermined   PRECAUTIONS: None  WEIGHT BEARING RESTRICTIONS No  FALLS:  Has patient fallen in last 6 months? No, Number of falls: 0  LIVING ENVIRONMENT: Lives with: lives with their family Lives in: House/apartment   OCCUPATION: forklift driver  PLOF: Independent  PATIENT GOALS:  To manage back and neck pain   OBJECTIVE:   DIAGNOSTIC FINDINGS:  IMPRESSION: Cervical spondylosis as outlined and most notably as follows.   At C4-C5, disc bulge with superimposed small central disc protrusion. Uncinate hypertrophy. The disc protrusion contacts and mildly flattens the ventral spinal cord contributing to overall mild spinal canal stenosis. Mild left neural foraminal narrowing.   At C5-C6, disc bulge with osteophyte ridge and uncinate hypertrophy contribute to mild left neural foraminal narrowing.    IMPRESSION: Early disc degeneration  as described. No neural impingement or visible inflammation.  PATIENT SURVEYS:  ODI  SCREENING FOR RED FLAGS: Bowel or bladder incontinence: No  COGNITION:  Overall cognitive status: Within functional limits for tasks assessed     SENSATION:  Light touch: Appears intact    MUSCLE LENGTH: Hamstrings: Right 80 deg; Left 80 deg  POSTURE:  Rounded shoulders  SENSATION: Light touch: Appears intact  PALPATION: Global tenderness to soft tissues of low back, neck and upper back   CERVICAL AROM/PROM  A/PROM A/PROM (deg) 02/19/2021  Flexion *75%  Extension 50%  Right lateral flexion   Left lateral flexion   Right rotation *75%  Left rotation *WFL   (* assessed in supine due to pain)  UE AROM/PROM: WFL throughout  A/PROM Right 02/19/2021 Left 02/19/2021  Shoulder flexion    Shoulder extension    Shoulder abduction    Shoulder adduction    Shoulder extension    Shoulder internal rotation    Shoulder external rotation    Elbow flexion    Elbow extension    Wrist flexion    Wrist extension    Wrist ulnar deviation    Wrist radial deviation    Wrist pronation    Wrist supination      UE MMT: BLE strength [facility]  MMT Right 02/19/2021 Left 02/19/2021  Shoulder flexion    Shoulder extension    Shoulder abduction    Shoulder adduction    Shoulder extension    Middle trapezius    Lower trapezius    Elbow flexion    Elbow extension    Wrist flexion    Wrist extension    Wrist ulnar deviation    Wrist radial deviation    Wrist pronation    Wrist supination    Grip strength      CERVICAL SPECIAL TESTS:  Cranial cervical flexion test: Negative and Distraction test: Negative  FUNCTIONAL TESTS:  Not tested   LUMBAR AROM/PROM:   A/PROM A/PROM  03/01/21  Flexion 75%  Extension 25%  Right lateral flexion 50%  Left lateral flexion 50%  Right rotation   Left rotation    (Blank rows = not tested)  LE AROM/PROM: WFL throughout  A/PROM Right 02/19/2021  Left 02/19/2021  Hip flexion    Hip extension    Hip abduction    Hip adduction    Hip internal rotation    Hip external rotation    Knee flexion    Knee extension    Ankle dorsiflexion    Ankle plantarflexion    Ankle inversion    Ankle eversion       LE MMT:  MMT Right 02/19/2021 Left 02/19/2021  Hip flexion    Hip extension    Hip abduction    Hip adduction    Hip internal rotation    Hip external rotation    Knee flexion    Knee extension    Ankle dorsiflexion    Ankle plantarflexion  Ankle inversion    Ankle eversion    Abdominal/core strength 4- 4-   (Pain limited testing accuracy)  LUMBAR SPECIAL TESTS:  Straight leg raise test: Negative and Slump test: Negative  FUNCTIONAL TESTS:  UTA due to time constraints  GAIT: Distance walked: 150 Assistive device utilized: None Level of assistance: Complete Independence Comments: slow cadence    [facility] Adult PT Treatment:                                                DATE: 03/15/21 Therapeutic Exercise: Pt seen for aquatic therapy today.  Treatment took place in water 3.25-4.8 ft in depth at the [facility] pool. Temp of water was 91.  Pt entered/exited the pool via stairs step through pattern independently with bilat rail. Reviewed current function, pain levels, response to prior Rx, and HEP compliance.     Forward, backward and side stepping. Pt without UE support.  Moving with increased cadence and agility  Plank on step hip extension R/L x10 with glute tightening with each extension ; x10 R/L using 3 lb weight Reverse plank on step hip flex x10 R/L 3lb weight. Some left anterior hip discomfort  Seated -seated on noodle: hip hiking right and left for QL stretch. Anterior/posterior pelvic tilts. R/L circular -Runners stretch:stretching hamstrings, adductors, gastroc; Piriformis and glut 3 x 20 secs.    Standing Open book R/L x10  Pt requires buoyancy for support and to offload joints with  strengthening exercises. Viscosity of the water is needed for resistance of strengthening; water current perturbations provides challenge to standing balance unsupported, requiring increased core activation.    [facility] Adult PT Treatment:                                                DATE: 03/01/21 Therapeutic Exercise: Nustep seat 8 arms 8 L1 6 min Pelvic tilt x10 Supine march with PT(needed to perform w/o tilt due to pain) Open book 10x B Curl up x10 Bridge x10 DNF x10  TREATMENT  Pt seen for aquatic therapy today.  Treatment took place in water 3.25-4.8 ft in depth at the [facility] pool. Temp of water was 91.  Pt entered/exited the pool via stairs step through pattern independently with bilat rail. Reviewed current function, pain levels, response to prior Rx, and HEP compliance.     Forward, backward and side stepping. Pt without UE support.  Moving with increased cadence and agility  Standing  -LB stretching into hip flexion 3 x 25-30 sec hold. -plank on step hip extension R/L x7 with glute tightening with each extension -mini squats 30% submerged 2x10  Seated -stretching hamstrings, adductors, gastroc; Piriformis and glut 3 x 20 secs.   -flutter kicking (SLR) at hip 3 x 20 reps -add/abd 3x20 reps -seated on noodle: hip hiking right and left for QL stretch. Anterior/posterior. R/L circular    Pt requires buoyancy for support and to offload joints with strengthening exercises. Viscosity of the water is needed for resistance of strengthening; water current perturbations provides challenge to standing balance unsupported, requiring increased core activation.     PATIENT EDUCATION:  Education details: Discussed eval findings, rehab rationale and POC and patient is in agreement  Person  educated: Patient Education method: Explanation, Demonstration, Verbal cues, and Handouts Education comprehension: verbalized understanding, returned demonstration, and needs further  education 02/28/21 Tylenol vs anti-inflammatory's    HOME EXERCISE PROGRAM: Access Code: [email or web address] URL: [email or web address] Date: 02/19/2021 Prepared by: Sharlynn Oliphant  Exercises Supine March - 2 x daily - 7 x weekly - 2 sets - 10 reps Curl Up with Arms Crossed - 2 x daily - 7 x weekly - 2 sets - 10 reps Supine Lower Trunk Rotation - 2 x daily - 7 x weekly - 1 sets - 30s hold Supine Deep Neck Flexor Training - 2 x daily - 7 x weekly - 1 sets - 10 reps - 3s hold   ASSESSMENT:  CLINICAL IMPRESSION:  Pt with reports of no hip or cervical pain just stiffness. She is compliant with HEP modifying a few of the aquatic exercises on land indep. Technique for proper positioning for execution given as well as instructing for rising from plank position without increasing back strain (Modified plank with hands on sofa). She demonstrates improved toleration to hip extension with increasing strength as she completed on land (plank). She is maintaining abdominal buckling with all exercises working on core strength with all activities. Open book completed submerged with attempts to stretch muscle and fascia although pt without toleration as it continued to cause tingling in lue. She will continue to benefit from skilled physical therapy to further improve function with decreased pain.      REHAB POTENTIAL: Good  CLINICAL DECISION MAKING: Stable/uncomplicated  EVALUATION COMPLEXITY: Moderate   GOALS: Goals reviewed with patient? Yes  SHORT TERM GOALS:  STG Name Target Date Goal status  1 Patient to demonstrate independence in HEP  Baseline: [id number] 03/19/2021 Met  2 Initiate aquatic therapy and return for f/u on land in 3 weeks Baseline: TBD 03/19/2021 Met  3 Assess lumbar and cervical ROM in upright position and establish goals Baseline: Cervical ROM assessed in supine 03/19/2021 Met  4 Obtain ODI score Baseline: 58% 03/19/2021 Met  LONG TERM GOALS:   LTG Name Target Date  Goal status  1 Increase core/abdominal strength to 4/5 Baseline: 4-/5 on eval 04/16/2021 INITIAL  2 Increase cervical extension to 75%, R rotation to 90% Baseline: cervical extension 50%, R rotation 75% 04/16/2021 INITIAL  3 Assess progress towards lumbar ROM goals Baseline: TBD 04/16/2021 INITIAL  PLAN: PT FREQUENCY: 1x/week  PT DURATION: 8 weeks  PLANNED INTERVENTIONS: Therapeutic exercises, Therapeutic activity, Neuro Muscular re-education, Balance training, Gait training, Patient/Family education, Joint mobilization, Stair training, Aquatic Therapy, Dry Needling, Spinal mobilization, and Manual therapy  PLAN FOR NEXT SESSION: L hip pain?, HEP working?, core strengthening Aquatics: core and left hip flex strengthening; prone suspension   Stanton Kidney (Frankie) Natane Heward MPT 03/15/2021, 3:46 PM

## 2021-03-19 DIAGNOSIS — M5416 Radiculopathy, lumbar region: Secondary | ICD-10-CM | POA: Diagnosis not present

## 2021-03-20 ENCOUNTER — Ambulatory Visit (HOSPITAL_BASED_OUTPATIENT_CLINIC_OR_DEPARTMENT_OTHER): Payer: 59 | Admitting: Physical Therapy

## 2021-03-21 ENCOUNTER — Encounter (HOSPITAL_BASED_OUTPATIENT_CLINIC_OR_DEPARTMENT_OTHER): Payer: Self-pay | Admitting: Physical Therapy

## 2021-03-21 ENCOUNTER — Other Ambulatory Visit: Payer: Self-pay

## 2021-03-21 ENCOUNTER — Ambulatory Visit (HOSPITAL_BASED_OUTPATIENT_CLINIC_OR_DEPARTMENT_OTHER): Payer: 59 | Admitting: Physical Therapy

## 2021-03-21 DIAGNOSIS — M6281 Muscle weakness (generalized): Secondary | ICD-10-CM

## 2021-03-21 DIAGNOSIS — G8929 Other chronic pain: Secondary | ICD-10-CM

## 2021-03-21 DIAGNOSIS — R293 Abnormal posture: Secondary | ICD-10-CM

## 2021-03-21 DIAGNOSIS — M542 Cervicalgia: Secondary | ICD-10-CM

## 2021-03-21 DIAGNOSIS — M545 Low back pain, unspecified: Secondary | ICD-10-CM

## 2021-03-21 NOTE — Therapy (Signed)
OUTPATIENT PHYSICAL THERAPY TREATMENT NOTE   Patient Name: Caroline Carlson MRN: [id number] DOB:[birth year], 41 y.o., female Today's Date: 03/21/2021  PCP: Hoyt Koch, MD REFERRING PROVIDER: Hoyt Koch, *   PT End of Session - 03/21/21 1843     Visit Number 6    Number of Visits 8    Date for PT Re-Evaluation 04/16/21    Authorization Type UHC MCD [id number] VL    Authorization Time Period 02/19/21-04/21/21    PT Start Time 1510    PT Stop Time 1550    PT Time Calculation (min) 40 min    Activity Tolerance Patient tolerated treatment well;Patient limited by pain    Behavior During Therapy [facility] for tasks assessed/performed               Past Medical History:  Diagnosis Date   Anemia    Anxiety    Back pain    Constipation    Depression    Fibromyalgia    History of blood transfusion 06/2002   [place] - unsure of number of units   Hyperlipidemia    borderline - diet controlled   Mental disorder    Currently depressed   Panic attack    PONV (postoperative nausea and vomiting)    Past Surgical History:  Procedure Laterality Date   BILATERAL SALPINGECTOMY N/A 09/25/2012   Procedure: BILATERAL SALPINGECTOMY;  Surgeon: Lahoma Crocker, MD;  Location: [facility];  Service: Gynecology;  Laterality: N/A;   CESAREAN SECTION  2002, 2004, 2006   x 3   ROBOTIC ASSISTED TOTAL HYSTERECTOMY N/A 09/25/2012   Procedure: ROBOTIC ASSISTED TOTAL HYSTERECTOMY;  Surgeon: Lahoma Crocker, MD;  Location: [facility];  Service: Gynecology;  Laterality: N/A;   TUBAL LIGATION     Patient Active Problem List   Diagnosis Date Noted   Acne 01/31/2021   Acute left-sided low back pain with left-sided sciatica 11/17/2020   Allergic rhinitis 11/17/2020   Protrusion of cervical intervertebral disc 07/07/2019   Prediabetes 05/05/2019   Elevated cholesterol 05/05/2019   Vitamin D deficiency 05/05/2019   Morbid obesity ([place]) 08/18/2013   Depression 08/14/2012    REFERRING DIAG:  M54.42  (ICD-10-CM) - Acute left-sided low back pain with left-sided sciatica M50.20 (ICD-10-CM) - Protrusion of cervical intervertebral disc   THERAPY DIAG:  Cervicalgia   Abnormal posture   Muscle weakness (generalized)   Chronic low back pain without sciatica, unspecified back pain laterality  PERTINENT HISTORY: Referral to PT for pain management as well as neurosurgery for long term strategy. She is in pain after working and for several days after. We talked about consideration of different job if this is impacting her long term health to avoid long term injury. Taking ibuprofen otc and this is helping with pain.  PRECAUTIONS: None  SUBJECTIVE: Left side lumbar spine discomfort today. Since injection on Monday pt reports improvement in overall pain.  Complains of headache.    PAIN:  Are you having pain? yes NPRS scale: 5/10 Pain location: neck and back Pain orientation: Bilateral  PAIN TYPE: aching, burning, and tight Pain description: constant  Aggravating factors: activity Relieving factors: undetermined   PRECAUTIONS: None  WEIGHT BEARING RESTRICTIONS No  FALLS:  Has patient fallen in last 6 months? No, Number of falls: 0  LIVING ENVIRONMENT: Lives with: lives with their family Lives in: House/apartment   OCCUPATION: forklift driver  PLOF: Independent  PATIENT GOALS: To manage back and neck pain   OBJECTIVE:   DIAGNOSTIC FINDINGS:  IMPRESSION:  Cervical spondylosis as outlined and most notably as follows.   At C4-C5, disc bulge with superimposed small central disc protrusion. Uncinate hypertrophy. The disc protrusion contacts and mildly flattens the ventral spinal cord contributing to overall mild spinal canal stenosis. Mild left neural foraminal narrowing.   At C5-C6, disc bulge with osteophyte ridge and uncinate hypertrophy contribute to mild left neural foraminal narrowing.    IMPRESSION: Early disc degeneration as described. No neural impingement  or visible inflammation.  PATIENT SURVEYS:  ODI  SCREENING FOR RED FLAGS: Bowel or bladder incontinence: No  COGNITION:  Overall cognitive status: Within functional limits for tasks assessed     SENSATION:  Light touch: Appears intact    MUSCLE LENGTH: Hamstrings: Right 80 deg; Left 80 deg  POSTURE:  Rounded shoulders  SENSATION: Light touch: Appears intact  PALPATION: Global tenderness to soft tissues of low back, neck and upper back   CERVICAL AROM/PROM  A/PROM A/PROM (deg) 02/19/2021  Flexion *75%  Extension 50%  Right lateral flexion   Left lateral flexion   Right rotation *75%  Left rotation *WFL   (* assessed in supine due to pain)  UE AROM/PROM: WFL throughout  A/PROM Right 02/19/2021 Left 02/19/2021  Shoulder flexion    Shoulder extension    Shoulder abduction    Shoulder adduction    Shoulder extension    Shoulder internal rotation    Shoulder external rotation    Elbow flexion    Elbow extension    Wrist flexion    Wrist extension    Wrist ulnar deviation    Wrist radial deviation    Wrist pronation    Wrist supination      UE MMT: BLE strength [facility]  MMT Right 02/19/2021 Left 02/19/2021  Shoulder flexion    Shoulder extension    Shoulder abduction    Shoulder adduction    Shoulder extension    Middle trapezius    Lower trapezius    Elbow flexion    Elbow extension    Wrist flexion    Wrist extension    Wrist ulnar deviation    Wrist radial deviation    Wrist pronation    Wrist supination    Grip strength      CERVICAL SPECIAL TESTS:  Cranial cervical flexion test: Negative and Distraction test: Negative  FUNCTIONAL TESTS:  Not tested   LUMBAR AROM/PROM:   A/PROM A/PROM  03/01/21  Flexion 75%  Extension 25%  Right lateral flexion 50%  Left lateral flexion 50%  Right rotation   Left rotation    (Blank rows = not tested)  LE AROM/PROM: WFL throughout  A/PROM Right 02/19/2021 Left 02/19/2021  Hip flexion    Hip  extension    Hip abduction    Hip adduction    Hip internal rotation    Hip external rotation    Knee flexion    Knee extension    Ankle dorsiflexion    Ankle plantarflexion    Ankle inversion    Ankle eversion       LE MMT:  MMT Right 02/19/2021 Left 02/19/2021  Hip flexion    Hip extension    Hip abduction    Hip adduction    Hip internal rotation    Hip external rotation    Knee flexion    Knee extension    Ankle dorsiflexion    Ankle plantarflexion    Ankle inversion    Ankle eversion    Abdominal/core strength 4- 4-   (  Pain limited testing accuracy)  LUMBAR SPECIAL TESTS:  Straight leg raise test: Negative and Slump test: Negative  FUNCTIONAL TESTS:  UTA due to time constraints  GAIT: Distance walked: 150 Assistive device utilized: None Level of assistance: Complete Independence Comments: slow cadence   [facility] Adult PT Treatment:                                                DATE: 03/21/21 Therapeutic Exercise: Pt seen for aquatic therapy today.  Treatment took place in water 3.25-4.8 ft in depth at the [facility] pool. Temp of water was 91.  Pt entered/exited the pool via stairs step through pattern independently with bilat rail. Reviewed current function, pain levels, response to prior Rx, and HEP compliance.     Forward, backward and side stepping. Pt without UE support.   Plank on step hip extension R/L x10 with glute tightening with each extension ; x10 R/L using 3 lb weight Reverse plank on step hip flex x10 R/L 3lb weight.   Using yellow noodle for support and 3lb ankle weights: Cues for abdominal bracing Pilates plank 3x30s hold Plank with arm lifts 2x10  Standing IT band stretch x 20s R/L Figure 4 piriformis stretch 2x20s R/L  Seated  on noodle: hip hiking right and left for QL stretch. Anterior/posterior pelvic tilts. R/L circular  Pt requires buoyancy for support and to offload joints with strengthening exercises. Viscosity of the  water is needed for resistance of strengthening; water current perturbations provides challenge to standing balance unsupported, requiring increased core activation.   [facility] Adult PT Treatment:                                                DATE: 03/15/21 Therapeutic Exercise: Pt seen for aquatic therapy today.  Treatment took place in water 3.25-4.8 ft in depth at the [facility] pool. Temp of water was 91.  Pt entered/exited the pool via stairs step through pattern independently with bilat rail. Reviewed current function, pain levels, response to prior Rx, and HEP compliance.     Forward, backward and side stepping. Pt without UE support.  Moving with increased cadence and agility  Plank on step hip extension R/L x10 with glute tightening with each extension ; x10 R/L using 3 lb weight Reverse plank on step hip flex x10 R/L 3lb weight. Some left anterior hip discomfort  Seated -seated on noodle: hip hiking right and left for QL stretch. Anterior/posterior pelvic tilts. R/L circular -Runners stretch:stretching hamstrings, adductors, gastroc; Piriformis and glut 3 x 20 secs.    Standing Open book R/L x10  Pt requires buoyancy for support and to offload joints with strengthening exercises. Viscosity of the water is needed for resistance of strengthening; water current perturbations provides challenge to standing balance unsupported, requiring increased core activation.    [facility] Adult PT Treatment:                                                DATE: 03/01/21 Therapeutic Exercise: Nustep seat 8 arms 8 L1 6 min Pelvic tilt x10 Supine  march with PT(needed to perform w/o tilt due to pain) Open book 10x B Curl up x10 Bridge x10 DNF x10  TREATMENT  Pt seen for aquatic therapy today.  Treatment took place in water 3.25-4.8 ft in depth at the [facility] pool. Temp of water was 91.  Pt entered/exited the pool via stairs step through pattern independently with bilat  rail. Reviewed current function, pain levels, response to prior Rx, and HEP compliance.     Forward, backward and side stepping. Pt without UE support.    Standing  -LB stretching into hip flexion 3 x 25-30 sec hold. -plank on step hip extension R/L x7 with glute tightening with each extension -mini squats 30% submerged 2x10  Seated -stretching hamstrings, adductors, gastroc; Piriformis and glut 3 x 20 secs.   -flutter kicking (SLR) at hip 3 x 20 reps -add/abd 3x20 reps -seated on noodle: hip hiking right and left for QL stretch. Anterior/posterior. R/L circular    Pt requires buoyancy for support and to offload joints with strengthening exercises. Viscosity of the water is needed for resistance of strengthening; water current perturbations provides challenge to standing balance unsupported, requiring increased core activation.     PATIENT EDUCATION:  Education details: Discussed eval findings, rehab rationale and POC and patient is in agreement  Person educated: Patient Education method: Explanation, Demonstration, Verbal cues, and Handouts Education comprehension: verbalized understanding, returned demonstration, and needs further education 02/28/21 Tylenol vs anti-inflammatory's    HOME EXERCISE PROGRAM: Access Code: [email or web address] URL: [email or web address] Date: 02/19/2021 Prepared by: Sharlynn Oliphant  Exercises Supine March - 2 x daily - 7 x weekly - 2 sets - 10 reps Curl Up with Arms Crossed - 2 x daily - 7 x weekly - 2 sets - 10 reps Supine Lower Trunk Rotation - 2 x daily - 7 x weekly - 1 sets - 30s hold Supine Deep Neck Flexor Training - 2 x daily - 7 x weekly - 1 sets - 10 reps - 3s hold   ASSESSMENT:  CLINICAL IMPRESSION: Pt late for appointment limiting time. Injection in lumbar spine Monday.  Pt has been out of work since Feb 12.  All of her pain symptoms have significantly decreased since. Pt with full ROM cervical spine. She reports walking through  [employer] over weekend tolerating activity for ~ 1 hour. Progressed core strengthening using Pilates techniques. She tolerates well without increase in sx. Gait wfl.  She has 1 more aquatic session left.  Will instruct on HEP in event pt has access to pool for ongoing management of condition.     REHAB POTENTIAL: Good  CLINICAL DECISION MAKING: Stable/uncomplicated  EVALUATION COMPLEXITY: Moderate   GOALS: Goals reviewed with patient? Yes  SHORT TERM GOALS:  STG Name Target Date Goal status  1 Patient to demonstrate independence in HEP  Baseline: [id number] 03/19/2021 Met  2 Initiate aquatic therapy and return for f/u on land in 3 weeks Baseline: TBD 03/19/2021 Met  3 Assess lumbar and cervical ROM in upright position and establish goals Baseline: Cervical ROM assessed in supine 03/19/2021 Met  4 Obtain ODI score Baseline: 58% 03/19/2021 Met  LONG TERM GOALS:   LTG Name Target Date Goal status  1 Increase core/abdominal strength to 4/5 Baseline: 4-/5 on eval 04/16/2021 INITIAL  2 Increase cervical extension to 75%, R rotation to 90% Baseline: cervical extension 50%, R rotation 75% 04/16/2021 INITIAL  3 Assess progress towards lumbar ROM goals Baseline: TBD 04/16/2021 INITIAL  PLAN: PT FREQUENCY: 1x/week  PT  DURATION: 8 weeks  PLANNED INTERVENTIONS: Therapeutic exercises, Therapeutic activity, Neuro Muscular re-education, Balance training, Gait training, Patient/Family education, Joint mobilization, Stair training, Aquatic Therapy, Dry Needling, Spinal mobilization, and Manual therapy  PLAN FOR NEXT SESSION: L hip pain?, HEP working?, core strengthening Aquatics: core and left hip flex strengthening; prone suspension   Stanton Kidney (Frankie) Akayla Brass MPT 03/21/2021, 6:45 PM

## 2021-03-23 ENCOUNTER — Encounter: Payer: Self-pay | Admitting: Internal Medicine

## 2021-03-23 ENCOUNTER — Ambulatory Visit (INDEPENDENT_AMBULATORY_CARE_PROVIDER_SITE_OTHER): Payer: 59 | Admitting: Internal Medicine

## 2021-03-23 ENCOUNTER — Other Ambulatory Visit: Payer: Self-pay

## 2021-03-23 DIAGNOSIS — M5020 Other cervical disc displacement, unspecified cervical region: Secondary | ICD-10-CM

## 2021-03-23 DIAGNOSIS — M5442 Lumbago with sciatica, left side: Secondary | ICD-10-CM | POA: Diagnosis not present

## 2021-03-23 MED ORDER — TRETINOIN 0.1 % EX CREA: TOPICAL_CREAM | Freq: Every day | CUTANEOUS | 0 refills | Status: DC

## 2021-03-23 NOTE — Patient Instructions (Signed)
We have faxed the forms for you. ?

## 2021-03-23 NOTE — Assessment & Plan Note (Signed)
She will continue with PT. Taking otc and flexeril for pain. Overall improving but still sore after PT and having some intermittent headaches after PT. ?

## 2021-03-23 NOTE — Progress Notes (Signed)
? ?  Subjective:  ? ?Patient ID: NECOLE MINASSIAN, female    DOB: 19-May-1980, 41 y.o.   MRN: [id number] ? ?HPI ?The patient is a 41 YO female coming in for STD/FMLA paperwork about back/neck pain. ? ?Review of Systems  ?Constitutional:  Positive for activity change. Negative for appetite change, chills, fatigue, fever and unexpected weight change.  ?Respiratory: Negative.    ?Cardiovascular: Negative.   ?Gastrointestinal: Negative.   ?Musculoskeletal:  Positive for arthralgias, back pain and myalgias. Negative for gait problem and joint swelling.  ?Skin: Negative.   ?Neurological: Negative.   ? ?Objective:  ?Physical Exam ?Constitutional:   ?   Appearance: She is well-developed.  ?HENT:  ?   Head: Normocephalic and atraumatic.  ?Cardiovascular:  ?   Rate and Rhythm: Normal rate and regular rhythm.  ?Pulmonary:  ?   Effort: Pulmonary effort is normal. No respiratory distress.  ?   Breath sounds: Normal breath sounds. No wheezing or rales.  ?Abdominal:  ?   General: Bowel sounds are normal. There is no distension.  ?   Palpations: Abdomen is soft.  ?   Tenderness: There is no abdominal tenderness. There is no rebound.  ?Musculoskeletal:     ?   General: Tenderness present.  ?   Cervical back: Normal range of motion.  ?Skin: ?   General: Skin is warm and dry.  ?Neurological:  ?   Mental Status: She is alert and oriented to person, place, and time.  ?   Coordination: Coordination normal.  ? ? ?Vitals:  ? 03/23/21 1355  ?BP: 134/82  ?Pulse: 82  ?Temp: 98.4 ?F (36.9 ?C)  ?TempSrc: Oral  ?SpO2: 93%  ?Weight: 222 lb 4 oz (100.8 kg)  ?Height: '5\' 5"'$  (1.651 m)  ? ? ?This visit occurred during the SARS-CoV-2 public health emergency.  Safety protocols were in place, including screening questions prior to the visit, additional usage of staff PPE, and extensive cleaning of exam room while observing appropriate contact time as indicated for disinfecting solutions.  ? ?Assessment & Plan:  ? ?

## 2021-03-23 NOTE — Assessment & Plan Note (Signed)
Continue with PT. Reviewed those notes today and she is making good progress with reduction of pain and increased strength, they recommend to continue 1 session weekly. Filled out STD forms today for return to work 04/02/21 which will allow another week for her injection in the low back to fully take effect before return to work.  ?

## 2021-03-26 ENCOUNTER — Ambulatory Visit: Payer: 59 | Admitting: Internal Medicine

## 2021-03-27 ENCOUNTER — Ambulatory Visit (HOSPITAL_BASED_OUTPATIENT_CLINIC_OR_DEPARTMENT_OTHER): Payer: 59 | Admitting: Physical Therapy

## 2021-03-27 ENCOUNTER — Telehealth: Payer: Self-pay | Admitting: Internal Medicine

## 2021-03-27 ENCOUNTER — Other Ambulatory Visit: Payer: Self-pay

## 2021-03-27 ENCOUNTER — Encounter (HOSPITAL_BASED_OUTPATIENT_CLINIC_OR_DEPARTMENT_OTHER): Payer: Self-pay | Admitting: Physical Therapy

## 2021-03-27 DIAGNOSIS — M542 Cervicalgia: Secondary | ICD-10-CM

## 2021-03-27 DIAGNOSIS — R293 Abnormal posture: Secondary | ICD-10-CM | POA: Diagnosis not present

## 2021-03-27 DIAGNOSIS — M545 Low back pain, unspecified: Secondary | ICD-10-CM

## 2021-03-27 DIAGNOSIS — M6281 Muscle weakness (generalized): Secondary | ICD-10-CM

## 2021-03-27 NOTE — Therapy (Signed)
?OUTPATIENT PHYSICAL THERAPY TREATMENT NOTE ? ? ?Patient Name: Caroline Carlson ?MRN: [id number] ?DOB:[birth year], 41 y.o., female ?Today's Date: 03/27/2021 ? ?PCP: Hoyt Koch, MD ?REFERRING PROVIDER: Hoyt Koch, * ? ? PT End of Session - 03/27/21 1550   ? ? Visit Number 7   ? Number of Visits 8   ? Date for PT Re-Evaluation 04/16/21   ? Authorization Type UHC MCD 27 VL   ? Authorization Time Period 02/19/21-04/21/21   ? PT Start Time 1540   ? PT Stop Time 1625   ? PT Time Calculation (min) 45 min   ? Activity Tolerance Patient tolerated treatment well;Patient limited by pain   ? Behavior During Therapy [facility] for tasks assessed/performed   ? ?  ?  ? ?  ? ? ? ? ?Past Medical History:  ?Diagnosis Date  ? Anemia   ? Anxiety   ? Back pain   ? Constipation   ? Depression   ? Fibromyalgia   ? History of blood transfusion 06/2002  ? [place] - unsure of number of units  ? Hyperlipidemia   ? borderline - diet controlled  ? Mental disorder   ? Currently depressed  ? Panic attack   ? PONV (postoperative nausea and vomiting)   ? ?Past Surgical History:  ?Procedure Laterality Date  ? BILATERAL SALPINGECTOMY N/A 09/25/2012  ? Procedure: BILATERAL SALPINGECTOMY;  Surgeon: Lahoma Crocker, MD;  Location: [facility];  Service: Gynecology;  Laterality: N/A;  ? CESAREAN SECTION  2002, 2004, 2006  ? x 3  ? ROBOTIC ASSISTED TOTAL HYSTERECTOMY N/A 09/25/2012  ? Procedure: ROBOTIC ASSISTED TOTAL HYSTERECTOMY;  Surgeon: Lahoma Crocker, MD;  Location: [facility];  Service: Gynecology;  Laterality: N/A;  ? TUBAL LIGATION    ? ?Patient Active Problem List  ? Diagnosis Date Noted  ? Acne 01/31/2021  ? Acute left-sided low back pain with left-sided sciatica 11/17/2020  ? Allergic rhinitis 11/17/2020  ? Protrusion of cervical intervertebral disc 07/07/2019  ? Prediabetes 05/05/2019  ? Elevated cholesterol 05/05/2019  ? Vitamin D deficiency 05/05/2019  ? Morbid obesity ([place]) 08/18/2013  ? Depression 08/14/2012  ? ? ?REFERRING DIAG:  M54.42  (ICD-10-CM) - Acute left-sided low back pain with left-sided sciatica M50.20 (ICD-10-CM) - Protrusion of cervical intervertebral disc  ? ?THERAPY DIAG:  ?Cervicalgia ?  ?Abnormal posture ?  ?Muscle weakness (generalized) ?  ?Chronic low back pain without sciatica, unspecified back pain laterality ? ?PERTINENT HISTORY: Referral to PT for pain management as well as neurosurgery for long term strategy. She is in pain after working and for several days after. We talked about consideration of different job if this is impacting her long term health to avoid long term injury. Taking ibuprofen otc and this is helping with pain. ? ?PRECAUTIONS: None ? ?SUBJECTIVE: "doing well, pain low" ? ? ? ?PAIN:  ?Are you having pain? yes ?NPRS scale: 1-2/10 ?Pain location: neck and back ?Pain orientation: Bilateral  ?PAIN TYPE: aching, burning, and tight ?Pain description: constant  ?Aggravating factors: activity ?Relieving factors: undetermined  ? ?PRECAUTIONS: None ? ?WEIGHT BEARING RESTRICTIONS No ? ?FALLS:  ?Has patient fallen in last 6 months? No, Number of falls: 0 ? ?LIVING ENVIRONMENT: ?Lives with: lives with their family ?Lives in: House/apartment ? ? ?OCCUPATION: forklift driver ? ?PLOF: Independent ? ?PATIENT GOALS: To manage back and neck pain ? ? ?OBJECTIVE:  ? ?DIAGNOSTIC FINDINGS:  ?IMPRESSION: ?Cervical spondylosis as outlined and most notably as follows. ?  ?At C4-C5, disc bulge with  superimposed small central disc ?protrusion. Uncinate hypertrophy. The disc protrusion contacts and ?mildly flattens the ventral spinal cord contributing to overall mild ?spinal canal stenosis. Mild left neural foraminal narrowing. ?  ?At C5-C6, disc bulge with osteophyte ridge and uncinate hypertrophy ?contribute to mild left neural foraminal narrowing. ?  ? IMPRESSION: ?Early disc degeneration as described. No neural impingement or ?visible inflammation. ? ?PATIENT SURVEYS:  ?ODI ? ?SCREENING FOR RED FLAGS: ?Bowel or bladder incontinence:  No ? ?COGNITION: ? Overall cognitive status: Within functional limits for tasks assessed   ?  ?SENSATION: ? Light touch: Appears intact ?  ? ?MUSCLE LENGTH: ?Hamstrings: Right 80 deg; Left 80 deg ? ?POSTURE:  ?Rounded shoulders ? ?SENSATION: ?Light touch: Appears intact ? ?PALPATION: ?Global tenderness to soft tissues of low back, neck and upper back  ? ?CERVICAL AROM/PROM ? ?A/PROM A/PROM (deg) ?02/19/2021  ?Flexion *75%  ?Extension 50%  ?Right lateral flexion   ?Left lateral flexion   ?Right rotation *75%  ?Left rotation *WFL  ? (* assessed in supine due to pain) ? ?UE AROM/PROM: WFL throughout ? ?A/PROM Right ?02/19/2021 Left ?02/19/2021  ?Shoulder flexion    ?Shoulder extension    ?Shoulder abduction    ?Shoulder adduction    ?Shoulder extension    ?Shoulder internal rotation    ?Shoulder external rotation    ?Elbow flexion    ?Elbow extension    ?Wrist flexion    ?Wrist extension    ?Wrist ulnar deviation    ?Wrist radial deviation    ?Wrist pronation    ?Wrist supination    ?  ?UE MMT: BLE strength WFL ? ?MMT Right ?02/19/2021 Left ?02/19/2021  ?Shoulder flexion    ?Shoulder extension    ?Shoulder abduction    ?Shoulder adduction    ?Shoulder extension    ?Middle trapezius    ?Lower trapezius    ?Elbow flexion    ?Elbow extension    ?Wrist flexion    ?Wrist extension    ?Wrist ulnar deviation    ?Wrist radial deviation    ?Wrist pronation    ?Wrist supination    ?Grip strength    ? ? ?CERVICAL SPECIAL TESTS:  ?Cranial cervical flexion test: Negative and Distraction test: Negative ? ?FUNCTIONAL TESTS:  ?Not tested  ? ?LUMBAR AROM/PROM:  ? ?A/PROM A/PROM  ?03/01/21  ?Flexion 75%  ?Extension 25%  ?Right lateral flexion 50%  ?Left lateral flexion 50%  ?Right rotation   ?Left rotation   ? (Blank rows = not tested) ? ?LE AROM/PROM: WFL throughout ? ?A/PROM Right ?02/19/2021 Left ?02/19/2021  ?Hip flexion    ?Hip extension    ?Hip abduction    ?Hip adduction    ?Hip internal rotation    ?Hip external rotation    ?Knee flexion     ?Knee extension    ?Ankle dorsiflexion    ?Ankle plantarflexion    ?Ankle inversion    ?Ankle eversion    ?  ? ?LE MMT: ? ?MMT Right ?02/19/2021 Left ?02/19/2021  ?Hip flexion    ?Hip extension    ?Hip abduction    ?Hip adduction    ?Hip internal rotation    ?Hip external rotation    ?Knee flexion    ?Knee extension    ?Ankle dorsiflexion    ?Ankle plantarflexion    ?Ankle inversion    ?Ankle eversion    ?Abdominal/core strength 4- 4-  ? (Pain limited testing accuracy) ? ?LUMBAR SPECIAL TESTS:  ?Straight leg raise test: Negative and  Slump test: Negative ? ?FUNCTIONAL TESTS:  ?UTA due to time constraints ? ?GAIT: ?Distance walked: 150 ?Assistive device utilized: None ?Level of assistance: Complete Independence ?Comments: slow cadence ? ? ?[facility] Adult PT Treatment:                                                DATE: 03/27/21 ?Therapeutic Exercise: ?Pt seen for aquatic therapy today.  Treatment took place in water 3.25-4.8 ft in depth at the [facility] pool. Temp of water was 91?.  Pt entered/exited the pool via stairs step through pattern independently with bilat rail. ?Reviewed current function, pain levels, response to prior Rx, and HEP compliance.   ? ? Forward, backward and side stepping. Pt without UE support.  ? ?Seated ?on noodle: hip hiking right and left for QL stretch. Anterior/posterior pelvic tilts. R/L circular ?3rd step: flutter 3x20;add/abd 3x20 ? ?Plank on step hip extension R/L x10 with glute tightening with each extension  ?Reverse plank on step hip flex x10 R/L ? ?Standing ?-Runners stretch:stretching hamstrings, adductors, gastroc; Piriformis and glut 3 x 20 secs.   ?-mini squats x10 submerged 50% ?Using yellow noodle for support and 3lb ankle weights: Cues for abdominal bracing ?Plank with arm lifts 2x10 ? ? Pt requires buoyancy for support and to offload joints with strengthening exercises. Viscosity of the water is needed for resistance of strengthening; water current perturbations  provides challenge to standing balance unsupported, requiring increased core activation. ? ? ? ? ? ? [place] Adult PT Treatment:                                                DATE: 03/01/21 ?Therapeutic Exercise:

## 2021-03-27 NOTE — Telephone Encounter (Signed)
Patient calling in ? ?Patient says paperwork that was faxed over to her employer is missing a side.. the side that has the dates that she is currently out of work is missing & they are unable to process her claim w/ out it ? ?Please refax.. pstient also requesting a copy be emailed to her: [email or web address] or upload via mychart ?

## 2021-03-29 ENCOUNTER — Other Ambulatory Visit: Payer: Self-pay

## 2021-03-29 ENCOUNTER — Ambulatory Visit: Payer: 59 | Attending: Internal Medicine

## 2021-03-29 ENCOUNTER — Telehealth: Payer: Self-pay | Admitting: Internal Medicine

## 2021-03-29 DIAGNOSIS — M545 Low back pain, unspecified: Secondary | ICD-10-CM | POA: Diagnosis present

## 2021-03-29 DIAGNOSIS — M542 Cervicalgia: Secondary | ICD-10-CM | POA: Diagnosis present

## 2021-03-29 DIAGNOSIS — M6281 Muscle weakness (generalized): Secondary | ICD-10-CM | POA: Diagnosis not present

## 2021-03-29 DIAGNOSIS — G8929 Other chronic pain: Secondary | ICD-10-CM | POA: Insufficient documentation

## 2021-03-29 NOTE — Telephone Encounter (Signed)
Pt states pharmacy informed her a PA is needed for tretinoin (RETIN-A) 0.1 % cream ?

## 2021-03-29 NOTE — Therapy (Signed)
?OUTPATIENT PHYSICAL THERAPY TREATMENT NOTE/DC SUMMARY ? ? ?Patient Name: Caroline Carlson ?MRN: [id number] ?DOB:18-Nov-1980, 41 y.o., female ?Today's Date: 03/29/2021 ? ?PCP: Hoyt Koch, MD ?REFERRING PROVIDER: Hoyt Koch, * ?PHYSICAL THERAPY DISCHARGE SUMMARY ? ?Visits from Start of Care: 8 ? ?Current functional level related to goals / functional outcomes: ?Goals met ?  ?Remaining deficits: ?Mild ROM deficits and pain levels ?  ?Education / Equipment: ?HEP  ? ?Patient agrees to discharge. Patient goals were met. Patient is being discharged due to meeting the stated rehab goals.  ? PT End of Session - 03/29/21 1618   ? ? Visit Number 8   ? Number of Visits 8   ? Date for PT Re-Evaluation 04/16/21   ? Authorization Type [employer] MCD 27 VL   ? Authorization Time Period 02/19/21-04/21/21   ? Progress Note Due on Visit 8   ? PT Start Time 1620   ? PT Stop Time 1700   ? PT Time Calculation (min) 40 min   ? Activity Tolerance Patient tolerated treatment well;Patient limited by pain   ? Behavior During Therapy [facility] for tasks assessed/performed   ? ?  ?  ? ?  ? ? ? ? ? ?Past Medical History:  ?Diagnosis Date  ? Anemia   ? Anxiety   ? Back pain   ? Constipation   ? Depression   ? Fibromyalgia   ? History of blood transfusion 06/2002  ? [place] - unsure of number of units  ? Hyperlipidemia   ? borderline - diet controlled  ? Mental disorder   ? Currently depressed  ? Panic attack   ? PONV (postoperative nausea and vomiting)   ? ?Past Surgical History:  ?Procedure Laterality Date  ? BILATERAL SALPINGECTOMY N/A 09/25/2012  ? Procedure: BILATERAL SALPINGECTOMY;  Surgeon: Lahoma Crocker, MD;  Location: [facility];  Service: Gynecology;  Laterality: N/A;  ? CESAREAN SECTION  2002, 2004, 2006  ? x 3  ? ROBOTIC ASSISTED TOTAL HYSTERECTOMY N/A 09/25/2012  ? Procedure: ROBOTIC ASSISTED TOTAL HYSTERECTOMY;  Surgeon: Lahoma Crocker, MD;  Location: [facility];  Service: Gynecology;  Laterality: N/A;  ? TUBAL LIGATION    ? ?Patient  Active Problem List  ? Diagnosis Date Noted  ? Acne 01/31/2021  ? Acute left-sided low back pain with left-sided sciatica 11/17/2020  ? Allergic rhinitis 11/17/2020  ? Protrusion of cervical intervertebral disc 07/07/2019  ? Prediabetes 05/05/2019  ? Elevated cholesterol 05/05/2019  ? Vitamin D deficiency 05/05/2019  ? Morbid obesity ([place]) 08/18/2013  ? Depression 08/14/2012  ? ? ?REFERRING DIAG:  M54.42 (ICD-10-CM) - Acute left-sided low back pain with left-sided sciatica M50.20 (ICD-10-CM) - Protrusion of cervical intervertebral disc  ? ?THERAPY DIAG:  ?Cervicalgia ?  ?Abnormal posture ?  ?Muscle weakness (generalized) ?  ?Chronic low back pain without sciatica, unspecified back pain laterality ? ?PERTINENT HISTORY: Referral to PT for pain management as well as neurosurgery for long term strategy. She is in pain after working and for several days after. We talked about consideration of different job if this is impacting her long term health to avoid long term injury. Taking ibuprofen otc and this is helping with pain. ? ?PRECAUTIONS: None ? ?SUBJECTIVE: Reports no pain ? ? ? ?PAIN:  ?Are you having pain? yes ?NPRS scale: 1-2/10 ?Pain location: neck and back ?Pain orientation: Bilateral  ?PAIN TYPE: aching, burning, and tight ?Pain description: constant  ?Aggravating factors: activity ?Relieving factors: undetermined  ? ?PRECAUTIONS: None ? ?WEIGHT BEARING RESTRICTIONS  No ? ?FALLS:  ?Has patient fallen in last 6 months? No, Number of falls: 0 ? ?LIVING ENVIRONMENT: ?Lives with: lives with their family ?Lives in: House/apartment ? ? ?OCCUPATION: forklift driver ? ?PLOF: Independent ? ?PATIENT GOALS: To manage back and neck pain ? ? ?OBJECTIVE:  ? ?DIAGNOSTIC FINDINGS:  ?IMPRESSION: ?Cervical spondylosis as outlined and most notably as follows. ?  ?At C4-C5, disc bulge with superimposed small central disc ?protrusion. Uncinate hypertrophy. The disc protrusion contacts and ?mildly flattens the ventral spinal cord  contributing to overall mild ?spinal canal stenosis. Mild left neural foraminal narrowing. ?  ?At C5-C6, disc bulge with osteophyte ridge and uncinate hypertrophy ?contribute to mild left neural foraminal narrowing. ?  ? IMPRESSION: ?Early disc degeneration as described. No neural impingement or ?visible inflammation. ? ?PATIENT SURVEYS:  ?ODI ? ?SCREENING FOR RED FLAGS: ?Bowel or bladder incontinence: No ? ?COGNITION: ? Overall cognitive status: Within functional limits for tasks assessed   ?  ?SENSATION: ? Light touch: Appears intact ?  ? ?MUSCLE LENGTH: ?Hamstrings: Right 80 deg; Left 80 deg ? ?POSTURE:  ?Rounded shoulders ? ?SENSATION: ?Light touch: Appears intact ? ?PALPATION: ?Global tenderness to soft tissues of low back, neck and upper back  ? ?CERVICAL AROM/PROM ? ?A/PROM A/PROM (deg) ?03/29/2021  ?Flexion 90%  ?Extension 75%  ?Right lateral flexion 75%  ?Left lateral flexion 75%  ?Right rotation 90%  ?Left rotation 90%  ?  ? ?UE AROM/PROM: WFL throughout ? ?A/PROM Right ?02/19/2021 Left ?02/19/2021  ?Shoulder flexion    ?Shoulder extension    ?Shoulder abduction    ?Shoulder adduction    ?Shoulder extension    ?Shoulder internal rotation    ?Shoulder external rotation    ?Elbow flexion    ?Elbow extension    ?Wrist flexion    ?Wrist extension    ?Wrist ulnar deviation    ?Wrist radial deviation    ?Wrist pronation    ?Wrist supination    ?  ?UE MMT: BLE strength WFL ? ?MMT Right ?02/19/2021 Left ?02/19/2021  ?Shoulder flexion    ?Shoulder extension    ?Shoulder abduction    ?Shoulder adduction    ?Shoulder extension    ?Middle trapezius    ?Lower trapezius    ?Elbow flexion    ?Elbow extension    ?Wrist flexion    ?Wrist extension    ?Wrist ulnar deviation    ?Wrist radial deviation    ?Wrist pronation    ?Wrist supination    ?Grip strength    ? ? ?CERVICAL SPECIAL TESTS:  ?Cranial cervical flexion test: Negative and Distraction test: Negative ? ?FUNCTIONAL TESTS:  ?Not tested  ? ?LUMBAR AROM/PROM:  ? ?A/PROM  A/PROM  ?03/01/21  ?Flexion 90%  ?Extension 75%  ?Right lateral flexion 75%  ?Left lateral flexion 75%  ?Right rotation   ?Left rotation   ? (Blank rows = not tested) ? ?LE AROM/PROM: WFL throughout ? ?A/PROM Right ?02/19/2021 Left ?02/19/2021  ?Hip flexion    ?Hip extension    ?Hip abduction    ?Hip adduction    ?Hip internal rotation    ?Hip external rotation    ?Knee flexion    ?Knee extension    ?Ankle dorsiflexion    ?Ankle plantarflexion    ?Ankle inversion    ?Ankle eversion    ?  ? ?LE MMT: ? ?MMT Right ?02/19/2021 Left ?02/19/2021  ?Hip flexion    ?Hip extension    ?Hip abduction    ?Hip adduction    ?  Hip internal rotation    ?Hip external rotation    ?Knee flexion    ?Knee extension    ?Ankle dorsiflexion    ?Ankle plantarflexion    ?Ankle inversion    ?Ankle eversion    ?Abdominal/core strength 4 4  ? (Pain limited testing accuracy) ? ?LUMBAR SPECIAL TESTS:  ?Straight leg raise test: Negative and Slump test: Negative ? ?FUNCTIONAL TESTS:  ?UTA due to time constraints ? ?GAIT: ?Distance walked: 150 ?Assistive device utilized: None ?Level of assistance: Complete Independence ?Comments: slow cadence ? ?[facility] Adult PT Treatment:                                                DATE: 03/29/21 ?Therapeutic Exercise: ?Curl Up with Arms Crossed - 2 x daily - 7 x weekly - 2 sets - 10 reps ?Supine Deep Neck Flexor Training - 2 x daily - 7 x weekly - 1 sets - 10 reps - 3s hold ?Supine Bridge - 2 x daily - 7 x weekly - 1 sets - 10 reps - 3s hold ?Supine Quadratus Lumborum Stretch - 2 x daily - 7 x weekly - 1 sets - 3 reps - 30s hold ?Supine 90/90 Abdominal Bracing - 2 x daily - 7 x weekly - 1 sets - 3 reps - 30s hold ?Seated Assisted Cervical Rotation with Towel - 2 x daily - 7 x weekly - 1 sets - 10 reps - 3s hold ? ?[facility] Adult PT Treatment:                                                DATE: 03/27/21 ?Therapeutic Exercise: ?Pt seen for aquatic therapy today.  Treatment took place in water 3.25-4.8 ft in depth at the [employer] pool. Temp of water was 91?.  Pt entered/exited the pool via stairs step through pattern independently with bilat rail. ?Reviewed current function, pain levels, response to prior Rx, and HEP complianc

## 2021-03-29 NOTE — Telephone Encounter (Signed)
PA has been initiated on covermymeds ?Key: [id number] ?PA Case ID: [id number] ?Rx #: [id number] ?

## 2021-03-29 NOTE — Telephone Encounter (Signed)
Pt checking status of fmla form ? ?Pt requesting a cb w/ a status update ?

## 2021-03-29 NOTE — Telephone Encounter (Signed)
Paperwork has been refaxed and emailed to the pt. Mychart message sent to the pt  ?

## 2021-04-01 ENCOUNTER — Other Ambulatory Visit: Payer: Self-pay | Admitting: Internal Medicine

## 2021-05-10 ENCOUNTER — Ambulatory Visit: Payer: 59 | Attending: Internal Medicine

## 2021-05-10 NOTE — Therapy (Deleted)
OUTPATIENT PHYSICAL THERAPY CERVICAL EVALUATION   Patient Name: Caroline Carlson MRN: [id number] DOB:[birth year], 41 y.o., female Today's Date: 05/10/2021    Past Medical History:  Diagnosis Date   Anemia    Anxiety    Back pain    Constipation    Depression    Fibromyalgia    History of blood transfusion 06/2002   [facility] - unsure of number of units   Hyperlipidemia    borderline - diet controlled   Mental disorder    Currently depressed   Panic attack    PONV (postoperative nausea and vomiting)    Past Surgical History:  Procedure Laterality Date   BILATERAL SALPINGECTOMY N/A 09/25/2012   Procedure: BILATERAL SALPINGECTOMY;  Surgeon: Lahoma Crocker, MD;  Location: [facility];  Service: Gynecology;  Laterality: N/A;   CESAREAN SECTION  2002, 2004, 2006   x 3   ROBOTIC ASSISTED TOTAL HYSTERECTOMY N/A 09/25/2012   Procedure: ROBOTIC ASSISTED TOTAL HYSTERECTOMY;  Surgeon: Lahoma Crocker, MD;  Location: [facility];  Service: Gynecology;  Laterality: N/A;   TUBAL LIGATION     Patient Active Problem List   Diagnosis Date Noted   Acne 01/31/2021   Acute left-sided low back pain with left-sided sciatica 11/17/2020   Allergic rhinitis 11/17/2020   Protrusion of cervical intervertebral disc 07/07/2019   Prediabetes 05/05/2019   Elevated cholesterol 05/05/2019   Vitamin D deficiency 05/05/2019   Morbid obesity ([place]) 08/18/2013   Depression 08/14/2012    PCP: Hoyt Koch, MD   REFERRING PROVIDER: Earnie Larsson, MD  REFERRING DIAG: [phone] (ICD-10-CM) - Acute left-sided low back pain with left-sided sciatica M50.20 (ICD-10-CM) - Protrusion of cervical intervertebral disc   THERAPY DIAG: cervical spondylosis   ONSET DATE: 01/30/2021   SUBJECTIVE:                                                                                                                                                                                                         SUBJECTIVE  STATEMENT: ***  PERTINENT HISTORY:  Referral to PT for pain management as well as neurosurgery for long term strategy. She is in pain after working and for several days after. We talked about consideration of different job if this is impacting her long term health to avoid long term injury. Taking ibuprofen otc and this is helping with pain.    PAIN:  Are you having pain? {OPRCPAIN:27236}  PRECAUTIONS: None  WEIGHT BEARING RESTRICTIONS No  FALLS:  Has patient fallen in last 6 months? No  LIVING ENVIRONMENT: Lives with: lives with their  family Lives in: House/apartment Stairs: {opstairs:27293} Has following equipment at home: {Assistive devices:23999}  OCCUPATION: ***  PLOF: Independent  PATIENT GOALS ***  OBJECTIVE:   DIAGNOSTIC FINDINGS:  IMPRESSION: Cervical spondylosis as outlined and most notably as follows.   At C4-C5, disc bulge with superimposed small central disc protrusion. Uncinate hypertrophy. The disc protrusion contacts and mildly flattens the ventral spinal cord contributing to overall mild spinal canal stenosis. Mild left neural foraminal narrowing.   At C5-C6, disc bulge with osteophyte ridge and uncinate hypertrophy contribute to mild left neural foraminal narrowing.      PATIENT SURVEYS:  FOTO ***   COGNITION: Overall cognitive status: Within functional limits for tasks assessed   SENSATION: {sensation:27233}  POSTURE:  ***  PALPATION: ***   CERVICAL ROM:   {AROM/PROM:27142} ROM A/PROM (deg) 05/10/2021  Flexion   Extension   Right lateral flexion   Left lateral flexion   Right rotation   Left rotation    (Blank rows = not tested)  UE ROM:  {AROM/PROM:27142} ROM Right 05/10/2021 Left 05/10/2021  Shoulder flexion    Shoulder extension    Shoulder abduction    Shoulder adduction    Shoulder extension    Shoulder internal rotation    Shoulder external rotation    Elbow flexion    Elbow extension    Wrist flexion     Wrist extension    Wrist ulnar deviation    Wrist radial deviation    Wrist pronation    Wrist supination     (Blank rows = not tested)  UE MMT:  MMT Right 05/10/2021 Left 05/10/2021  Shoulder flexion    Shoulder extension    Shoulder abduction    Shoulder adduction    Shoulder extension    Shoulder internal rotation    Shoulder external rotation    Middle trapezius    Lower trapezius    Elbow flexion    Elbow extension    Wrist flexion    Wrist extension    Wrist ulnar deviation    Wrist radial deviation    Wrist pronation    Wrist supination    Grip strength     (Blank rows = not tested)  CERVICAL SPECIAL TESTS:  {Cervical special tests:25246}   FUNCTIONAL TESTS:  {Functional tests:24029}  PATIENT SURVEYS:  FOTO ***  TODAY'S TREATMENT:  ***   PATIENT EDUCATION:  Education details: Discussed eval findings, rehab rationale and POC and patient is in agreement  Person educated: Patient Education method: Customer service manager Education comprehension: verbalized understanding, returned demonstration, and needs further education   HOME EXERCISE PROGRAM: ***  ASSESSMENT:  CLINICAL IMPRESSION: Patient is a *** y.o. *** who was seen today for physical therapy evaluation and treatment for ***.    OBJECTIVE IMPAIRMENTS {opptimpairments:25111}.   ACTIVITY LIMITATIONS {activity limitations:25113}.   PERSONAL FACTORS {Personal factors:25162} are also affecting patient's functional outcome.    REHAB POTENTIAL: {rehabpotential:25112}  CLINICAL DECISION MAKING: {clinical decision making:25114}  EVALUATION COMPLEXITY: {Evaluation complexity:25115}   GOALS: Goals reviewed with patient? {yes/no:20286}  SHORT TERM GOALS: Target date: {follow up:25551}  *** Baseline: *** Goal status: {GOALSTATUS:25110}  2.  *** Baseline: *** Goal status: {GOALSTATUS:25110}  3.  *** Baseline: *** Goal status: {GOALSTATUS:25110}  4.  *** Baseline:  *** Goal status: {GOALSTATUS:25110}  5.  *** Baseline: *** Goal status: {GOALSTATUS:25110}  6.  *** Baseline: *** Goal status: {GOALSTATUS:25110}  LONG TERM GOALS: Target date: {follow up:25551}  *** Baseline: *** Goal status: {GOALSTATUS:25110}  2.  ***  Baseline: *** Goal status: {GOALSTATUS:25110}  3.  *** Baseline: *** Goal status: {GOALSTATUS:25110}  4.  *** Baseline: *** Goal status: {GOALSTATUS:25110}  5.  *** Baseline: *** Goal status: {GOALSTATUS:25110}  6.  *** Baseline: *** Goal status: {GOALSTATUS:25110}   PLAN: PT FREQUENCY: {rehab frequency:25116}  PT DURATION: {rehab duration:25117}  PLANNED INTERVENTIONS: {rehab planned interventions:25118::"Therapeutic exercises","Therapeutic activity","Neuromuscular re-education","Balance training","Gait training","Patient/Family education","Joint mobilization"}  PLAN FOR NEXT SESSION: ***   Lanice Shirts, PT 05/10/2021, 9:23 AM

## 2021-07-13 ENCOUNTER — Encounter: Payer: Self-pay | Admitting: Family Medicine

## 2021-07-13 ENCOUNTER — Telehealth (INDEPENDENT_AMBULATORY_CARE_PROVIDER_SITE_OTHER): Payer: 59 | Admitting: Family Medicine

## 2021-07-13 DIAGNOSIS — S80861A Insect bite (nonvenomous), right lower leg, initial encounter: Secondary | ICD-10-CM | POA: Diagnosis not present

## 2021-07-13 DIAGNOSIS — W57XXXA Bitten or stung by nonvenomous insect and other nonvenomous arthropods, initial encounter: Secondary | ICD-10-CM

## 2021-07-13 HISTORY — DX: Insect bite (nonvenomous), right lower leg, initial encounter: W57.XXXA

## 2021-07-13 NOTE — Assessment & Plan Note (Signed)
Reassured her that her risk of a tick borne illness is very low based on CDC criteria that does not exist in her situation. Recommend using OTC hydrocortisone as needed for itching or localized reaction. List of red flag symptoms and when to seek care provided.

## 2021-07-13 NOTE — Progress Notes (Signed)
MyChart Video Visit    Virtual Visit via Video Note   This visit type was conducted due to national recommendations for restrictions regarding the COVID-19 Pandemic (e.g. social distancing) in an effort to limit this patient's exposure and mitigate transmission in our community. This patient is at least at moderate risk for complications without adequate follow up. This format is felt to be most appropriate for this patient at this time. Physical exam was limited by quality of the video and audio technology used for the visit. CMA was able to get the patient set up on a video visit.  Patient location: Home. Patient and provider in visit Provider location: Office  I discussed the limitations of evaluation and management by telemedicine and the availability of in person appointments. The patient expressed understanding and agreed to proceed.  Visit Date: 07/13/2021  Today's healthcare provider: Harland Dingwall, NP-C     Subjective:    Patient ID: Caroline Carlson, female    DOB: 16-Apr-1980, 41 y.o.   MRN: [id number]  Chief Complaint  Patient presents with   Insect Bite    Tick bite located on leg first noticed this morning    HPI  States she pulled a tick off of her leg this morning and it was intact.  States she tick was "big" but not engorged. States she tick was not small like a deer tick. She denies fever, chills, headache, neck pain, N/V rash.    Past Medical History:  Diagnosis Date   Anemia    Anxiety    Back pain    Constipation    Depression    Fibromyalgia    History of blood transfusion 06/2002   [facility] - unsure of number of units   Hyperlipidemia    borderline - diet controlled   Mental disorder    Currently depressed   Panic attack    PONV (postoperative nausea and vomiting)    Tick bite of right lower leg 07/13/2021    Past Surgical History:  Procedure Laterality Date   BILATERAL SALPINGECTOMY N/A 09/25/2012   Procedure: BILATERAL SALPINGECTOMY;   Surgeon: Lahoma Crocker, MD;  Location: [facility];  Service: Gynecology;  Laterality: N/A;   CESAREAN SECTION  2002, 2004, 2006   x 3   ROBOTIC ASSISTED TOTAL HYSTERECTOMY N/A 09/25/2012   Procedure: ROBOTIC ASSISTED TOTAL HYSTERECTOMY;  Surgeon: Lahoma Crocker, MD;  Location: [facility];  Service: Gynecology;  Laterality: N/A;   TUBAL LIGATION      Family History  Problem Relation Age of Onset   Diabetes Mother    High blood pressure Mother    Stroke Mother    Heart disease Mother    Anxiety disorder Mother    Heart disease Other    Hypertension Other     Social History   Socioeconomic History   Marital status: Married    Spouse name: Not on file   Number of children: Not on file   Years of education: Not on file   Highest education level: Not on file  Occupational History   Occupation: out of work for the last year  Tobacco Use   Smoking status: Former    Packs/day: 0.20    Years: 0.50    Total pack years: 0.10    Types: Cigarettes   Smokeless tobacco: Never   Tobacco comments:    trying to quit  Substance and Sexual Activity   Alcohol use: No   Drug use: No   Sexual activity: Yes  Birth control/protection: Surgical  Other Topics Concern   Not on file  Social History Narrative   Not on file   Social Determinants of Health   Financial Resource Strain: Not on file  Food Insecurity: Not on file  Transportation Needs: Not on file  Physical Activity: Not on file  Stress: Not on file  Social Connections: Not on file  Intimate Partner Violence: Not on file    Outpatient Medications Prior to Visit  Medication Sig Dispense Refill   Azelaic Acid (FINACEA) 15 % FOAM Apply 0.15 g topically in the morning and at bedtime. 50 g 6   cyclobenzaprine (FLEXERIL) 5 MG tablet Take 1 tablet (5 mg total) by mouth 3 (three) times daily as needed for muscle spasms. 30 tablet 1   ibuprofen (ADVIL) 600 MG tablet Take 1 tablet (600 mg total) by mouth every 6 (six) hours as  needed. 30 tablet 0   montelukast (SINGULAIR) 10 MG tablet Take 1 tablet (10 mg total) by mouth at bedtime. 30 tablet 3   topiramate (TOPAMAX) 50 MG tablet TAKE 1 TABLET BY MOUTH EVERY DAY 30 tablet 1   tretinoin (RETIN-A) 0.1 % cream Apply topically at bedtime. 45 g 0   Vitamin D, Ergocalciferol, (DRISDOL) 1.25 MG (50000 UNIT) CAPS capsule Take 1 capsule (50,000 Units total) by mouth every 7 (seven) days. 12 capsule 0   No facility-administered medications prior to visit.    No Known Allergies  ROS     Objective:    Physical Exam  LMP 09/11/2012  Wt Readings from Last 3 Encounters:  03/23/21 222 lb 4 oz (100.8 kg)  03/09/21 216 lb 6.4 oz (98.2 kg)  01/30/21 214 lb (97.1 kg)       Assessment & Plan:   Problem List Items Addressed This Visit       Musculoskeletal and Integument   Tick bite of right lower leg - Primary    Reassured her that her risk of a tick borne illness is very low based on CDC criteria that does not exist in her situation. Recommend using OTC hydrocortisone as needed for itching or localized reaction. List of red flag symptoms and when to seek care provided.        I am having Caroline Carlson maintain her ibuprofen, montelukast, Vitamin D (Ergocalciferol), Finacea, cyclobenzaprine, tretinoin, and topiramate.  No orders of the defined types were placed in this encounter.   I discussed the assessment and treatment plan with the patient. The patient was provided an opportunity to ask questions and all were answered. The patient agreed with the plan and demonstrated an understanding of the instructions.   The patient was advised to call back or seek an in-person evaluation if the symptoms worsen or if the condition fails to improve as anticipated.  I provided 10 minutes of face-to-face time during this encounter.   Harland Dingwall, NP-C [facility] [place] [phone] (phone) [phone] (fax)  [place]

## 2021-07-16 NOTE — Telephone Encounter (Signed)
FYI

## 2021-08-01 ENCOUNTER — Encounter: Payer: Self-pay | Admitting: Internal Medicine

## 2021-08-01 ENCOUNTER — Ambulatory Visit (INDEPENDENT_AMBULATORY_CARE_PROVIDER_SITE_OTHER): Payer: 59 | Admitting: Internal Medicine

## 2021-08-01 VITALS — BP 126/76 | HR 53 | Resp 18 | Ht 65.0 in | Wt 228.2 lb

## 2021-08-01 DIAGNOSIS — R1013 Epigastric pain: Secondary | ICD-10-CM | POA: Diagnosis not present

## 2021-08-01 LAB — CBC
HCT: 40 % (ref 36.0–46.0)
Hemoglobin: 13.3 g/dL (ref 12.0–15.0)
MCHC: 33.2 g/dL (ref 30.0–36.0)
MCV: 89.6 fl (ref 78.0–100.0)
Platelets: 325 10*3/uL (ref 150.0–400.0)
RBC: 4.46 Mil/uL (ref 3.87–5.11)
RDW: 15 % (ref 11.5–15.5)
WBC: 7.8 10*3/uL (ref 4.0–10.5)

## 2021-08-01 LAB — COMPREHENSIVE METABOLIC PANEL
ALT: 26 U/L (ref 0–35)
AST: 31 U/L (ref 0–37)
Albumin: 4.4 g/dL (ref 3.5–5.2)
Alkaline Phosphatase: 70 U/L (ref 39–117)
BUN: 16 mg/dL (ref 6–23)
CO2: 27 mEq/L (ref 19–32)
Calcium: 9.3 mg/dL (ref 8.4–10.5)
Chloride: 104 mEq/L (ref 96–112)
Creatinine, Ser: 0.82 mg/dL (ref 0.40–1.20)
GFR: 88.84 mL/min (ref >60.00)
Glucose, Bld: 99 mg/dL (ref 70–99)
Potassium: 4.2 mEq/L (ref 3.5–5.1)
Sodium: 139 mEq/L (ref 135–145)
Total Bilirubin: 0.4 mg/dL (ref 0.2–1.2)
Total Protein: 7.3 g/dL (ref 6.0–8.3)

## 2021-08-01 LAB — LIPASE: Lipase: 39 U/L (ref 11.0–59.0)

## 2021-08-01 MED ORDER — PANTOPRAZOLE SODIUM 40 MG PO TBEC: 40.0000 mg | DELAYED_RELEASE_TABLET | Freq: Every day | ORAL | 3 refills | Status: DC

## 2021-08-01 NOTE — Progress Notes (Signed)
   Subjective:   Patient ID: Caroline Carlson, female    DOB: 04/24/1980, 41 y.o.   MRN: [id number]  HPI The patient is a 41 YO female coming in for epigastric pain and headaches.  Review of Systems  Constitutional: Negative.   HENT: Negative.    Eyes: Negative.   Respiratory:  Negative for cough, chest tightness and shortness of breath.   Cardiovascular:  Negative for chest pain, palpitations and leg swelling.  Gastrointestinal:  Negative for abdominal distention, abdominal pain, constipation, diarrhea, nausea and vomiting.  Musculoskeletal: Negative.   Skin: Negative.   Neurological: Negative.   Psychiatric/Behavioral: Negative.      Objective:  Physical Exam Constitutional:      Appearance: She is well-developed. She is obese.  HENT:     Head: Normocephalic and atraumatic.  Cardiovascular:     Rate and Rhythm: Normal rate and regular rhythm.  Pulmonary:     Effort: Pulmonary effort is normal. No respiratory distress.     Breath sounds: Normal breath sounds. No wheezing or rales.  Abdominal:     General: Bowel sounds are normal. There is no distension.     Palpations: Abdomen is soft.     Tenderness: There is abdominal tenderness. There is no rebound.     Comments: Mild tenderness epigastric  Musculoskeletal:     Cervical back: Normal range of motion.  Skin:    General: Skin is warm and dry.  Neurological:     Mental Status: She is alert and oriented to person, place, and time.     Coordination: Coordination normal.     Vitals:   08/01/21 1011  BP: 126/76  Pulse: (!) 53  Resp: 18  SpO2: 92%  Weight: 228 lb 3.2 oz (103.5 kg)  Height: '5\' 5"'$  (1.651 m)    Assessment & Plan:

## 2021-08-01 NOTE — Patient Instructions (Signed)
We will check the labs today.  We have sent in protonix to take daily before breakfast (okay to take when you get it today).  Let us know in 2-3 days how you are doing. If not better we can check an ultrasound of the stomach.

## 2021-08-03 DIAGNOSIS — R1013 Epigastric pain: Secondary | ICD-10-CM | POA: Insufficient documentation

## 2021-08-03 NOTE — Assessment & Plan Note (Signed)
Checking CBC, CMP, lipase to rule out causes. Rx protonix 40 mg daily and if no improvement in 2-3 days will check US abdomen.

## 2021-08-22 ENCOUNTER — Encounter (INDEPENDENT_AMBULATORY_CARE_PROVIDER_SITE_OTHER): Payer: Self-pay

## 2021-11-01 ENCOUNTER — Encounter: Payer: Self-pay | Admitting: Internal Medicine

## 2021-11-01 ENCOUNTER — Ambulatory Visit (INDEPENDENT_AMBULATORY_CARE_PROVIDER_SITE_OTHER): Payer: 59 | Admitting: Internal Medicine

## 2021-11-01 VITALS — BP 120/80 | HR 78 | Temp 98.1°F | Ht 65.0 in | Wt 235.0 lb

## 2021-11-01 DIAGNOSIS — Z0000 Encounter for general adult medical examination without abnormal findings: Secondary | ICD-10-CM | POA: Diagnosis not present

## 2021-11-01 DIAGNOSIS — R7303 Prediabetes: Secondary | ICD-10-CM | POA: Diagnosis not present

## 2021-11-01 DIAGNOSIS — Z23 Encounter for immunization: Secondary | ICD-10-CM

## 2021-11-01 DIAGNOSIS — E559 Vitamin D deficiency, unspecified: Secondary | ICD-10-CM

## 2021-11-01 DIAGNOSIS — E7800 Pure hypercholesterolemia, unspecified: Secondary | ICD-10-CM

## 2021-11-01 LAB — CBC
HCT: 38 % (ref 36.0–46.0)
Hemoglobin: 12.4 g/dL (ref 12.0–15.0)
MCHC: 32.8 g/dL (ref 30.0–36.0)
MCV: 88.8 fl (ref 78.0–100.0)
Platelets: 321 10*3/uL (ref 150.0–400.0)
RBC: 4.27 Mil/uL (ref 3.87–5.11)
RDW: 14.3 % (ref 11.5–15.5)
WBC: 10.6 10*3/uL — ABNORMAL HIGH (ref 4.0–10.5)

## 2021-11-01 LAB — COMPREHENSIVE METABOLIC PANEL
ALT: 17 U/L (ref 0–35)
AST: 16 U/L (ref 0–37)
Albumin: 4 g/dL (ref 3.5–5.2)
Alkaline Phosphatase: 69 U/L (ref 39–117)
BUN: 15 mg/dL (ref 6–23)
CO2: 24 mEq/L (ref 19–32)
Calcium: 9.3 mg/dL (ref 8.4–10.5)
Chloride: 106 mEq/L (ref 96–112)
Creatinine, Ser: 0.85 mg/dL (ref 0.40–1.20)
GFR: 84.94 mL/min (ref >60.00)
Glucose, Bld: 114 mg/dL — ABNORMAL HIGH (ref 70–99)
Potassium: 3.8 mEq/L (ref 3.5–5.1)
Sodium: 139 mEq/L (ref 135–145)
Total Bilirubin: 0.2 mg/dL (ref 0.2–1.2)
Total Protein: 6.9 g/dL (ref 6.0–8.3)

## 2021-11-01 LAB — LIPID PANEL
Cholesterol: 210 mg/dL — ABNORMAL HIGH (ref 0–200)
HDL: 47.1 mg/dL (ref >39.00)
LDL Cholesterol: 133 mg/dL — ABNORMAL HIGH (ref 0–99)
NonHDL: 162.87
Total CHOL/HDL Ratio: 4
Triglycerides: 148 mg/dL (ref 0.0–149.0)
VLDL: 29.6 mg/dL (ref 0.0–40.0)

## 2021-11-01 LAB — HEMOGLOBIN A1C: Hgb A1c MFr Bld: 6.4 % (ref 4.6–6.5)

## 2021-11-01 LAB — VITAMIN D 25 HYDROXY (VIT D DEFICIENCY, FRACTURES): VITD: 20.33 ng/mL — ABNORMAL LOW (ref 30.00–100.00)

## 2021-11-01 MED ORDER — SEMAGLUTIDE-WEIGHT MANAGEMENT 1 MG/0.5ML ~~LOC~~ SOAJ: 1.0000 mg | SUBCUTANEOUS | 0 refills | Status: DC

## 2021-11-01 MED ORDER — SEMAGLUTIDE-WEIGHT MANAGEMENT 2.4 MG/0.75ML ~~LOC~~ SOAJ: 2.4000 mg | SUBCUTANEOUS | 0 refills | Status: DC

## 2021-11-01 MED ORDER — SEMAGLUTIDE-WEIGHT MANAGEMENT 0.5 MG/0.5ML ~~LOC~~ SOAJ: 0.5000 mg | SUBCUTANEOUS | 0 refills | Status: AC

## 2021-11-01 MED ORDER — SEMAGLUTIDE-WEIGHT MANAGEMENT 1.7 MG/0.75ML ~~LOC~~ SOAJ: 1.7000 mg | SUBCUTANEOUS | 0 refills | Status: DC

## 2021-11-01 MED ORDER — SEMAGLUTIDE-WEIGHT MANAGEMENT 0.25 MG/0.5ML ~~LOC~~ SOAJ: 0.2500 mg | SUBCUTANEOUS | 0 refills | Status: AC

## 2021-11-01 NOTE — Assessment & Plan Note (Signed)
Checking HgA1c and adjust as needed.  

## 2021-11-01 NOTE — Assessment & Plan Note (Signed)
BMI 39 complicated by back pain and pre-diabetes. Rx wegovy we will try to obtain coverage.

## 2021-11-01 NOTE — Assessment & Plan Note (Signed)
Checking vitamin D and adjust as needed. 

## 2021-11-01 NOTE — Progress Notes (Addendum)
   Subjective:   Patient ID: Caroline Carlson, female    DOB: 1980/06/17, 42 y.o.   MRN: [id number]  HPI The patient is here for physical.  PMH, [facility], social history reviewed and updated  Review of Systems  Constitutional: Negative.   HENT: Negative.    Eyes: Negative.   Respiratory:  Negative for cough, chest tightness and shortness of breath.   Cardiovascular:  Negative for chest pain, palpitations and leg swelling.  Gastrointestinal:  Negative for abdominal distention, abdominal pain, constipation, diarrhea, nausea and vomiting.  Musculoskeletal: Negative.   Skin: Negative.   Neurological: Negative.   Psychiatric/Behavioral: Negative.      Objective:  Physical Exam Constitutional:      Appearance: She is well-developed. She is obese.  HENT:     Head: Normocephalic and atraumatic.  Cardiovascular:     Rate and Rhythm: Normal rate and regular rhythm.  Pulmonary:     Effort: Pulmonary effort is normal. No respiratory distress.     Breath sounds: Normal breath sounds. No wheezing or rales.  Abdominal:     General: Bowel sounds are normal. There is no distension.     Palpations: Abdomen is soft.     Tenderness: There is no abdominal tenderness. There is no rebound.  Musculoskeletal:     Cervical back: Normal range of motion.  Skin:    General: Skin is warm and dry.  Neurological:     Mental Status: She is alert and oriented to person, place, and time.     Coordination: Coordination normal.     Vitals:   11/01/21 1012  BP: 120/80  Pulse: 78  Temp: 98.1 F (36.7 C)  TempSrc: Oral  SpO2: 96%  Weight: 235 lb (106.6 kg)  Height: '5\' 5"'$  (1.651 m)    Assessment & Plan:

## 2021-11-01 NOTE — Assessment & Plan Note (Signed)
Checking lipid panel and adjust as needed. Not on meds currently.  

## 2021-11-01 NOTE — Patient Instructions (Signed)
We have sent in the wegovy to see if we can get this covered.

## 2021-11-01 NOTE — Assessment & Plan Note (Addendum)
Flu shot declines. Covid-19 counseled. Tetanus due. Mammogram with gyn, pap smear with gyn. Counseled about sun safety and mole surveillance. Counseled about the dangers of distracted driving. Given 10 year screening recommendations.

## 2021-11-02 ENCOUNTER — Telehealth: Payer: Self-pay

## 2021-11-02 NOTE — Telephone Encounter (Signed)
Started PA for [employer], waiting for approval.

## 2021-11-05 NOTE — Telephone Encounter (Signed)
Patient is calling in stating got a message saying it was denied. Wants to know if we ran it through both insurances, [facility] and Medicaid.

## 2021-11-05 NOTE — Telephone Encounter (Signed)
I have started the appeal process, currently waiting on the answer for it.

## 2021-11-05 NOTE — Telephone Encounter (Signed)
Patient returned call and was informed that appeal process has been started.

## 2021-11-09 ENCOUNTER — Other Ambulatory Visit: Payer: Self-pay | Admitting: Internal Medicine

## 2021-11-09 DIAGNOSIS — R7303 Prediabetes: Secondary | ICD-10-CM

## 2021-11-14 NOTE — Telephone Encounter (Signed)
Appeal was denied by [facility] [employer] as of 11/12/2021

## 2021-11-15 NOTE — Telephone Encounter (Signed)
Pt called asking if she can see the paperwork of the denial so she can understand why they denied her appeal.  Call pt [phone]

## 2021-11-15 NOTE — Telephone Encounter (Signed)
Spoke with patient and explained her denial and also informed her that I would redo the PA for her.

## 2021-11-20 ENCOUNTER — Telehealth: Payer: Self-pay

## 2021-11-20 NOTE — Telephone Encounter (Signed)
Pt called and stated that [employer] needed more information for her wegovy and I sent progress notes to [employer] for further review.

## 2021-11-20 NOTE — Telephone Encounter (Signed)
Spoke with patient today about her wegovy appeal

## 2021-11-26 NOTE — Telephone Encounter (Signed)
Pt asked if there is anything she can do to help with the appeal for wegovy. Pt verbalized understanding the appeal is in process. Pt says she may call her insurance to see if there is anything she can do to further assist. Pt phone number [phone]

## 2021-11-27 NOTE — Telephone Encounter (Signed)
Pt checking to make sure documentation of her previous attempts at weight loss with pills and programs.  Pt says [employer] told her they have not received anything from our office.

## 2021-11-27 NOTE — Telephone Encounter (Signed)
I will call [employer]. I have no response on wegovy appeal

## 2021-11-28 NOTE — Telephone Encounter (Signed)
I was able to redo the PA for the [facility] and add the information needed.  New Key: [id number]

## 2021-12-03 ENCOUNTER — Telehealth: Payer: Self-pay | Admitting: Internal Medicine

## 2021-12-03 NOTE — Telephone Encounter (Signed)
Dr. Barb Merino office would like to schedule a peer to peer review with Dr. Sharlet Salina.  I have enclosed the details of where to call.

## 2021-12-03 NOTE — Telephone Encounter (Signed)
Joy is calling in response to the [place]. Calling to schedule a PEER TO PEER  review for [facility]. Joy would like to schedule:  TUESDAY 12/11/21 AT 12:00 PM (noon) TELEPHONE PEER TO PEER REVIEW.  Call [phone] Ext [phone] Joy to confirm.

## 2021-12-03 NOTE — Telephone Encounter (Signed)
Peer to peer for what?

## 2021-12-04 NOTE — Telephone Encounter (Signed)
I guess fine although we typically do not do peer to peer for meds

## 2021-12-10 NOTE — Telephone Encounter (Signed)
Pt called to ask if the peer to peer meeting is going to happen.  Pt says she was told it was ok'd by provider but [employer] her today and said it was denied. Please update pt at [phone]

## 2021-12-10 NOTE — Telephone Encounter (Signed)
Contacting [employer] currently to see why she is getting denied

## 2021-12-12 ENCOUNTER — Encounter: Payer: 59 | Attending: Internal Medicine | Admitting: Dietician

## 2021-12-12 ENCOUNTER — Encounter: Payer: Self-pay | Admitting: Dietician

## 2021-12-12 VITALS — Ht 65.0 in | Wt 237.0 lb

## 2021-12-12 DIAGNOSIS — R7303 Prediabetes: Secondary | ICD-10-CM | POA: Insufficient documentation

## 2021-12-12 NOTE — Progress Notes (Signed)
Medical Nutrition Therapy  Appointment Start time:  [phone]  Appointment End time:  62  Primary concerns today: Pt states she wants to talk about prediabetes and she feels like she is eating right but is still gaining weight.   Referral diagnosis: prediabetes Preferred learning style: no preference indicated Learning readiness: ready   NUTRITION ASSESSMENT   Anthropometrics  Ht: 65in Wt: 237 lbs  Clinical Medical Hx: anemia, anxiety, blood transfusion, depression, HLD, nerve/muscle disease Medications: semaglutide Labs: 11/01/21: chol 210, ldl 133, A1c 6.4% Notable Signs/Symptoms: none Food Allergies: shellfish  Lifestyle & Dietary Hx  Pt has cousins with type 2 diabetes and wants to prevent it for herself.   Pt states she has been to the gym in the past and dropped her weight in the past and now states she continues to gain weight.   Pt hasn't been to the gym in a month, but states she was going consistently before.   Pt is down to 1 car right now and is driving around all day. Pt lives with family of 5. 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 Pt is in the car from 7am to 10pm most days.   Pt recently quit working in order to help drive her family around.  Pt states she eats bad but she does not eat a lot. Pt does not eat until 3pm most days. Pt states she eats a lot of sweets and junk food at night.   Pt states she has issues with constipation sometimes.   Estimated daily fluid intake: 64 oz Supplements: vitamin D Sleep: 5 hours, pt states she has been sleeping better.  Stress / self-care: high stress Current average weekly physical activity: ADLs  24-Hr Dietary Recall First Meal: none Snack: none Second Meal: none Snack: chips or crackers Third Meal: 2-5pm: wendys OR cooks spaghetti OR chopt avocado chicken caesar OR kava Snack: ice cream and cake  Beverages: crumble cookie starbucks latte, water, ginger ale occasionally    NUTRITION DIAGNOSIS   NB-1.1 Food and nutrition-related knowledge deficit As related to prediabetes.  As evidenced by pt report and diet history.   NUTRITION INTERVENTION  Nutrition education (E-1) on the following topics:  Prediabetes and A1c Impact of physical activity on blood glucose LDL lowering nutrition therapy Saturated vs unsaturated fat Meal options for dining out Snack suggestions MyPlate Consistent meal times  Handouts Provided Include  Dish Up a Healthy Meal A1c chart Snack Suggestions Nutrition Care Manual: LDL-Lowering Nutrition Therapy  Learning Style & Readiness for Change Teaching method utilized: Visual & Auditory  Demonstrated degree of understanding via: Teach Back  Barriers to learning/adherence to lifestyle change: none  Goals Established by Pt Aim for 150 minutes of physical activity weekly.  Eat more Non-Starchy Vegetables and Fruits. Goal: aim to make 1/2 of your plate vegetables and/or fruit at least 2x/day  Minimize added sugars and refined grains. Rethink what you drink. Choose beverages without added sugar. Look for 0 carbs on the label.  Choose whole foods over processed.  Make simple meals at home more often than eating out.  Aim to eat within 1-2 hours of waking up and every 3-5 hours following.  -pack balanced snacks for the car, pack lunch.    MONITORING & EVALUATION Dietary intake, weekly physical activity, and follow up in 2 months.  Next Steps  Patient is to call for questions.

## 2021-12-12 NOTE — Patient Instructions (Addendum)
Aim for 150 minutes of physical activity weekly.  Eat more Non-Starchy Vegetables and Fruits. Goal: aim to make 1/2 of your plate vegetables and/or fruit at least 2x/day  Minimize added sugars and refined grains. Rethink what you drink. Choose beverages without added sugar. Look for 0 carbs on the label.  Choose whole foods over processed.  Make simple meals at home more often than eating out.  Aim to eat within 1-2 hours of waking up and every 3-5 hours following.  -pack balanced snacks for the car, pack lunch.

## 2021-12-19 ENCOUNTER — Telehealth: Payer: Self-pay | Admitting: Internal Medicine

## 2021-12-19 NOTE — Telephone Encounter (Signed)
Pt called to ask provider to send prescriptions for ozempic to [place] at [facility].  OZEMPIC 0.25 to start with, then progress to next staged dosage. (Send all if provider wants).  [place] Phone [phone] Fax [phone]  Pt says she has decided to pay out of pocket.

## 2021-12-20 NOTE — Telephone Encounter (Signed)
As previously stated to patient she does not have diabetes so cannot take ozempic. Does she want to try wegovy?

## 2022-01-18 ENCOUNTER — Ambulatory Visit (INDEPENDENT_AMBULATORY_CARE_PROVIDER_SITE_OTHER): Payer: 59 | Admitting: Internal Medicine

## 2022-01-18 ENCOUNTER — Other Ambulatory Visit: Payer: Self-pay | Admitting: Internal Medicine

## 2022-01-18 ENCOUNTER — Ambulatory Visit (INDEPENDENT_AMBULATORY_CARE_PROVIDER_SITE_OTHER): Payer: 59

## 2022-01-18 ENCOUNTER — Encounter: Payer: Self-pay | Admitting: Internal Medicine

## 2022-01-18 VITALS — BP 112/80 | HR 80 | Temp 98.2°F | Ht 65.0 in | Wt 240.0 lb

## 2022-01-18 DIAGNOSIS — M25551 Pain in right hip: Secondary | ICD-10-CM

## 2022-01-18 DIAGNOSIS — M25552 Pain in left hip: Secondary | ICD-10-CM

## 2022-01-18 DIAGNOSIS — M5442 Lumbago with sciatica, left side: Secondary | ICD-10-CM | POA: Diagnosis not present

## 2022-01-18 MED ORDER — SEMAGLUTIDE-WEIGHT MANAGEMENT 2.4 MG/0.75ML ~~LOC~~ SOAJ
2.4000 mg | SUBCUTANEOUS | 0 refills | Status: DC
  Filled 2022-04-17 – 2022-07-13 (×2): qty 3, 28d supply, fill #0

## 2022-01-18 MED ORDER — SEMAGLUTIDE-WEIGHT MANAGEMENT 0.5 MG/0.5ML ~~LOC~~ SOAJ: 0.5000 mg | SUBCUTANEOUS | 0 refills | Status: AC

## 2022-01-18 MED ORDER — SEMAGLUTIDE-WEIGHT MANAGEMENT 0.25 MG/0.5ML ~~LOC~~ SOAJ
0.2500 mg | SUBCUTANEOUS | 0 refills | Status: DC
Start: 2022-01-18 — End: 2022-04-16
  Filled 2022-01-28 – 2022-03-25 (×2): qty 2, 28d supply, fill #0

## 2022-01-18 MED ORDER — SEMAGLUTIDE-WEIGHT MANAGEMENT 1.7 MG/0.75ML ~~LOC~~ SOAJ
1.7000 mg | SUBCUTANEOUS | 0 refills | Status: AC
  Filled 2022-06-11: qty 3, 28d supply, fill #0

## 2022-01-18 MED ORDER — SEMAGLUTIDE-WEIGHT MANAGEMENT 1 MG/0.5ML ~~LOC~~ SOAJ: 1.0000 mg | SUBCUTANEOUS | 0 refills | Status: AC

## 2022-01-18 MED ORDER — CYCLOBENZAPRINE HCL 5 MG PO TABS: 5.0000 mg | ORAL_TABLET | Freq: Three times a day (TID) | ORAL | 2 refills | Status: DC | PRN

## 2022-01-18 NOTE — Assessment & Plan Note (Signed)
Ongoing and more consistent over the last year. She is working on [employer]. Rx flexeril 5 mg TID prn which she uses rarely for pain.

## 2022-01-18 NOTE — Progress Notes (Signed)
   Subjective:   Patient ID: Caroline Carlson, female    DOB: 23-Jul-1980, 42 y.o.   MRN: [id number]  HPI The patient is a 42 YO female coming in for follow up and ongoing weight problems.   Review of Systems  Constitutional:  Positive for activity change. Negative for appetite change, chills, fatigue, fever and unexpected weight change.  Respiratory: Negative.    Cardiovascular: Negative.   Gastrointestinal: Negative.   Musculoskeletal:  Positive for arthralgias, back pain and myalgias. Negative for gait problem and joint swelling.  Skin: Negative.   Neurological: Negative.     Objective:  Physical Exam Constitutional:      Appearance: She is well-developed. She is obese.  HENT:     Head: Normocephalic and atraumatic.  Cardiovascular:     Rate and Rhythm: Normal rate and regular rhythm.  Pulmonary:     Effort: Pulmonary effort is normal. No respiratory distress.     Breath sounds: Normal breath sounds. No wheezing or rales.  Abdominal:     General: Bowel sounds are normal. There is no distension.     Palpations: Abdomen is soft.     Tenderness: There is no abdominal tenderness. There is no rebound.  Musculoskeletal:        General: Tenderness present.     Cervical back: Normal range of motion.  Skin:    General: Skin is warm and dry.  Neurological:     Mental Status: She is alert and oriented to person, place, and time.     Coordination: Coordination normal.     Vitals:   01/18/22 1104  BP: 112/80  Pulse: 80  Temp: 98.2 F (36.8 C)  TempSrc: Oral  SpO2: 96%  Weight: 240 lb (108.9 kg)  Height: '5\' 5"'$  (1.651 m)    Assessment & Plan:

## 2022-01-18 NOTE — Assessment & Plan Note (Addendum)
BMI 39 and complicated by elevated cholesterol and pre-diabetes. She has tried topirimate and metformin without success and has also tried several different structured exercise programs without results. I believe she would benefit from [facility] and this is prescribed today.

## 2022-01-18 NOTE — Assessment & Plan Note (Signed)
Ordered x-ray hips and pelvis. Prior remote pelvis fracture.

## 2022-01-24 ENCOUNTER — Ambulatory Visit: Payer: 59 | Admitting: Internal Medicine

## 2022-01-28 ENCOUNTER — Other Ambulatory Visit (HOSPITAL_BASED_OUTPATIENT_CLINIC_OR_DEPARTMENT_OTHER): Payer: Self-pay

## 2022-01-28 MED ORDER — SEMAGLUTIDE-WEIGHT MANAGEMENT 0.25 MG/0.5ML ~~LOC~~ SOAJ
0.2500 mg | SUBCUTANEOUS | 0 refills | Status: DC
  Filled 2022-01-28 – 2022-03-11 (×2): qty 2, 28d supply, fill #0

## 2022-01-30 ENCOUNTER — Other Ambulatory Visit (HOSPITAL_BASED_OUTPATIENT_CLINIC_OR_DEPARTMENT_OTHER): Payer: Self-pay

## 2022-01-31 ENCOUNTER — Encounter: Payer: 59 | Attending: Internal Medicine | Admitting: Dietician

## 2022-01-31 ENCOUNTER — Encounter: Payer: Self-pay | Admitting: Dietician

## 2022-01-31 VITALS — Ht 65.0 in | Wt 241.0 lb

## 2022-01-31 DIAGNOSIS — R7303 Prediabetes: Secondary | ICD-10-CM | POA: Diagnosis present

## 2022-01-31 NOTE — Progress Notes (Signed)
Medical Nutrition Therapy  Appointment Start time:  [phone]  Appointment End time:  1442  Primary concerns today: Pt states she wants to talk about prediabetes and she feels like she is eating right but is still gaining weight.   Referral diagnosis: prediabetes Preferred learning style: no preference indicated Learning readiness: ready   NUTRITION ASSESSMENT   Anthropometrics  Ht: 65in Wt 12/12/21: 237 lbs Wt 01/31/22: 241 lbs  Clinical Medical Hx: anemia, anxiety, blood transfusion, depression, HLD, nerve/muscle disease Medications: semaglutide Labs: 11/01/21: chol 210, ldl 133, A1c 6.4% Notable Signs/Symptoms: none Food Allergies: shellfish  Lifestyle & Dietary Hx  Pt states she 'overdid' it with sweets at the holidays.  Pt states she still doesn't eat breakfast but she has been eating consistent meals otherwise.   Pt states she stopped drinking ginger ale while out.   Pt reports her daughters car got fixed so she has more time at home to cook and to go to the gym and has not been eating out as much or getting starbucks drinks.   Pt states she has been craving sweets after dinner still but has not been eating them and eats fruit instead.   Estimated daily fluid intake: 64 oz Supplements: vitamin D Sleep: 5 hours, pt states she has been sleeping better.  Stress / self-care: high stress Current average weekly physical activity: gym 5x/wk for 60 minutes - weight training and treadmill  24-Hr Dietary Recall First Meal: none OR avocado toast Snack: protein shake after gym OR mixed nuts Second Meal: grain bowl with chicken and vegetables  Snack: banana Third Meal: 2-5pm: at home cava grain bowl  Snack: none OR fruit OR granola and yogurt Beverages: 2L water   NUTRITION DIAGNOSIS  NB-1.1 Food and nutrition-related knowledge deficit As related to prediabetes.  As evidenced by pt report and diet history.   NUTRITION INTERVENTION  Nutrition education (E-1) on the  following topics:  Impact of physical activity on blood glucose Snack suggestions MyPlate Building balanced meals and snacks Importance of consistent meal times Sustainable weight loss goals  Handouts Provided Include  Meal Ideas  Learning Style & Readiness for Change Teaching method utilized: Visual & Auditory  Demonstrated degree of understanding via: Teach Back  Barriers to learning/adherence to lifestyle change: none  Goals Established by Pt Aim for 150 minutes of physical activity weekly. -in progress  Eat more Non-Starchy Vegetables and Fruits. Goal: aim to make 1/2 of your plate vegetables and/or fruit at least 2x/day  Minimize added sugars and refined grains. - in progress  Make simple meals at home more often than eating out. - in progress  Aim to eat within 1-2 hours of waking up and every 3-5 hours following. - in progress -pack balanced snacks for the car, pack lunch.    MONITORING & EVALUATION Dietary intake, weekly physical activity, and follow up in 2 months.  Next Steps  Patient is to call for questions.

## 2022-02-06 ENCOUNTER — Other Ambulatory Visit: Payer: Self-pay | Admitting: Internal Medicine

## 2022-02-07 ENCOUNTER — Telehealth: Payer: Self-pay | Admitting: Internal Medicine

## 2022-02-07 NOTE — Telephone Encounter (Signed)
Patient called stating that she needs a PA done for her medication Semaglutide-Weight Management 0.25 MG/0.5ML SOAJ . Best callback number for patient is [phone].

## 2022-02-08 ENCOUNTER — Other Ambulatory Visit (HOSPITAL_BASED_OUTPATIENT_CLINIC_OR_DEPARTMENT_OTHER): Payer: Self-pay

## 2022-02-08 ENCOUNTER — Telehealth: Payer: Self-pay

## 2022-02-08 ENCOUNTER — Encounter (HOSPITAL_BASED_OUTPATIENT_CLINIC_OR_DEPARTMENT_OTHER): Payer: Self-pay | Admitting: Pharmacist

## 2022-02-08 ENCOUNTER — Other Ambulatory Visit (HOSPITAL_COMMUNITY): Payer: Self-pay

## 2022-02-08 NOTE — Telephone Encounter (Signed)
Pharmacy Patient Advocate Encounter   Received notification that prior authorization for Wegovy 0.'25MG'$ /0.5ML auto-injectors is required/requested.  Per Test Claim: PA required   PA submitted on 02/08/22 to (ins) [employer] via CoverMyMeds Key [id number] Status is pending

## 2022-02-08 NOTE — Telephone Encounter (Signed)
PA pending. Key: [id number]

## 2022-02-11 NOTE — Telephone Encounter (Signed)
Pharmacy Patient Advocate Encounter  Received notification from [facility] [employer] that the request for prior authorization for [facility] 0.'25MG'$ /0.5ML auto-injectors has been denied due to not taking part in a comprehensive weight management program for at least 6 months.    Please be advised we currently do not have a Pharmacist to review denials, therefore you will need to process appeals accordingly as needed. Thanks for your support at this time.   You may fax [phone], to appeal.

## 2022-02-11 NOTE — Telephone Encounter (Signed)
Spoke to pt about being denied of Wegovy, And that is was send in twice and it been denied and should call her insurance to figure out what weight loss medication they willing to cover without any restrictions of how to be approved for it. Pt stated she did a weight loss is 2021 and need that to be send, let her know that according to her chart that was send in while PA was being done and it still was denied and that she need to have been doing that within the past 6 month. Pt stated that it showing prior to 6 month, let pt know that all her weight lose medical files are above 6 month and it need to be with the past 6 month. Pt said she has been going to the gym, let pt know that she need a medical forms of her weight loss/ gym with a provider to be able to send forms that is medical form to her insurance.

## 2022-02-11 NOTE — Telephone Encounter (Signed)
Pharmacy Patient Advocate Encounter  Received notification from [facility] [employer] that the request for prior authorization for Wegovy 0.'25MG'$ /0.5ML auto-injectors has been denied due to not taking part in a comprehensive weight management program for at least 6 months.    Please be advised we currently do not have a Pharmacist to review denials, therefore you will need to process appeals accordingly as needed. Thanks for your support at this time.   You may fax [phone], to appeal.

## 2022-02-14 ENCOUNTER — Other Ambulatory Visit (HOSPITAL_BASED_OUTPATIENT_CLINIC_OR_DEPARTMENT_OTHER): Payer: Self-pay

## 2022-02-14 NOTE — Telephone Encounter (Signed)
Pharmacist from [facility] stating pt is currently in a weight loss program and needs PA updated with this information pharmacist inform current documentation is needed of program to be submitted with PA.

## 2022-02-22 ENCOUNTER — Other Ambulatory Visit (HOSPITAL_BASED_OUTPATIENT_CLINIC_OR_DEPARTMENT_OTHER): Payer: Self-pay

## 2022-02-25 NOTE — Telephone Encounter (Signed)
Submitted appeal through [employer] (Key: [id number]). Attached invoice from [place] to request. Also stated that patient is involved in [place] weight and wellness as well as actively sees a nutritionist.

## 2022-03-06 ENCOUNTER — Other Ambulatory Visit (HOSPITAL_BASED_OUTPATIENT_CLINIC_OR_DEPARTMENT_OTHER): Payer: Self-pay

## 2022-03-11 ENCOUNTER — Other Ambulatory Visit (HOSPITAL_BASED_OUTPATIENT_CLINIC_OR_DEPARTMENT_OTHER): Payer: Self-pay

## 2022-03-13 ENCOUNTER — Emergency Department (HOSPITAL_BASED_OUTPATIENT_CLINIC_OR_DEPARTMENT_OTHER)
Admission: EM | Admit: 2022-03-13 | Discharge: 2022-03-13 | Disposition: A | Discharge status: Home / Self Care | Payer: 59 | Attending: Emergency Medicine | Admitting: Emergency Medicine

## 2022-03-13 ENCOUNTER — Other Ambulatory Visit: Payer: Self-pay

## 2022-03-13 ENCOUNTER — Emergency Department (HOSPITAL_BASED_OUTPATIENT_CLINIC_OR_DEPARTMENT_OTHER): Payer: 59

## 2022-03-13 ENCOUNTER — Other Ambulatory Visit (HOSPITAL_BASED_OUTPATIENT_CLINIC_OR_DEPARTMENT_OTHER): Payer: Self-pay

## 2022-03-13 DIAGNOSIS — D72829 Elevated white blood cell count, unspecified: Secondary | ICD-10-CM | POA: Diagnosis not present

## 2022-03-13 DIAGNOSIS — F419 Anxiety disorder, unspecified: Secondary | ICD-10-CM | POA: Diagnosis not present

## 2022-03-13 DIAGNOSIS — N83202 Unspecified ovarian cyst, left side: Secondary | ICD-10-CM | POA: Diagnosis not present

## 2022-03-13 DIAGNOSIS — M545 Low back pain, unspecified: Secondary | ICD-10-CM

## 2022-03-13 DIAGNOSIS — R1030 Lower abdominal pain, unspecified: Secondary | ICD-10-CM | POA: Diagnosis present

## 2022-03-13 LAB — CBC
HCT: 38.1 % (ref 36.0–46.0)
Hemoglobin: 12.8 g/dL (ref 12.0–15.0)
MCH: 29.6 pg (ref 26.0–34.0)
MCHC: 33.6 g/dL (ref 30.0–36.0)
MCV: 88.2 fL (ref 80.0–100.0)
Platelets: 329 10*3/uL (ref 150–400)
RBC: 4.32 MIL/uL (ref 3.87–5.11)
RDW: 14.3 % (ref 11.5–15.5)
WBC: 12.3 10*3/uL — ABNORMAL HIGH (ref 4.0–10.5)
nRBC: 0 % (ref 0.0–0.2)

## 2022-03-13 LAB — COMPREHENSIVE METABOLIC PANEL
ALT: 21 U/L (ref 0–44)
AST: 18 U/L (ref 15–41)
Albumin: 4 g/dL (ref 3.5–5.0)
Alkaline Phosphatase: 61 U/L (ref 38–126)
Anion gap: 7 (ref 5–15)
BUN: 12 mg/dL (ref 6–20)
CO2: 24 mmol/L (ref 22–32)
Calcium: 9.2 mg/dL (ref 8.9–10.3)
Chloride: 107 mmol/L (ref 98–111)
Creatinine, Ser: 0.8 mg/dL (ref 0.44–1.00)
GFR, Estimated: >60 mL/min (ref >60)
Glucose, Bld: 92 mg/dL (ref 70–99)
Potassium: 4.3 mmol/L (ref 3.5–5.1)
Sodium: 138 mmol/L (ref 135–145)
Total Bilirubin: 0.3 mg/dL (ref 0.3–1.2)
Total Protein: 7.1 g/dL (ref 6.5–8.1)

## 2022-03-13 LAB — URINALYSIS, ROUTINE W REFLEX MICROSCOPIC
Bacteria, UA: NONE SEEN
Bilirubin Urine: NEGATIVE
Glucose, UA: NEGATIVE mg/dL
Ketones, ur: NEGATIVE mg/dL
Leukocytes,Ua: NEGATIVE
Nitrite: NEGATIVE
Protein, ur: NEGATIVE mg/dL
Specific Gravity, Urine: >1.046 — ABNORMAL HIGH (ref 1.005–1.030)
pH: 7.5 (ref 5.0–8.0)

## 2022-03-13 LAB — LIPASE, BLOOD: Lipase: 28 U/L (ref 11–51)

## 2022-03-13 LAB — HCG, SERUM, QUALITATIVE: Preg, Serum: NEGATIVE

## 2022-03-13 MED ORDER — ONDANSETRON HCL 4 MG PO TABS
4.0000 mg | ORAL_TABLET | Freq: Three times a day (TID) | ORAL | 0 refills | Status: AC | PRN
  Filled 2022-03-13: qty 9, 3d supply, fill #0

## 2022-03-13 MED ORDER — MORPHINE SULFATE (PF) 4 MG/ML IV SOLN
4.0000 mg | Freq: Once | INTRAVENOUS | Status: AC
  Administered 2022-03-13: 4 mg via INTRAVENOUS
  Filled 2022-03-13: qty 1

## 2022-03-13 MED ORDER — ONDANSETRON HCL 4 MG/2ML IJ SOLN
4.0000 mg | Freq: Once | INTRAMUSCULAR | Status: AC
  Administered 2022-03-13: 4 mg via INTRAVENOUS
  Filled 2022-03-13: qty 2

## 2022-03-13 MED ORDER — OXYCODONE HCL 5 MG PO TABS
5.0000 mg | ORAL_TABLET | ORAL | 0 refills | Status: AC | PRN
  Filled 2022-03-13: qty 9, 2d supply, fill #0

## 2022-03-13 MED ORDER — SODIUM CHLORIDE 0.9 % IV BOLUS
500.0000 mL | Freq: Once | INTRAVENOUS | Status: AC
  Administered 2022-03-13: 500 mL via INTRAVENOUS

## 2022-03-13 MED ORDER — IBUPROFEN 600 MG PO TABS
600.0000 mg | ORAL_TABLET | Freq: Four times a day (QID) | ORAL | 0 refills | Status: AC | PRN
  Filled 2022-03-13: qty 9, 3d supply, fill #0

## 2022-03-13 MED ORDER — IOHEXOL 300 MG/ML  SOLN
100.0000 mL | Freq: Once | INTRAMUSCULAR | Status: AC | PRN
  Administered 2022-03-13: 100 mL via INTRAVENOUS

## 2022-03-13 MED ORDER — SODIUM CHLORIDE 0.9 % IV BOLUS: 500.0000 mL | Freq: Once | INTRAVENOUS | Status: DC

## 2022-03-13 MED ORDER — OXYCODONE HCL 5 MG PO TABS
5.0000 mg | ORAL_TABLET | ORAL | 0 refills | Status: DC | PRN
  Filled 2022-03-13: qty 18, 3d supply, fill #0

## 2022-03-13 NOTE — Discharge Instructions (Addendum)
Thank you for letting us take care of you today.  Your workup was reassuring.  As discussed, you do have a cyst on your left ovary but there are no signs in your blood work, on your CT scan, or on your ultrasound of an emergent condition requiring surgery or admission to the hospital.  With your continued pain, I am prescribing a small amount of pain medication for you to take at home until you can follow-up with your doctor.  Please take the oxycodone only as needed for severe pain.  For mild to moderate pain, please take Tylenol 1000 mg every 6 hours and/or ibuprofen 600 mg every 6 hours as needed.  Please follow-up with your PCP in the next 1 to 2 days to discuss your ED visit today.  Please keep the OB/GYN appointment you have on 3/4 for follow-up.  Please discuss of the ovarian cyst with your OB/GYN.  I also recommend that you follow-up with your previous pain management doctor who is treating her back pain.  They may want to do injections into her back or other procedures/medications to control your pain if you continue to have symptoms.  If you develop any worsening of your condition or new symptoms, please return to the nearest emergency department for reevaluation.

## 2022-03-13 NOTE — ED Notes (Signed)
Pt aware of the need for a urine... Unable to currently provide.Marland KitchenMarland Kitchen

## 2022-03-13 NOTE — ED Provider Notes (Signed)
[facility] Provider Note   CSN: [id number] Arrival date & time: 03/13/22  1532     History  Chief Complaint  Patient presents with   Abdominal Pain    Caroline Carlson is a 42 y.o. female who presents to the ED complaining of severe lower abdominal and lower back pain that started yesterday. Pain is worse in the lower back with movement. She denies nausea, vomiting, diarrhea, fever, chills, chest pain, dysuria, or shortness of breath.  She states that when she wiped this morning there was a very small drop of blood but she is unsure where this came from.  She has never had an EGD or colonoscopy.  She previously had a hysterectomy and has not had menstrual cycles since 2014. She took an oxycodone at home this morning without relief of her pain. She denies history of kidney stones or other kidney problems.  She denies vaginal discharge or concern for STIs.  She does have a history of recurrent back pain for which she was previously followed by pain management and has received trigger injections and other medications in the past.  She does also have a history of endometriosis that previously required surgical intervention.  She has previously had a partial hysterectomy but still has her ovaries and tubes.       Home Medications Prior to Admission medications   Medication Sig Start Date End Date Taking? Authorizing Provider  ibuprofen (ADVIL) 600 MG tablet Take 1 tablet (600 mg total) by mouth every 6 (six) hours as needed for up to 3 days for mild pain or moderate pain. 03/13/22 03/17/22 Yes Hubbert Landrigan L, PA-C  ondansetron (ZOFRAN) 4 MG tablet Take 1 tablet (4 mg total) by mouth every 8 (eight) hours as needed for up to 3 days for nausea or vomiting. 03/13/22 03/17/22 Yes Erion Weightman L, PA-C  cyclobenzaprine (FLEXERIL) 5 MG tablet Take 1 tablet (5 mg total) by mouth 3 (three) times daily as needed for muscle spasms. 01/18/22   Hoyt Koch, MD  FINACEA 15 % FOAM APPLY 0.15 G TOPICALLY IN THE MORNING AND AT BEDTIME. 02/06/22   Hoyt Koch, MD  oxyCODONE (ROXICODONE) 5 MG immediate release tablet Take 1 tablet (5 mg total) by mouth every 4 (four) hours as needed for up to 3 days for severe pain. 03/13/22 03/16/22  Maryetta Shafer L, PA-C  pantoprazole (PROTONIX) 40 MG tablet Take 1 tablet (40 mg total) by mouth daily. 08/01/21   Hoyt Koch, MD  Semaglutide-Weight Management 0.25 MG/0.5ML SOAJ Inject 0.25 mg into the skin once weekly 01/18/22     Semaglutide-Weight Management 0.5 MG/0.5ML SOAJ Inject 0.5 mg into the skin once a week for 28 days. 02/16/22 03/16/22  Hoyt Koch, MD  Semaglutide-Weight Management 1 MG/0.5ML SOAJ Inject 1 mg into the skin once a week for 28 days. 03/17/22 04/14/22  Hoyt Koch, MD  Semaglutide-Weight Management 1.7 MG/0.75ML SOAJ Inject 1.7 mg into the skin once a week for 28 days. 04/15/22 05/13/22  Hoyt Koch, MD  Semaglutide-Weight Management 2.4 MG/0.75ML SOAJ Inject 2.4 mg into the skin once a week for 28 days. 05/14/22 06/11/22  Hoyt Koch, MD  tretinoin (RETIN-A) 0.1 % cream APPLY TO AFFECTED AREA EVERY DAY AT BEDTIME 02/06/22   Hoyt Koch, MD      Allergies    Patient has no known allergies.    Review of Systems   Review of Systems  All other systems reviewed and are negative.   Physical Exam Updated Vital Signs BP 105/70   Pulse 73   Temp 98.6 F (37 C) (Oral)   Resp 11   Ht '5\' 5"'$  (1.651 m)   Wt 108.9 kg   LMP 09/11/2012   SpO2 95%   BMI 39.94 kg/m  Physical Exam Vitals and nursing note reviewed.  Constitutional:      General: She is not in acute distress.    Appearance: Normal appearance. She is obese. She is not toxic-appearing.  HENT:     Head: Normocephalic and atraumatic.     Mouth/Throat:     Mouth: Mucous membranes are moist.  Eyes:     General: No scleral icterus.    Extraocular Movements: Extraocular movements  intact.     Conjunctiva/sclera: Conjunctivae normal.     Pupils: Pupils are equal, round, and reactive to light.  Cardiovascular:     Rate and Rhythm: Normal rate and regular rhythm.     Heart sounds: Normal heart sounds. No murmur heard. Pulmonary:     Effort: Pulmonary effort is normal. No respiratory distress.     Breath sounds: Normal breath sounds. No stridor. No wheezing, rhonchi or rales.  Chest:     Chest wall: No tenderness.  Abdominal:     General: Abdomen is flat.     Palpations: Abdomen is soft. There is no fluid wave or pulsatile mass.     Tenderness: There is generalized abdominal tenderness (mild). There is no right CVA tenderness, left CVA tenderness, guarding or rebound. Negative signs include Murphy's sign, Rovsing's sign and McBurney's sign.  Musculoskeletal:        General: Normal range of motion.     Cervical back: Neck supple.     Right lower leg: No edema.     Left lower leg: No edema.  Skin:    General: Skin is warm and dry.     Capillary Refill: Capillary refill takes less than 2 seconds.  Neurological:     Mental Status: She is alert. Mental status is at baseline.     Motor: Motor function is intact.  Psychiatric:        Mood and Affect: Mood is anxious. Affect is tearful.        Speech: Speech normal.        Behavior: Behavior normal. Behavior is cooperative.        Thought Content: Thought content normal.     ED Results / Procedures / Treatments   Labs (all labs ordered are listed, but only abnormal results are displayed) Labs Reviewed  CBC - Abnormal; Notable for the following components:      Result Value   WBC 12.3 (*)    All other components within normal limits  URINALYSIS, ROUTINE W REFLEX MICROSCOPIC - Abnormal; Notable for the following components:   Color, Urine COLORLESS (*)    Specific Gravity, Urine >1.046 (*)    Hgb urine dipstick TRACE (*)    All other components within normal limits  LIPASE, BLOOD  COMPREHENSIVE METABOLIC  PANEL  HCG, SERUM, QUALITATIVE    EKG EKG Interpretation  Date/Time:  Wednesday March 13 2022 16:26:08 EST Ventricular Rate:  72 PR Interval:  150 QRS Duration: 93 QT Interval:  400 QTC Calculation: 438 R Axis:   87 Text Interpretation: Sinus arrhythmia Low voltage, precordial leads Borderline T abnormalities, anterior leads Confirmed by Octaviano Glow [phone]) on 03/13/2022 4:39:39 PM  Radiology US PELVIC COMPLETE W TRANSVAGINAL  AND TORSION R/O  Result Date: 03/13/2022 CLINICAL DATA:  Initial evaluation for acute pelvic pain. History of prior hysterectomy. EXAM: TRANSABDOMINAL AND TRANSVAGINAL ULTRASOUND OF PELVIS DOPPLER ULTRASOUND OF OVARIES TECHNIQUE: Both transabdominal and transvaginal ultrasound examinations of the pelvis were performed. Transabdominal technique was performed for global imaging of the pelvis including uterus, ovaries, adnexal regions, and pelvic cul-de-sac. It was necessary to proceed with endovaginal exam following the transabdominal exam to visualize the ovaries. Color and duplex Doppler ultrasound was utilized to evaluate blood flow to the ovaries. COMPARISON:  Prior CT from 03/13/2022. FINDINGS: Uterus Prior hysterectomy.  No abnormality about the vaginal cuff. Endometrium Surgically absent. Right ovary Not visualized.  No adnexal mass. Left ovary Measurements: 2.9 x 2.9 x 3.6 cm = volume: 16.3 mL. 3.3 x 2.7 x 3.4 cm simple cyst. No significant internal complexity by sonography. No vascularity or solid component. Pulsed Doppler evaluation of the left ovary demonstrates normal low-resistance arterial and venous waveforms. Other findings No abnormal free fluid. IMPRESSION: 1. 3.4 cm simple left ovarian cyst, nonspecific, but likely benign given size and appearance. No follow up imaging recommended. Note: This recommendation does not apply to premenarchal patients or to those with increased risk (genetic, family history, elevated tumor markers or other high-risk factors)  of ovarian cancer. Reference: Radiology 2019 Nov; 293(2):359-371. 2. No evidence for left ovarian torsion. 3. Nonvisualization of the right ovary. No other adnexal mass or free fluid. 4. Prior hysterectomy. Electronically Signed   By: Jeannine Boga M.D.   On: 03/13/2022 20:20   CT ABDOMEN PELVIS W CONTRAST  Result Date: 03/13/2022 CLINICAL DATA:  Acute abdominal pain. Lower abdominal pain. EXAM: CT ABDOMEN AND PELVIS WITH CONTRAST TECHNIQUE: Multidetector CT imaging of the abdomen and pelvis was performed using the standard protocol following bolus administration of intravenous contrast. RADIATION DOSE REDUCTION: This exam was performed according to the departmental dose-optimization program which includes automated exposure control, adjustment of the mA and/or kV according to patient size and/or use of iterative reconstruction technique. CONTRAST:  138m OMNIPAQUE IOHEXOL 300 MG/ML  SOLN COMPARISON:  Noncontrast CT 08/04/2016 FINDINGS: Lower chest: Clear lung bases. Hepatobiliary: No focal liver abnormality is seen. No gallstones, gallbladder wall thickening, or biliary dilatation. Pancreas: Unremarkable. No pancreatic ductal dilatation or surrounding inflammatory changes. Spleen: Normal in size without focal abnormality. Adrenals/Urinary Tract: Normal adrenal glands. No hydronephrosis, perinephric edema or renal calculi. No suspicious renal lesion. Unremarkable urinary bladder. Stomach/Bowel: Unremarkable appearance of the stomach. There is no small bowel obstruction or inflammatory change. Normal appendix. Small to moderate volume of stool in the colon. No colonic wall thickening or pericolonic edema. No significant diverticular disease. Vascular/Lymphatic: Normal caliber abdominal aorta. Patent portal, splenic and mesenteric veins. No acute vascular findings. No adenopathy. Reproductive: Hysterectomy. 3.2 cm low-density lesion in the left adnexa, minimally higher than simple fluid density, likely a  minimally complex cyst. No convincing septations. The right ovary is not seen. Other: No free air, free fluid, or intra-abdominal fluid collection. Small fat containing umbilical hernia. Musculoskeletal: There are no acute or suspicious osseous abnormalities. IMPRESSION: 1. No acute abnormality or explanation for abdominal pain. 2. A 3.2 cm low-density lesion in the left adnexa is likely a minimally complex cyst. No follow-up imaging recommended. Note: This recommendation does not apply to premenarchal patients and to those with increased risk (genetic, family history, elevated tumor markers or other high-risk factors) of ovarian cancer. Reference: JACR 2020 Feb; 17(2):248-254 3. Small fat containing umbilical hernia. Electronically Signed   By: MThreasa Beards  Sanford M.D.   On: 03/13/2022 16:53    Procedures Procedures    Medications Ordered in ED Medications  morphine (PF) 4 MG/ML injection 4 mg (4 mg Intravenous Given 03/13/22 1621)  ondansetron (ZOFRAN) injection 4 mg (4 mg Intravenous Given 03/13/22 1617)  iohexol (OMNIPAQUE) 300 MG/ML solution 100 mL (100 mLs Intravenous Contrast Given 03/13/22 1632)  morphine (PF) 4 MG/ML injection 4 mg (4 mg Intravenous Given 03/13/22 1822)  sodium chloride 0.9 % bolus 500 mL (0 mLs Intravenous Stopped 03/13/22 2131)    ED Course/ Medical Decision Making/ A&P                             Medical Decision Making Amount and/or Complexity of Data Reviewed Labs: ordered. Decision-making details documented in ED Course. Radiology: ordered. Decision-making details documented in ED Course. ECG/medicine tests: ordered. Decision-making details documented in ED Course.  Risk Prescription drug management.   Medical Decision Making:   PUANANI SCHARRER is a 42 y.o. female who presented to the ED today with abdominal pain, detailed above.    Additional history discussed with patient's family/caregivers.  Patient's presentation is complicated by their history of  morbid obesity, chronic back pain, endometriosis, psychiatric comorbidities.  Complete initial physical exam performed, notably the patient was in no acute distress with mild generalized abdominal tenderness without rebound, guarding, signs of distention, or other peritoneal signs.  She is neurologically intact.  She does appear slightly anxious and tearful.  Reviewed and confirmed nursing documentation for past medical history, family history, social history.    Initial Assessment:   With the patient's presentation of abdominal pain, many differential diagnoses were considered including (but not limited to) gastroenteritis, colitis, small bowel obstruction, appendicitis, cholecystitis, pancreatitis, nephrolithiasis, UTI, pyleonephritis, PID, ovarian torsion. These are considered less likely due to history of present illness and physical exam findings.   This is most consistent with an acute complicated illness   Initial Plan:  CBC/CMP to evaluate for underlying infectious/metabolic etiology for patient's abdominal pain  Lipase to evaluate for pancreatitis  EKG to evaluate for cardiac source of pain  CTAB/Pelvis with contrast to evaluate for structural/surgical etiology of patients' severe abdominal pain.  Pelvic US to evaluate for torsion  Urinalysis and repeat physical assessment to evaluate for UTI/Pyelonpehritis  Empiric management of symptoms with escalating pain control and antiemetics as needed.   Initial Study Results:   Laboratory  All laboratory results reviewed without evidence of clinically relevant pathology.   Exceptions include: WBC 12.3, trace hematuria   EKG EKG was reviewed independently. Rate, rhythm, axis, intervals all examined and without medically relevant abnormality. ST segments without concerns for elevations.    Radiology All images reviewed independently. Agree with radiology report at this time.   US PELVIC COMPLETE W TRANSVAGINAL AND TORSION R/O  Result  Date: 03/13/2022 CLINICAL DATA:  Initial evaluation for acute pelvic pain. History of prior hysterectomy. EXAM: TRANSABDOMINAL AND TRANSVAGINAL ULTRASOUND OF PELVIS DOPPLER ULTRASOUND OF OVARIES TECHNIQUE: Both transabdominal and transvaginal ultrasound examinations of the pelvis were performed. Transabdominal technique was performed for global imaging of the pelvis including uterus, ovaries, adnexal regions, and pelvic cul-de-sac. It was necessary to proceed with endovaginal exam following the transabdominal exam to visualize the ovaries. Color and duplex Doppler ultrasound was utilized to evaluate blood flow to the ovaries. COMPARISON:  Prior CT from 03/13/2022. FINDINGS: Uterus Prior hysterectomy.  No abnormality about the vaginal cuff. Endometrium Surgically absent. Right ovary  Not visualized.  No adnexal mass. Left ovary Measurements: 2.9 x 2.9 x 3.6 cm = volume: 16.3 mL. 3.3 x 2.7 x 3.4 cm simple cyst. No significant internal complexity by sonography. No vascularity or solid component. Pulsed Doppler evaluation of the left ovary demonstrates normal low-resistance arterial and venous waveforms. Other findings No abnormal free fluid. IMPRESSION: 1. 3.4 cm simple left ovarian cyst, nonspecific, but likely benign given size and appearance. No follow up imaging recommended. Note: This recommendation does not apply to premenarchal patients or to those with increased risk (genetic, family history, elevated tumor markers or other high-risk factors) of ovarian cancer. Reference: Radiology 2019 Nov; 293(2):359-371. 2. No evidence for left ovarian torsion. 3. Nonvisualization of the right ovary. No other adnexal mass or free fluid. 4. Prior hysterectomy. Electronically Signed   By: Jeannine Boga M.D.   On: 03/13/2022 20:20   CT ABDOMEN PELVIS W CONTRAST  Result Date: 03/13/2022 CLINICAL DATA:  Acute abdominal pain. Lower abdominal pain. EXAM: CT ABDOMEN AND PELVIS WITH CONTRAST TECHNIQUE: Multidetector CT  imaging of the abdomen and pelvis was performed using the standard protocol following bolus administration of intravenous contrast. RADIATION DOSE REDUCTION: This exam was performed according to the departmental dose-optimization program which includes automated exposure control, adjustment of the mA and/or kV according to patient size and/or use of iterative reconstruction technique. CONTRAST:  158m OMNIPAQUE IOHEXOL 300 MG/ML  SOLN COMPARISON:  Noncontrast CT 08/04/2016 FINDINGS: Lower chest: Clear lung bases. Hepatobiliary: No focal liver abnormality is seen. No gallstones, gallbladder wall thickening, or biliary dilatation. Pancreas: Unremarkable. No pancreatic ductal dilatation or surrounding inflammatory changes. Spleen: Normal in size without focal abnormality. Adrenals/Urinary Tract: Normal adrenal glands. No hydronephrosis, perinephric edema or renal calculi. No suspicious renal lesion. Unremarkable urinary bladder. Stomach/Bowel: Unremarkable appearance of the stomach. There is no small bowel obstruction or inflammatory change. Normal appendix. Small to moderate volume of stool in the colon. No colonic wall thickening or pericolonic edema. No significant diverticular disease. Vascular/Lymphatic: Normal caliber abdominal aorta. Patent portal, splenic and mesenteric veins. No acute vascular findings. No adenopathy. Reproductive: Hysterectomy. 3.2 cm low-density lesion in the left adnexa, minimally higher than simple fluid density, likely a minimally complex cyst. No convincing septations. The right ovary is not seen. Other: No free air, free fluid, or intra-abdominal fluid collection. Small fat containing umbilical hernia. Musculoskeletal: There are no acute or suspicious osseous abnormalities. IMPRESSION: 1. No acute abnormality or explanation for abdominal pain. 2. A 3.2 cm low-density lesion in the left adnexa is likely a minimally complex cyst. No follow-up imaging recommended. Note: This recommendation  does not apply to premenarchal patients and to those with increased risk (genetic, family history, elevated tumor markers or other high-risk factors) of ovarian cancer. Reference: JACR 2020 Feb; 17(2):248-254 3. Small fat containing umbilical hernia. Electronically Signed   By: MKeith RakeM.D.   On: 03/13/2022 16:53     Final Reassessment and Plan:   This is a 42year old female presenting to the ED with complaints of lower abdominal and lower back pain.  Previous abdominal surgeries include partial hysterectomy.  Patient's history complicated by a history of chronic back pain and fibromyalgia in addition to multiple psychiatric comorbidities.  On exam, she does have mild diffuse abdominal tenderness that is difficult to localize and appears slightly anxious but otherwise has a benign physical exam.  Abdomen is soft without rebound, guarding, or peritoneal signs.  She does not appear clinically dehydrated but does request IV fluids so ordered small  fluid bolus during ED stay.  Blood work essentially unremarkable apart from a very mild leukocytosis at 12.3.  EKG without any significant acute changes.  With patient's symptoms of lower abdominal and lower back pain, do not suspect ACS in the setting of no chest pain and his EKG without acute changes.  UA does not appear infected but does have trace hematuria.  CT scan of the abdomen shows no acute abnormality but does show a likely 3.2 cm ovarian cyst on the left.  Unfortunately, CT scan not ideal for characterizing the cyst.  Ultrasound obtained to rule out ovarian torsion or other pelvic emergency.  Ultrasound showed a simple ovarian cyst on the left without signs of torsion.  Patient has OB/GYN follow-up for her annual exam in early March.  On multiple reexaminations patient's, patient expresses that she is still having abdominal pain despite retesting of pain medication.  She remains with stable vital signs and is in no acute distress.  Discussed patient  case and findings with patient/husband.  Patient expressed frustration regarding persistence of her pain.  With ovarian cyst, discussed with patient that this could be contributing to her pain but there is likely a component of her history of chronic pain that is playing a role as well.  Discussed with patient recommendation for pelvic exam for further examination.  She denies any possibility of STDs and has no history of PID.  She she denies vaginal discharge.  She states that she had 1 episode where she had a drop of blood when wiping but she is unsure if this came from her vagina or rectum.  She has had no more bleeding since that time.  On multiple discussions with patient, patient declines pelvic examination and will opt for follow-up with her OB/GYN this upcoming week.  With patient's persistent pain, will discharge her home with small amount of pain medication and nausea medication.  Discussed importance of outpatient follow-up for her symptoms and patient/husband given strict ED return precautions.  All questions answered and patient stable at time of discharge.            Final Clinical Impression(s) / ED Diagnoses Final diagnoses:  Left ovarian cyst  Acute bilateral low back pain without sciatica    Rx / DC Orders ED Discharge Orders          Ordered    ondansetron (ZOFRAN) 4 MG tablet  Every 8 hours PRN        03/13/22 2038    oxyCODONE (ROXICODONE) 5 MG immediate release tablet  Every 4 hours PRN,   Status:  Discontinued        03/13/22 2038    ibuprofen (ADVIL) 600 MG tablet  Every 6 hours PRN        03/13/22 2038    oxyCODONE (ROXICODONE) 5 MG immediate release tablet  Every 4 hours PRN        03/13/22 2038              Suzzette Righter, PA-C 03/15/22 0001    Wyvonnia Dusky, MD 03/15/22 [phone]

## 2022-03-13 NOTE — ED Triage Notes (Signed)
Patient here POV from Home.  Endorses Lower ABD Pain that began yesterday. Noted some Blood when wiping today after using BR. States her Pain has been present for a Few weeks but subsided somewhat until yesterday.   No Dysuria. No Fever. No Diarrhea.   Some Lower Back Pain that been present for some time.   Uncomfortable during Triage. A&Ox4. GCS 15. BIB Wheelchair.

## 2022-03-13 NOTE — ED Notes (Signed)
Provider aware of the Pelvic cart and the Hemocult card being placed in the room.

## 2022-03-14 ENCOUNTER — Other Ambulatory Visit (HOSPITAL_BASED_OUTPATIENT_CLINIC_OR_DEPARTMENT_OTHER): Payer: Self-pay

## 2022-03-14 ENCOUNTER — Telehealth: Payer: Self-pay

## 2022-03-14 NOTE — Transitions of Care (Post Inpatient/ED Visit) (Signed)
   03/14/2022  Name: EMILEAH LUKOSE MRN: [id number] DOB: [birth year]  Today's TOC FU Call Status: Today's TOC FU Call Status:: Successful TOC FU Call Competed TOC FU Call Complete Date: 03/14/22  Transition Care Management Follow-up Telephone Call Date of Discharge: 03/13/22 Discharge Facility: [facility] ([facility]) Type of Discharge: Emergency Department Reason for ED Visit: Other: (ovarian cyst) How have you been since you were released from the hospital?: Same Any questions or concerns?: No  Items Reviewed: Did you receive and understand the discharge instructions provided?: Yes Medications obtained and verified?: Yes (Medications Reviewed) Any new allergies since your discharge?: No Dietary orders reviewed?: NA Do you have support at home?: Yes People in Home: child(ren), adult  Home Care and Equipment/Supplies: [facility] Ordered?: NA Any new equipment or medical supplies ordered?: NA  Functional Questionnaire: Do you need assistance with bathing/showering or dressing?: No Do you need assistance with meal preparation?: No Do you need assistance with eating?: No Do you have difficulty maintaining continence: No Do you need assistance with getting out of bed/getting out of a chair/moving?: No Do you have difficulty managing or taking your medications?: No  Folllow up appointments reviewed: PCP Follow-up appointment confirmed?: Yes Date of PCP follow-up appointment?: 03/15/22 Follow-up Provider: Dr Sharlet Salina [facility] Follow-up appointment confirmed?: NA Do you need transportation to your follow-up appointment?: No Do you understand care options if your condition(s) worsen?: Yes-patient verbalized understanding    [place], [place] Direct Dial [phone]

## 2022-03-15 ENCOUNTER — Encounter: Payer: Self-pay | Admitting: Internal Medicine

## 2022-03-15 ENCOUNTER — Ambulatory Visit: Payer: 59 | Admitting: Internal Medicine

## 2022-03-15 ENCOUNTER — Ambulatory Visit (INDEPENDENT_AMBULATORY_CARE_PROVIDER_SITE_OTHER): Payer: 59 | Admitting: Internal Medicine

## 2022-03-15 VITALS — BP 110/78 | HR 87 | Temp 98.0°F | Ht 65.0 in | Wt 239.0 lb

## 2022-03-15 DIAGNOSIS — N83202 Unspecified ovarian cyst, left side: Secondary | ICD-10-CM | POA: Diagnosis not present

## 2022-03-15 MED ORDER — KETOROLAC TROMETHAMINE 30 MG/ML IJ SOLN
30.0000 mg | Freq: Once | INTRAMUSCULAR | Status: AC
  Administered 2022-03-15: 30 mg via INTRAMUSCULAR

## 2022-03-15 NOTE — Telephone Encounter (Signed)
Please for ovarian cyst needs follow up with her ob/gyn. Ok to see [place] too but needs to schedule with her ob/gyn for this f/u

## 2022-03-15 NOTE — Progress Notes (Signed)
   Subjective:   Patient ID: Caroline Carlson, female    DOB: 1980/09/16, 42 y.o.   MRN: [id number]  HPI The patient is a 42 YO female coming in for ER follow up (low abdomen pain, CT and labs and US done consistent with left ovary cyst, no F/U imaging required). She was given rx ibuprofen and oxycodone. She was not explained how to take or why. Did not fill meds due to pharmacy being closed when sent in and has taken some of family member old oxycodone same mg without significant relief. Is now constipated since ER visit has not gone. Severe pain still 10/10. Seeing ob/gyn next week for yearly   PMH, [facility], social history reviewed and updated  Review of Systems  Constitutional: Negative.   HENT: Negative.    Eyes: Negative.   Respiratory:  Negative for cough, chest tightness and shortness of breath.   Cardiovascular:  Negative for chest pain, palpitations and leg swelling.  Gastrointestinal:  Positive for abdominal pain. Negative for abdominal distention, constipation, diarrhea, nausea and vomiting.  Genitourinary:  Positive for pelvic pain.  Musculoskeletal:  Positive for arthralgias, back pain and myalgias.  Skin: Negative.   Neurological: Negative.   Psychiatric/Behavioral: Negative.      Objective:  Physical Exam Constitutional:      Appearance: She is well-developed. She is obese.     Comments: Appears in pain  HENT:     Head: Normocephalic and atraumatic.  Cardiovascular:     Rate and Rhythm: Normal rate and regular rhythm.  Pulmonary:     Effort: Pulmonary effort is normal. No respiratory distress.     Breath sounds: Normal breath sounds. No wheezing or rales.  Abdominal:     General: Bowel sounds are normal. There is no distension.     Palpations: Abdomen is soft.     Tenderness: There is no abdominal tenderness. There is no rebound.  Musculoskeletal:     Cervical back: Normal range of motion.  Skin:    General: Skin is warm and dry.  Neurological:     Mental  Status: She is alert and oriented to person, place, and time.     Coordination: Coordination normal.     Vitals:   03/15/22 1019  BP: 110/78  Pulse: 87  Temp: 98 F (36.7 C)  TempSrc: Oral  SpO2: 97%  Weight: 239 lb (108.4 kg)  Height: '5\' 5"'$  (1.651 m)   Visit time 20 minutes in face to face communication with patient and coordination of care, additional 10 minutes spent in record review, coordination or care, ordering tests, communicating/referring to other healthcare professionals, documenting in medical records all on the same day of the visit for total time 30 minutes spent on the visit.   Assessment & Plan:  Toradol 30 mg IM given at visit

## 2022-03-15 NOTE — Patient Instructions (Signed)
Make sure to take the ibuprofen 600 mg 3 times a day on a schedule for the next week.   We have given you a toradol shot today for the pain.   It is okay to fill the oxycodone but try not to take this as it will make the constipation worse and constipation will cause the ovary to hurt more.   Start taking some colace or other medicine to help your constipation so you can avoid straining as this will hurt the ovary more.

## 2022-03-15 NOTE — Assessment & Plan Note (Addendum)
Given toradol 30 mg IM at visit to help with pain. Explained that NSAIDs are typically most effective for this pain. Encouraged to fill and take ibuprofen 600 mg TID for next 1-2 weeks. Encouraged to use oxycodone only when needed as this causes constipation and constipation can worsen pelvis pain due to pressure. Advised to take otc for constipation to avoid straining since she is now constipated from oxycodone. She has follow up with ob/gyn soon and likely follow up imaging is not needed. If pain does not improve with appropriate treatment f/u imaging could be considered.

## 2022-03-18 DIAGNOSIS — G8929 Other chronic pain: Secondary | ICD-10-CM | POA: Diagnosis not present

## 2022-03-25 ENCOUNTER — Other Ambulatory Visit (HOSPITAL_BASED_OUTPATIENT_CLINIC_OR_DEPARTMENT_OTHER): Payer: Self-pay

## 2022-03-26 ENCOUNTER — Other Ambulatory Visit (HOSPITAL_COMMUNITY): Payer: Self-pay

## 2022-03-26 ENCOUNTER — Other Ambulatory Visit (HOSPITAL_BASED_OUTPATIENT_CLINIC_OR_DEPARTMENT_OTHER): Payer: Self-pay

## 2022-03-26 NOTE — Telephone Encounter (Signed)
Patient Advocate Encounter  Appeal for M[facility]ons has been approved.    Effective dates: 03/25/22 through 10/25/22  Approval letter attached to chart

## 2022-03-26 NOTE — Telephone Encounter (Signed)
Called patient and informed her about her wegovy and that she is approved.

## 2022-04-01 ENCOUNTER — Encounter: Payer: 59 | Attending: Internal Medicine | Admitting: Dietician

## 2022-04-01 ENCOUNTER — Encounter: Payer: Self-pay | Admitting: Dietician

## 2022-04-01 VITALS — Wt 241.0 lb

## 2022-04-01 DIAGNOSIS — Z713 Dietary counseling and surveillance: Secondary | ICD-10-CM | POA: Diagnosis not present

## 2022-04-01 DIAGNOSIS — Z6841 Body Mass Index (BMI) 40.0 and over, adult: Secondary | ICD-10-CM | POA: Insufficient documentation

## 2022-04-01 DIAGNOSIS — R7303 Prediabetes: Secondary | ICD-10-CM | POA: Insufficient documentation

## 2022-04-01 NOTE — Patient Instructions (Signed)
Previous Goals: Aim for 150 minutes of physical activity weekly. -in progress Aim to make 1/2 of your plate vegetables and/or fruit at least 2x/day - in progress Make simple meals at home more often than eating out. - in progress Aim to eat within 1-2 hours of waking up and every 3-5 hours following. - in progress  New Goals: Go the gym 5 days per week (once you start feeling better).

## 2022-04-01 NOTE — Progress Notes (Signed)
Medical Nutrition Therapy  Appointment Start time:  [id number]  Appointment End time:  1639  Primary concerns today: Pt states she wants to talk about prediabetes and she feels like she is eating right but is still gaining weight.   Referral diagnosis: prediabetes Preferred learning style: no preference indicated Learning readiness: ready   NUTRITION ASSESSMENT   Anthropometrics  Ht: 65in Wt 12/12/21: 237 lbs Wt 01/31/22: 241 lbs Wt 04/01/22: 241 lbs  Clinical Medical Hx: anemia, anxiety, blood transfusion, depression, HLD, nerve/muscle disease Medications: semaglutide Labs: 11/01/21: chol 210, ldl 133, A1c 6.4% Notable Signs/Symptoms: constipation Food Allergies: shellfish  Lifestyle & Dietary Hx  Pt states she thinks she has an ovarian cyst that's been causing her some pain which has been making it difficult to go to the gym, cook, etc.   Pt states she has been still going to the gym. Pt was previously going 5x/wk with her daughter but now is going around 2-3x/wk. Pt reports she likes getting into the heated pool and walking.   Pt states she has been cooking less at home. She reports they have been getting more takeout and her husband has been cooking more.   Pt states she just got her semaglutide last week and took the first dose.   Pt states she has been constipated since being on pain medications after her ER visit for the ovarian cyst but she started taking metamucil and stool softeners which are helping now and she is back on a regular bowel movement schedule.    Pt states she hasn't been craving sweets as much at night.   Estimated daily fluid intake: 64 oz Supplements: vitamin D, magnesium Sleep: 5 hours, pt states she has been sleeping better.  Stress / self-care: high stress Current average weekly physical activity: gym 3x/wk for 60 minutes - weight training and treadmill  24-Hr Dietary Recall First Meal: none OR oatmeal with flaxseeds and chia seeds with dried fruit  (from [employer]) Snack: none Second Meal: BLT sandwich Snack: none OR nuts  Third Meal: tacos OR fried fish with cole slaw and fires OR cookout Snack: none  Beverages: 2L water, occasional cranberry juice   NUTRITION DIAGNOSIS  NB-1.1 Food and nutrition-related knowledge deficit As related to prediabetes.  As evidenced by pt report and diet history.   NUTRITION INTERVENTION  Nutrition education (E-1) on the following topics:  Importance of adequate nutrients/protein while healing from illness/injury Impact of stress on blood glucose MyPlate Building balanced meals and snacks Importance of finding time for self-care Impact of steroid medications on blood glucose Stress management  Handouts Provided Include  No handouts provided on this follow-up  Learning Style & Readiness for Change Teaching method utilized: Visual & Auditory  Demonstrated degree of understanding via: Teach Back  Barriers to learning/adherence to lifestyle change: none  Goals Established by Pt Previous Goals: Aim for 150 minutes of physical activity weekly. -in progress Aim to make 1/2 of your plate vegetables and/or fruit at least 2x/day - in progress Make simple meals at home more often than eating out. - in progress Aim to eat within 1-2 hours of waking up and every 3-5 hours following. - in progress  New Goals: Go the gym 5 days per week (once you start feeling better).    MONITORING & EVALUATION Dietary intake, weekly physical activity, and follow up in 2 months.  Next Steps  Patient is to call for questions.

## 2022-04-16 ENCOUNTER — Other Ambulatory Visit: Payer: Self-pay | Admitting: Internal Medicine

## 2022-04-16 ENCOUNTER — Other Ambulatory Visit (HOSPITAL_COMMUNITY): Payer: Self-pay

## 2022-04-16 NOTE — Telephone Encounter (Signed)
Patient needs this rx sent to [facility]

## 2022-04-17 ENCOUNTER — Other Ambulatory Visit (HOSPITAL_BASED_OUTPATIENT_CLINIC_OR_DEPARTMENT_OTHER): Payer: Self-pay

## 2022-04-17 ENCOUNTER — Other Ambulatory Visit: Payer: Self-pay

## 2022-04-17 MED ORDER — WEGOVY 0.25 MG/0.5ML ~~LOC~~ SOAJ
0.2500 mg | SUBCUTANEOUS | 0 refills | Status: AC
  Filled 2022-04-17: qty 2, 28d supply, fill #0

## 2022-04-18 ENCOUNTER — Other Ambulatory Visit (HOSPITAL_BASED_OUTPATIENT_CLINIC_OR_DEPARTMENT_OTHER): Payer: Self-pay

## 2022-04-18 ENCOUNTER — Other Ambulatory Visit: Payer: Self-pay

## 2022-04-18 MED ORDER — SEMAGLUTIDE-WEIGHT MANAGEMENT 0.5 MG/0.5ML ~~LOC~~ SOAJ
0.5000 mg | SUBCUTANEOUS | 0 refills | Status: DC
  Filled 2022-04-18 (×2): qty 2, 28d supply, fill #0

## 2022-04-18 MED ORDER — SEMAGLUTIDE-WEIGHT MANAGEMENT 1 MG/0.5ML ~~LOC~~ SOAJ
1.0000 mg | SUBCUTANEOUS | 0 refills | Status: DC
  Filled 2022-05-17: qty 2, 28d supply, fill #0

## 2022-04-24 ENCOUNTER — Other Ambulatory Visit (HOSPITAL_BASED_OUTPATIENT_CLINIC_OR_DEPARTMENT_OTHER): Payer: Self-pay

## 2022-05-17 ENCOUNTER — Other Ambulatory Visit (HOSPITAL_BASED_OUTPATIENT_CLINIC_OR_DEPARTMENT_OTHER): Payer: Self-pay

## 2022-06-04 ENCOUNTER — Ambulatory Visit: Payer: 59 | Admitting: Internal Medicine

## 2022-06-04 ENCOUNTER — Encounter: Payer: Self-pay | Admitting: Internal Medicine

## 2022-06-04 ENCOUNTER — Ambulatory Visit (INDEPENDENT_AMBULATORY_CARE_PROVIDER_SITE_OTHER): Payer: 59 | Admitting: Internal Medicine

## 2022-06-04 VITALS — BP 128/68 | HR 89 | Temp 98.6°F | Ht 65.0 in | Wt 228.0 lb

## 2022-06-04 DIAGNOSIS — R7303 Prediabetes: Secondary | ICD-10-CM

## 2022-06-04 DIAGNOSIS — G4452 New daily persistent headache (NDPH): Secondary | ICD-10-CM

## 2022-06-04 DIAGNOSIS — E559 Vitamin D deficiency, unspecified: Secondary | ICD-10-CM | POA: Diagnosis not present

## 2022-06-04 DIAGNOSIS — R5383 Other fatigue: Secondary | ICD-10-CM | POA: Diagnosis not present

## 2022-06-04 DIAGNOSIS — R519 Headache, unspecified: Secondary | ICD-10-CM | POA: Insufficient documentation

## 2022-06-04 LAB — CBC
HCT: 42.4 % (ref 36.0–46.0)
Hemoglobin: 14 g/dL (ref 12.0–15.0)
MCHC: 33 g/dL (ref 30.0–36.0)
MCV: 89.2 fl (ref 78.0–100.0)
Platelets: 363 10*3/uL (ref 150.0–400.0)
RBC: 4.76 Mil/uL (ref 3.87–5.11)
RDW: 14.8 % (ref 11.5–15.5)
WBC: 10 10*3/uL (ref 4.0–10.5)

## 2022-06-04 LAB — COMPREHENSIVE METABOLIC PANEL
ALT: 19 U/L (ref 0–35)
AST: 16 U/L (ref 0–37)
Albumin: 4.3 g/dL (ref 3.5–5.2)
Alkaline Phosphatase: 78 U/L (ref 39–117)
BUN: 13 mg/dL (ref 6–23)
CO2: 24 mEq/L (ref 19–32)
Calcium: 9.8 mg/dL (ref 8.4–10.5)
Chloride: 106 mEq/L (ref 96–112)
Creatinine, Ser: 0.85 mg/dL (ref 0.40–1.20)
GFR: 84.59 mL/min (ref >60.00)
Glucose, Bld: 110 mg/dL — ABNORMAL HIGH (ref 70–99)
Potassium: 3.8 mEq/L (ref 3.5–5.1)
Sodium: 140 mEq/L (ref 135–145)
Total Bilirubin: 0.4 mg/dL (ref 0.2–1.2)
Total Protein: 7.7 g/dL (ref 6.0–8.3)

## 2022-06-04 LAB — VITAMIN D 25 HYDROXY (VIT D DEFICIENCY, FRACTURES): VITD: 28.08 ng/mL — ABNORMAL LOW (ref 30.00–100.00)

## 2022-06-04 LAB — TSH: TSH: 3.03 u[IU]/mL (ref 0.35–5.50)

## 2022-06-04 LAB — VITAMIN B12: Vitamin B-12: 917 pg/mL — ABNORMAL HIGH (ref 211–911)

## 2022-06-04 LAB — HEMOGLOBIN A1C: Hgb A1c MFr Bld: 6.2 % (ref 4.6–6.5)

## 2022-06-04 MED ORDER — SUMATRIPTAN SUCCINATE 25 MG PO TABS: 25.0000 mg | ORAL_TABLET | ORAL | 0 refills | Status: DC | PRN

## 2022-06-04 MED ORDER — ONDANSETRON HCL 4 MG PO TABS: 4.0000 mg | ORAL_TABLET | Freq: Three times a day (TID) | ORAL | 0 refills | Status: DC | PRN

## 2022-06-04 NOTE — Assessment & Plan Note (Signed)
Checking HgA1c as due. Adjust as needed.

## 2022-06-04 NOTE — Assessment & Plan Note (Signed)
Checking vitamin D due to new fatigue. Taking otc at this time.

## 2022-06-04 NOTE — Progress Notes (Signed)
   Subjective:   Patient ID: Caroline Carlson, female    DOB: 1980-05-13, 42 y.o.   MRN: [id number]  Headache  Pertinent negatives include no abdominal pain, coughing, nausea or vomiting.   The patient is a 42 YO female coming in for new headaches. Started once she got to 0.5 mg wegovy and increased when she recently increased to 1 mg wegovy dosing.   Review of Systems  Constitutional: Negative.   HENT: Negative.    Eyes: Negative.   Respiratory:  Negative for cough, chest tightness and shortness of breath.   Cardiovascular:  Negative for chest pain, palpitations and leg swelling.  Gastrointestinal:  Negative for abdominal distention, abdominal pain, constipation, diarrhea, nausea and vomiting.  Musculoskeletal: Negative.   Skin: Negative.   Neurological:  Positive for headaches.  Psychiatric/Behavioral: Negative.      Objective:  Physical Exam Constitutional:      Appearance: She is well-developed.  HENT:     Head: Normocephalic and atraumatic.  Cardiovascular:     Rate and Rhythm: Normal rate and regular rhythm.  Pulmonary:     Effort: Pulmonary effort is normal. No respiratory distress.     Breath sounds: Normal breath sounds. No wheezing or rales.  Abdominal:     General: Bowel sounds are normal. There is no distension.     Palpations: Abdomen is soft.     Tenderness: There is no abdominal tenderness. There is no rebound.  Musculoskeletal:     Cervical back: Normal range of motion.  Skin:    General: Skin is warm and dry.  Neurological:     Mental Status: She is alert and oriented to person, place, and time.     Coordination: Coordination normal.     Vitals:   06/04/22 0828  BP: 128/68  Pulse: 89  Temp: 98.6 F (37 C)  TempSrc: Oral  SpO2: 97%  Weight: 228 lb (103.4 kg)  Height: 5\' 5"  (1.651 m)    Assessment & Plan:

## 2022-06-04 NOTE — Patient Instructions (Signed)
We will check the labs today. 

## 2022-06-04 NOTE — Assessment & Plan Note (Addendum)
Weight down about 10 pounds since starting wegovy and is currently on 1 mg weekly dosing. Will continue with titration. Some nausea and headaches likely side effects of this. Rx zofran for nausea as needed. See separate A/P for headaches. She wishes to continue but if side effects worsen she may need dose reduction and she will keep [place] informed.

## 2022-06-04 NOTE — Assessment & Plan Note (Signed)
New daily headaches and suspect side effect of wegovy. She is experiencing weight loss due to wegovy and enough benefit that she wishes to continue. They have intensified since increasing to 1 mg dosing. We discussed that headaches could either continue at current intensity or worsen or improve with continued dose increase. If she has improvement or stable she would like to continue. For worsening she knows to let us know and we can reduce dose back to manageable level. Rx sumatriptan 25 mg daily prn headaches to use for headache relief.

## 2022-06-06 ENCOUNTER — Encounter: Payer: 59 | Attending: Internal Medicine | Admitting: Dietician

## 2022-06-06 ENCOUNTER — Encounter: Payer: Self-pay | Admitting: Dietician

## 2022-06-06 DIAGNOSIS — R7303 Prediabetes: Secondary | ICD-10-CM | POA: Diagnosis present

## 2022-06-06 NOTE — Progress Notes (Signed)
Medical Nutrition Therapy  Appointment Start time:  1500  Appointment End time:  1530  Primary concerns today: Pt states she wants to talk about prediabetes and she feels like she is eating right but is still gaining weight.   Referral diagnosis: prediabetes Preferred learning style: no preference indicated Learning readiness: ready   NUTRITION ASSESSMENT   Anthropometrics  Ht: 65in Wt 12/12/21: 237 lbs Wt 01/31/22: 241 lbs Wt 04/01/22: 241 lbs  Clinical Medical Hx: anemia, anxiety, blood transfusion, depression, HLD, nerve/muscle disease Medications: semaglutide Labs: 06/04/23: vitamin D 28, A1c 6.2%. 11/01/21: chol 210, ldl 133, A1c 6.4% Notable Signs/Symptoms: constipation Food Allergies: shellfish  Lifestyle & Dietary Hx  This is a virtual visit.  Pt A1c came down from 6.4% in 10/2021 to 6.2% in 05/2022.   Pt states she has been feeling better. Pt started wegovy and noticed its giving her headaches and nausea. She states she was not able to each much at all over the last 2 weeks but she is finally feeling better today.   Pt states she still has an appetite but it has decreased and she no longer has cravings at night.   She states because she's been feeling nauseated she has been skipping meals.   Pt states she notices her pain has been decreasing in her joints.   Estimated daily fluid intake: 64-72 oz Supplements: vitamin D, magnesium Sleep: 5 hours, pt states she has been sleeping better.  Stress / self-care: high stress Current average weekly physical activity: gym 3x/wk for 60 minutes - weight training and treadmill  24-Hr Dietary Recall First Meal: none OR eggs and spinach OR oatmeal with flaxseed Snack: none Second Meal: none Snack: crackers OR jerky Third Meal: spaghetti and vegetables Snack: none  Beverages: 2L water, occasional cranberry juice or ginger ale   NUTRITION DIAGNOSIS  NB-1.1 Food and nutrition-related knowledge deficit As related to  prediabetes.  As evidenced by pt report and diet history.   NUTRITION INTERVENTION  Nutrition education (E-1) on the following topics:  Importance of consistency with eating including having smaller, more frequent meals and snacks when appetite is low. Include a complex carb and protein source. Discussed impact of adequate hydration on health and assessed pts current water intake and goals. Discussed nutritional side effects of wegovy medication with pt and strategies to maintain enough protein intake with reduced appetite. Physical Activity: Aim for 60 minutes of physical activity daily. Regular physical activity promotes overall health-including helping to reduce risk for heart disease and diabetes, promoting mental health, and helping [place] sleep better.  Discussed vitamin D status, sources, supplementation.   Handouts Provided Include  No handouts provided on this follow-up  Learning Style & Readiness for Change Teaching method utilized: Visual & Auditory  Demonstrated degree of understanding via: Teach Back  Barriers to learning/adherence to lifestyle change: none  Goals Established by Pt Continue Previous Goals: Aim for 150 minutes of physical activity weekly. -in progress Aim to make 1/2 of your plate vegetables and/or fruit at least 2x/day - in progress Make simple meals at home more often than eating out. - in progress Aim to eat within 1-2 hours of waking up and every 3-5 hours following. - in progress  New Goal: Exercise 5 days per week.    MONITORING & EVALUATION Dietary intake, weekly physical activity, and follow up in 3 months.  Next Steps  Patient is to call for questions.

## 2022-06-11 ENCOUNTER — Other Ambulatory Visit (HOSPITAL_BASED_OUTPATIENT_CLINIC_OR_DEPARTMENT_OTHER): Payer: Self-pay

## 2022-06-26 DIAGNOSIS — G8929 Other chronic pain: Secondary | ICD-10-CM | POA: Diagnosis not present

## 2022-07-13 ENCOUNTER — Other Ambulatory Visit (HOSPITAL_BASED_OUTPATIENT_CLINIC_OR_DEPARTMENT_OTHER): Payer: Self-pay

## 2022-07-27 ENCOUNTER — Other Ambulatory Visit (HOSPITAL_BASED_OUTPATIENT_CLINIC_OR_DEPARTMENT_OTHER): Payer: Self-pay

## 2022-07-29 ENCOUNTER — Other Ambulatory Visit (HOSPITAL_BASED_OUTPATIENT_CLINIC_OR_DEPARTMENT_OTHER): Payer: Self-pay

## 2022-08-01 ENCOUNTER — Telehealth: Payer: Self-pay | Admitting: Internal Medicine

## 2022-08-01 NOTE — Telephone Encounter (Signed)
Prescription Request  08/01/2022  LOV: 06/04/2022  What is the name of the medication or equipment? wygovey  Have you contacted your pharmacy to request a refill? Yes   Which pharmacy would you like this sent to?   [facility] [address] [place] [address] Phone: [phone] Fax: [phone]    Patient notified that their request is being sent to the clinical staff for review and that they should receive a response within 2 business days.   Please advise at Mobile [phone] (mobile)

## 2022-08-01 NOTE — Telephone Encounter (Signed)
MD is out of the office will hold until she return tomorrow.Marland KitchenJohny Chess

## 2022-08-02 NOTE — Telephone Encounter (Signed)
We asked her to notify if she wanted current dosing 1 mg weekly or reduction (due to headaches) Call and clarify please

## 2022-08-05 ENCOUNTER — Other Ambulatory Visit: Payer: Self-pay | Admitting: Internal Medicine

## 2022-08-06 ENCOUNTER — Ambulatory Visit: Payer: 59 | Admitting: Internal Medicine

## 2022-08-06 MED ORDER — SEMAGLUTIDE-WEIGHT MANAGEMENT 2.4 MG/0.75ML ~~LOC~~ SOAJ: 2.4000 mg | SUBCUTANEOUS | 6 refills | Status: DC

## 2022-08-06 NOTE — Telephone Encounter (Signed)
Notified pt MD sent in the 2.4 mg wegovy.Marland KitchenRaechel Carlson

## 2022-08-06 NOTE — Telephone Encounter (Signed)
Called pt she states she taking the 2.4 mg of the wegovy. The headaches/nausea stop after taking the zofran and Imitrex,,/lmb

## 2022-08-06 NOTE — Telephone Encounter (Signed)
Refill done.  

## 2022-09-05 ENCOUNTER — Ambulatory Visit: Payer: 59 | Admitting: Dietician

## 2022-10-17 ENCOUNTER — Ambulatory Visit (INDEPENDENT_AMBULATORY_CARE_PROVIDER_SITE_OTHER): Payer: 59 | Admitting: Internal Medicine

## 2022-10-17 ENCOUNTER — Encounter: Payer: Self-pay | Admitting: Internal Medicine

## 2022-10-17 MED ORDER — VITAMIN D (ERGOCALCIFEROL) 1.25 MG (50000 UNIT) PO CAPS: 50000.0000 [IU] | ORAL_CAPSULE | ORAL | 0 refills | Status: DC

## 2022-10-17 NOTE — Patient Instructions (Addendum)
You can add zyrtec or flonase to help the sinuses.

## 2022-10-17 NOTE — Progress Notes (Signed)
Subjective:   Patient ID: Caroline Carlson, female    DOB: 04/20/1980, 42 y.o.   MRN: [id number]  HPI The patient is a 42 YO female coming in for follow up wegovy start. Down about 10 pounds since starting. Is taking 2.4 mg weekly for about a few months and feels she may be in plateau and recent loss of close family member which may be impacting weight.  Review of Systems  Constitutional: Negative.   HENT: Negative.    Eyes: Negative.   Respiratory:  Negative for cough, chest tightness and shortness of breath.   Cardiovascular:  Negative for chest pain, palpitations and leg swelling.  Gastrointestinal:  Negative for abdominal distention, abdominal pain, constipation, diarrhea, nausea and vomiting.  Musculoskeletal: Negative.   Skin: Negative.   Neurological: Negative.   Psychiatric/Behavioral: Negative.      Objective:  Physical Exam Constitutional:      Appearance: She is well-developed.  HENT:     Head: Normocephalic and atraumatic.  Cardiovascular:     Rate and Rhythm: Normal rate and regular rhythm.  Pulmonary:     Effort: Pulmonary effort is normal. No respiratory distress.     Breath sounds: Normal breath sounds. No wheezing or rales.  Abdominal:     General: Bowel sounds are normal. There is no distension.     Palpations: Abdomen is soft.     Tenderness: There is no abdominal tenderness. There is no rebound.  Musculoskeletal:     Cervical back: Normal range of motion.  Skin:    General: Skin is warm and dry.  Neurological:     Mental Status: She is alert and oriented to person, place, and time.     Coordination: Coordination normal.     Vitals:   10/17/22 1004  BP: 112/84  Pulse: 75  Temp: 98.3 F (36.8 C)  TempSrc: Oral  SpO2: 96%  Weight: 219 lb (99.3 kg)    Assessment & Plan:

## 2022-10-18 ENCOUNTER — Encounter: Payer: Self-pay | Admitting: Internal Medicine

## 2022-10-18 NOTE — Assessment & Plan Note (Signed)
Tolerating wegovy 2.4 mg weekly well and is doing about 10 pounds per our scales. She wishes to continue and at 6 months we will recheck.

## 2022-10-24 ENCOUNTER — Ambulatory Visit: Payer: 59 | Admitting: Internal Medicine

## 2022-10-29 ENCOUNTER — Ambulatory Visit: Payer: 59 | Admitting: Internal Medicine

## 2022-11-07 ENCOUNTER — Other Ambulatory Visit (HOSPITAL_COMMUNITY): Payer: Self-pay

## 2022-11-07 ENCOUNTER — Telehealth: Payer: Self-pay

## 2022-11-07 NOTE — Telephone Encounter (Signed)
Pharmacy Patient Advocate Encounter   Received notification from CoverMyMeds that prior authorization for [facility] is required/requested.   Insurance verification completed.   The patient is insured through [employer] [facility] .   Per test claim: PA required; PA submitted to [employer] [facility] via CoverMyMeds Key/confirmation #/EOC Key: [id number] Status is pending

## 2022-11-08 ENCOUNTER — Other Ambulatory Visit (HOSPITAL_COMMUNITY): Payer: Self-pay

## 2022-11-08 NOTE — Telephone Encounter (Signed)
Pharmacy Patient Advocate Encounter  Received notification from [employer] that Prior Authorization for [facility] 2.4MG /0.75ML auto-injectors has been APPROVED from 11/07/2023 to 11/07/2023. Ran test claim, Copay is $4.00. This test claim was processed through [facility] copay amounts may vary at other pharmacies due to pharmacy/plan contracts, or as the patient moves through the different stages of their insurance plan.   PA #/Case ID/Reference #: [id number]

## 2022-11-19 ENCOUNTER — Encounter: Payer: Self-pay | Admitting: Internal Medicine

## 2022-11-19 MED ORDER — BENZONATATE 200 MG PO CAPS: 200.0000 mg | ORAL_CAPSULE | Freq: Three times a day (TID) | ORAL | 0 refills | Status: DC | PRN

## 2022-11-19 NOTE — Telephone Encounter (Signed)
Pharmacy is in patient charts

## 2022-11-19 NOTE — Addendum Note (Signed)
Addended by: Hillard Danker A on: 11/19/2022 12:31 PM   Modules accepted: Orders

## 2022-12-23 ENCOUNTER — Encounter: Payer: Self-pay | Admitting: Internal Medicine

## 2022-12-24 ENCOUNTER — Telehealth: Payer: Self-pay | Admitting: Internal Medicine

## 2022-12-24 NOTE — Telephone Encounter (Signed)
Hi! This patient will like to get a call re: Possibly  Changing one of her prescriptions as it was offered to her by Dr. Okey Dupre.  She stated that she sent a message a few days ago through MyChart but has not heard from anyone as of today.   Please contact her @ [phone].  Thanks!  Caroline Carlson.

## 2022-12-25 NOTE — Telephone Encounter (Signed)
Patient is not satisfied with [facility].  She wants to go off this medication and be switched to something else.  Please advise.  Patient wants Caroline Carlson to call her.

## 2022-12-25 NOTE — Telephone Encounter (Signed)
Can you clarify mychart from patient. Last time we met she was down 10 pounds since starting wegovy and should stay on same dose if not having side effects. She was asking to go down in dose which will not help with weight.

## 2022-12-25 NOTE — Telephone Encounter (Signed)
Called and LVM

## 2022-12-26 NOTE — Telephone Encounter (Signed)
Patient has sated to me that since she has been on the 2.4mg  she has not lost any weight but she has gained all her weight back. She wantst o go back to a lower dosage due to the fact that she was losing the weight. She said another medication was mentioned in the visit but couldn't remember and wanted to know if she could try something else

## 2022-12-27 NOTE — Telephone Encounter (Signed)
We could try zepbound instead. I do not recommend to go down in dose. I recall at our visit she is going through a lot in her life right now which could be impacting her weight more than the medicine dose.

## 2022-12-27 NOTE — Telephone Encounter (Signed)
Called pt back and LVM

## 2023-01-01 NOTE — Telephone Encounter (Signed)
Called patient 2nd time

## 2023-01-20 ENCOUNTER — Ambulatory Visit (INDEPENDENT_AMBULATORY_CARE_PROVIDER_SITE_OTHER): Payer: 59 | Admitting: Internal Medicine

## 2023-01-20 ENCOUNTER — Ambulatory Visit (INDEPENDENT_AMBULATORY_CARE_PROVIDER_SITE_OTHER): Payer: 59

## 2023-01-20 ENCOUNTER — Encounter: Payer: Self-pay | Admitting: Internal Medicine

## 2023-01-20 VITALS — BP 122/80 | HR 88 | Temp 98.0°F | Ht 65.0 in | Wt 224.0 lb

## 2023-01-20 DIAGNOSIS — R7303 Prediabetes: Secondary | ICD-10-CM | POA: Diagnosis not present

## 2023-01-20 DIAGNOSIS — M5442 Lumbago with sciatica, left side: Secondary | ICD-10-CM

## 2023-01-20 DIAGNOSIS — R053 Chronic cough: Secondary | ICD-10-CM | POA: Diagnosis not present

## 2023-01-20 DIAGNOSIS — E559 Vitamin D deficiency, unspecified: Secondary | ICD-10-CM

## 2023-01-20 DIAGNOSIS — Z0000 Encounter for general adult medical examination without abnormal findings: Secondary | ICD-10-CM | POA: Diagnosis not present

## 2023-01-20 DIAGNOSIS — E7800 Pure hypercholesterolemia, unspecified: Secondary | ICD-10-CM

## 2023-01-20 LAB — COMPREHENSIVE METABOLIC PANEL
ALT: 20 U/L (ref 0–35)
AST: 15 U/L (ref 0–37)
Albumin: 4.5 g/dL (ref 3.5–5.2)
Alkaline Phosphatase: 92 U/L (ref 39–117)
BUN: 11 mg/dL (ref 6–23)
CO2: 26 meq/L (ref 19–32)
Calcium: 9.4 mg/dL (ref 8.4–10.5)
Chloride: 107 meq/L (ref 96–112)
Creatinine, Ser: 0.76 mg/dL (ref 0.40–1.20)
GFR: 96.33 mL/min (ref >60.00)
Glucose, Bld: 94 mg/dL (ref 70–99)
Potassium: 4 meq/L (ref 3.5–5.1)
Sodium: 141 meq/L (ref 135–145)
Total Bilirubin: 0.4 mg/dL (ref 0.2–1.2)
Total Protein: 7.6 g/dL (ref 6.0–8.3)

## 2023-01-20 LAB — LIPID PANEL
Cholesterol: 284 mg/dL — ABNORMAL HIGH (ref 0–200)
HDL: 58.8 mg/dL (ref >39.00)
LDL Cholesterol: 198 mg/dL — ABNORMAL HIGH (ref 0–99)
NonHDL: 224.93
Total CHOL/HDL Ratio: 5
Triglycerides: 137 mg/dL (ref 0.0–149.0)
VLDL: 27.4 mg/dL (ref 0.0–40.0)

## 2023-01-20 LAB — HEMOGLOBIN A1C: Hgb A1c MFr Bld: 6.1 % (ref 4.6–6.5)

## 2023-01-20 LAB — CBC
HCT: 42.3 % (ref 36.0–46.0)
Hemoglobin: 14 g/dL (ref 12.0–15.0)
MCHC: 33.2 g/dL (ref 30.0–36.0)
MCV: 89.6 fL (ref 78.0–100.0)
Platelets: 360 10*3/uL (ref 150.0–400.0)
RBC: 4.72 Mil/uL (ref 3.87–5.11)
RDW: 14.8 % (ref 11.5–15.5)
WBC: 10.9 10*3/uL — ABNORMAL HIGH (ref 4.0–10.5)

## 2023-01-20 MED ORDER — CYCLOBENZAPRINE HCL 5 MG PO TABS: 5.0000 mg | ORAL_TABLET | Freq: Three times a day (TID) | ORAL | 2 refills | Status: AC | PRN

## 2023-01-20 MED ORDER — ONDANSETRON HCL 4 MG PO TABS: 4.0000 mg | ORAL_TABLET | Freq: Three times a day (TID) | ORAL | 0 refills | Status: AC | PRN

## 2023-01-20 MED ORDER — SUMATRIPTAN SUCCINATE 25 MG PO TABS: 25.0000 mg | ORAL_TABLET | ORAL | 0 refills | Status: AC | PRN

## 2023-01-20 MED ORDER — PANTOPRAZOLE SODIUM 40 MG PO TBEC: 40.0000 mg | DELAYED_RELEASE_TABLET | Freq: Every day | ORAL | 3 refills | Status: AC

## 2023-01-20 MED ORDER — ZEPBOUND 15 MG/0.5ML ~~LOC~~ SOAJ
15.0000 mg | SUBCUTANEOUS | 3 refills | Status: DC
  Filled 2023-01-31 – 2023-04-15 (×2): qty 2, 28d supply, fill #0
  Filled 2023-05-09: qty 2, 28d supply, fill #1
  Filled 2023-06-06: qty 2, 28d supply, fill #2
  Filled 2023-07-04: qty 2, 28d supply, fill #3

## 2023-01-20 MED ORDER — ZEPBOUND 10 MG/0.5ML ~~LOC~~ SOAJ
10.0000 mg | SUBCUTANEOUS | 0 refills | Status: DC
  Filled 2023-01-31: qty 2, 28d supply, fill #0

## 2023-01-20 MED ORDER — FINACEA 15 % EX FOAM: 0.1500 g | Freq: Two times a day (BID) | CUTANEOUS | 6 refills | Status: DC

## 2023-01-20 MED ORDER — ZEPBOUND 12.5 MG/0.5ML ~~LOC~~ SOAJ: 12.5000 mg | SUBCUTANEOUS | 0 refills | Status: DC

## 2023-01-20 NOTE — Patient Instructions (Signed)
 We will check the labs today.

## 2023-01-20 NOTE — Progress Notes (Signed)
   Subjective:   Patient ID: Caroline Carlson, female    DOB: 1980/06/27, 43 y.o.   MRN: [id number]  HPI The patient is here for physical. Also has some acute concerns about congestion and cough going on for 3 months without improvement and wegovy  is not helping her lose weight she wants to switch. She is also having more problems with muscle and back pain lately.   PMH, [facility], social history reviewed and updated  Review of Systems  Constitutional:  Positive for activity change and unexpected weight change. Negative for appetite change, chills, fatigue and fever.  HENT:  Positive for congestion. Negative for ear discharge, ear pain, postnasal drip, rhinorrhea, sinus pressure, sinus pain, sneezing, sore throat, tinnitus, trouble swallowing and voice change.   Eyes: Negative.   Respiratory:  Positive for cough. Negative for chest tightness, shortness of breath and wheezing.   Cardiovascular: Negative.  Negative for chest pain, palpitations and leg swelling.  Gastrointestinal: Negative.  Negative for abdominal distention, abdominal pain, constipation, diarrhea, nausea and vomiting.       GERD minimal  Musculoskeletal:  Positive for arthralgias, back pain and myalgias.  Skin: Negative.   Neurological: Negative.   Psychiatric/Behavioral: Negative.      Objective:  Physical Exam Constitutional:      Appearance: She is well-developed. She is obese.  HENT:     Head: Normocephalic and atraumatic.     Ears:     Comments: Oropharynx normal Cardiovascular:     Rate and Rhythm: Normal rate and regular rhythm.  Pulmonary:     Effort: Pulmonary effort is normal. No respiratory distress.     Breath sounds: Normal breath sounds. No wheezing or rales.  Abdominal:     General: Bowel sounds are normal. There is no distension.     Palpations: Abdomen is soft.     Tenderness: There is no abdominal tenderness. There is no rebound.  Musculoskeletal:        General: Tenderness present.     Cervical  back: Normal range of motion.  Skin:    General: Skin is warm and dry.  Neurological:     Mental Status: She is alert and oriented to person, place, and time.     Coordination: Coordination normal.     Vitals:   01/20/23 1535  BP: 122/80  Pulse: 88  Temp: 98 F (36.7 C)  TempSrc: Oral  SpO2: 96%  Weight: 224 lb (101.6 kg)  Height: 5' 5 (1.651 m)    Assessment & Plan:

## 2023-01-21 DIAGNOSIS — R053 Chronic cough: Secondary | ICD-10-CM | POA: Insufficient documentation

## 2023-01-21 LAB — VITAMIN D 25 HYDROXY (VIT D DEFICIENCY, FRACTURES): VITD: 26.41 ng/mL — ABNORMAL LOW (ref 30.00–100.00)

## 2023-01-21 LAB — VITAMIN B12: Vitamin B-12: 898 pg/mL (ref 211–911)

## 2023-01-21 NOTE — Assessment & Plan Note (Signed)
 Refill flexeril which has helped with pain in the past. Suspect weight gain has possibly triggered as well as doing more over the holidays. No indication for specific imaging today.

## 2023-01-21 NOTE — Assessment & Plan Note (Signed)
 Checking lipid panel and adjust as needed.

## 2023-01-21 NOTE — Assessment & Plan Note (Signed)
 Flu shot declines. Tetanus declines. Mammogram up to date, pap smear up to date. Counseled about sun safety and mole surveillance. Counseled about the dangers of distracted driving. Given 10 year screening recommendations.

## 2023-01-21 NOTE — Assessment & Plan Note (Signed)
 Weight is increasing without specific change in diet. She would like to switch to zepbound from [employer]. Starting with 10 mg weekly for month 1 then increase to 12.5 mg weekly for month 2 then increase and maintain 15 mg weekly.

## 2023-01-21 NOTE — Assessment & Plan Note (Signed)
 New chronic cough in the last 3 months which has been stable. She does have epigastric discomfort and suspected GERD. She is using protonix  prn currently. Denies significant post nasal drip symptoms. Will get CXR to rule out pneumonia. If normal will change protonix  to daily for 1-2 months to see if this helps.

## 2023-01-21 NOTE — Assessment & Plan Note (Signed)
 Checking HgA1c and adjust as needed. Currently taking wegovy.

## 2023-01-21 NOTE — Assessment & Plan Note (Signed)
 Checking vitamin D level and adjust as needed. She is trying to lose weight.

## 2023-01-24 ENCOUNTER — Telehealth: Payer: Self-pay

## 2023-01-24 NOTE — Telephone Encounter (Signed)
 Copied from CRM [id number]. Topic: Clinical - Prescription Issue >> Jan 23, 2023  4:41 PM Sim Boast F wrote: Reason for CRM: Patients zepbound needs prior authorization

## 2023-01-28 ENCOUNTER — Telehealth: Payer: Self-pay

## 2023-01-28 ENCOUNTER — Other Ambulatory Visit (HOSPITAL_COMMUNITY): Payer: Self-pay

## 2023-01-28 NOTE — Telephone Encounter (Signed)
 Pharmacy Patient Advocate Encounter   Received notification from Pt Calls Messages that prior authorization for Zepbound  10mg /0.29ml is required/requested.   Insurance verification completed.   The patient is insured through [facility] .   Per test claim: PA required; PA submitted to above mentioned insurance via CoverMyMeds Key/confirmation #/EOC [id number] Status is pending

## 2023-01-29 ENCOUNTER — Other Ambulatory Visit (HOSPITAL_COMMUNITY): Payer: Self-pay

## 2023-01-29 NOTE — Telephone Encounter (Signed)
 Pharmacy Patient Advocate Encounter  Received notification from [facility] that Prior Authorization for ZEPBOUND  10MG /0.5ML has been APPROVED from 01/28/2023 to 09/28/2023. Ran test claim, Copay is $4.00. This test claim was processed through [facility]- copay amounts may vary at other pharmacies due to pharmacy/plan contracts, or as the patient moves through the different stages of their insurance plan.   PA Case ID #: [id number]

## 2023-01-31 ENCOUNTER — Other Ambulatory Visit (HOSPITAL_BASED_OUTPATIENT_CLINIC_OR_DEPARTMENT_OTHER): Payer: Self-pay

## 2023-02-10 ENCOUNTER — Other Ambulatory Visit (HOSPITAL_BASED_OUTPATIENT_CLINIC_OR_DEPARTMENT_OTHER): Payer: Self-pay

## 2023-02-25 ENCOUNTER — Other Ambulatory Visit: Payer: Self-pay | Admitting: Internal Medicine

## 2023-02-25 ENCOUNTER — Other Ambulatory Visit (HOSPITAL_BASED_OUTPATIENT_CLINIC_OR_DEPARTMENT_OTHER): Payer: Self-pay

## 2023-02-25 MED ORDER — ZEPBOUND 10 MG/0.5ML ~~LOC~~ SOAJ
10.0000 mg | SUBCUTANEOUS | 0 refills | Status: DC
  Filled 2023-02-25: qty 2, 28d supply, fill #0

## 2023-02-28 ENCOUNTER — Other Ambulatory Visit (HOSPITAL_BASED_OUTPATIENT_CLINIC_OR_DEPARTMENT_OTHER): Payer: Self-pay

## 2023-03-19 ENCOUNTER — Encounter: Payer: Self-pay | Admitting: Internal Medicine

## 2023-03-22 ENCOUNTER — Other Ambulatory Visit (HOSPITAL_BASED_OUTPATIENT_CLINIC_OR_DEPARTMENT_OTHER): Payer: Self-pay

## 2023-03-22 MED ORDER — TIRZEPATIDE-WEIGHT MANAGEMENT 12.5 MG/0.5ML ~~LOC~~ SOAJ
12.5000 mg | SUBCUTANEOUS | 2 refills | Status: DC
  Filled 2023-03-22: qty 2, 28d supply, fill #0

## 2023-04-14 ENCOUNTER — Other Ambulatory Visit: Payer: Self-pay | Admitting: Internal Medicine

## 2023-04-15 ENCOUNTER — Other Ambulatory Visit (HOSPITAL_BASED_OUTPATIENT_CLINIC_OR_DEPARTMENT_OTHER): Payer: Self-pay

## 2023-04-16 ENCOUNTER — Other Ambulatory Visit (HOSPITAL_BASED_OUTPATIENT_CLINIC_OR_DEPARTMENT_OTHER): Payer: Self-pay

## 2023-07-08 ENCOUNTER — Encounter: Payer: Self-pay | Admitting: Internal Medicine

## 2023-07-09 NOTE — Telephone Encounter (Signed)
**Note De-identified  [place] Obfuscation** Please advise 

## 2023-07-17 ENCOUNTER — Telehealth: Payer: Self-pay

## 2023-07-17 ENCOUNTER — Other Ambulatory Visit (HOSPITAL_COMMUNITY): Payer: Self-pay

## 2023-07-17 NOTE — Telephone Encounter (Signed)
 Pharmacy Patient Advocate Encounter   Received notification from Patient Advice Request messages that prior authorization for Zepbound  10mg /0.47ml is required/requested.   Insurance verification completed.   The patient is insured through [facility] .   Per test claim: PA required; PA submitted to above mentioned insurance via CoverMyMeds Key/confirmation #/EOC [id number] Status is pending

## 2023-07-21 ENCOUNTER — Other Ambulatory Visit (HOSPITAL_COMMUNITY): Payer: Self-pay

## 2023-07-21 NOTE — Telephone Encounter (Signed)
 Pharmacy Patient Advocate Encounter  Received notification from [facility] that Prior Authorization for Zepbound  10mg /0.79ml has been DENIED.  See denial reason below. No denial letter attached in CMM. Will attach denial letter to Media tab once received.   PA #/Case ID/Reference #: [id number]

## 2023-07-24 NOTE — Telephone Encounter (Signed)
 Information has been sent to clinical pharmacist for appeals review. It may take 5-7 days to prepare the necessary documentation to request the appeal from the insurance.

## 2023-07-24 NOTE — Telephone Encounter (Signed)
 I have written up a letter stating why this patient is needing this medication due to others not working and patient has tried the other alternatives outside zepbound 

## 2023-07-25 ENCOUNTER — Telehealth: Payer: Self-pay | Admitting: Pharmacist

## 2023-07-25 ENCOUNTER — Other Ambulatory Visit (HOSPITAL_COMMUNITY): Payer: Self-pay

## 2023-07-25 NOTE — Telephone Encounter (Signed)
 Appeal letter provided by Dr. Rollene has been submitted via fax  [phone]) to [facility] on 07/25/2023 @7 :13 am.  Thank you, Devere Pandy, PharmD Clinical Pharmacist  [place]  Direct Dial: [phone]

## 2023-08-08 ENCOUNTER — Other Ambulatory Visit: Payer: Self-pay | Admitting: Internal Medicine

## 2023-08-08 ENCOUNTER — Other Ambulatory Visit (HOSPITAL_BASED_OUTPATIENT_CLINIC_OR_DEPARTMENT_OTHER): Payer: Self-pay

## 2023-08-09 ENCOUNTER — Other Ambulatory Visit (HOSPITAL_BASED_OUTPATIENT_CLINIC_OR_DEPARTMENT_OTHER): Payer: Self-pay

## 2023-08-14 ENCOUNTER — Other Ambulatory Visit (HOSPITAL_COMMUNITY): Payer: Self-pay

## 2023-08-14 NOTE — Telephone Encounter (Unsigned)
 Copied from CRM [id number]. Topic: Clinical - Prescription Issue >> Aug 13, 2023  5:00 PM Winona R wrote: Pt is calling to follow up on this situation, as [employer] has not received anything from the office and the PT has reached out on 08/08/23 and still have yet to hear anything back from the office.   Note from 07/25/2023 Appeal letter provided by Dr. Rollene has been submitted via fax  [phone]) to [facility] [employer] on 07/25/2023 @7 :13 am.   Thank you, Devere Pandy, PharmD Clinical Pharmacist  [place]  Direct Dial: [phone]   Note from Pt on 08/08/2023 Just spoke with [employer]. They said that they had not received anything since the denial on July 3rd, which they sent over on July 4th.  For it to continue to be covered. Dr Rollene would have to document that we tried Wegovy  and it was Unsuccessful. They have my letter already, that I wrote from when they kept denying it when I was trying to get approved back when I started Wegovy .  She said that it will not continue to cover Zepbound  but the alternative (trizepitied) known as Monjuro. She said to have her include that when you guys send it over.

## 2023-08-14 NOTE — Telephone Encounter (Signed)
 Re-faxed to [facility] [phone].  Confirmation from today's fax is below:

## 2023-08-14 NOTE — Telephone Encounter (Signed)
 Placed a call to [facility] to check the status of the appeal.   Per the representative Apolinar, they could not find an appeal request on file for the patient. Requested we refax documents. An appeal can take up to 30 calendar days for determination.   Phone# [phone] Fax# [phone]

## 2023-08-15 ENCOUNTER — Encounter (HOSPITAL_BASED_OUTPATIENT_CLINIC_OR_DEPARTMENT_OTHER): Payer: Self-pay

## 2023-08-15 ENCOUNTER — Other Ambulatory Visit (HOSPITAL_BASED_OUTPATIENT_CLINIC_OR_DEPARTMENT_OTHER): Payer: Self-pay

## 2023-08-15 MED ORDER — ZEPBOUND 15 MG/0.5ML ~~LOC~~ SOAJ
15.0000 mg | SUBCUTANEOUS | 3 refills | Status: DC
  Filled 2023-08-15: qty 2, 28d supply, fill #0

## 2023-08-19 ENCOUNTER — Other Ambulatory Visit (HOSPITAL_COMMUNITY): Payer: Self-pay

## 2023-08-22 ENCOUNTER — Other Ambulatory Visit (HOSPITAL_COMMUNITY): Payer: Self-pay

## 2023-08-25 ENCOUNTER — Other Ambulatory Visit (HOSPITAL_COMMUNITY): Payer: Self-pay

## 2023-08-25 NOTE — Telephone Encounter (Signed)
 Spoke with insurance today, the appeal is still under review.  They were not able to provide an anticipated completion date at this time.

## 2023-08-26 ENCOUNTER — Other Ambulatory Visit (HOSPITAL_COMMUNITY): Payer: Self-pay

## 2023-08-26 NOTE — Telephone Encounter (Signed)
 Called to check on the appeal status and per the representative, it is in the review status and pending.   Phone# [phone]

## 2023-09-01 ENCOUNTER — Other Ambulatory Visit (HOSPITAL_COMMUNITY): Payer: Self-pay

## 2023-09-01 NOTE — Telephone Encounter (Signed)
 Should we try patient back on another weight loss medication?

## 2023-09-01 NOTE — Telephone Encounter (Signed)
 I don't know what are you asking?

## 2023-09-01 NOTE — Telephone Encounter (Signed)
 Pharmacy Patient Advocate Encounter  Received notification from [facility] that an Appeal for Zepbound  10mg /0.53ml has been DENIED.  See denial reason below. No denial letter attached in CMM. Will attach denial letter to Media tab once received.   PA #/Case ID/Reference #: [id number]

## 2023-09-01 NOTE — Telephone Encounter (Signed)
 Appeal was denied with including documentation. I have informed patient. Please advise

## 2023-09-02 NOTE — Telephone Encounter (Signed)
**Note De-identified  [place] Obfuscation** Please advise 

## 2023-09-02 NOTE — Telephone Encounter (Signed)
 Does pt know if any are covered?

## 2023-09-03 MED ORDER — SEMAGLUTIDE-WEIGHT MANAGEMENT 0.25 MG/0.5ML ~~LOC~~ SOAJ: 0.2500 mg | SUBCUTANEOUS | 0 refills | Status: AC

## 2023-09-03 MED ORDER — SEMAGLUTIDE-WEIGHT MANAGEMENT 0.5 MG/0.5ML ~~LOC~~ SOAJ: 0.5000 mg | SUBCUTANEOUS | 0 refills | Status: AC

## 2023-09-03 MED ORDER — SEMAGLUTIDE-WEIGHT MANAGEMENT 1.7 MG/0.75ML ~~LOC~~ SOAJ: 1.7000 mg | SUBCUTANEOUS | 0 refills | Status: AC

## 2023-09-03 MED ORDER — SEMAGLUTIDE-WEIGHT MANAGEMENT 2.4 MG/0.75ML ~~LOC~~ SOAJ: 2.4000 mg | SUBCUTANEOUS | 0 refills | Status: AC

## 2023-09-03 MED ORDER — SEMAGLUTIDE-WEIGHT MANAGEMENT 1 MG/0.5ML ~~LOC~~ SOAJ: 1.0000 mg | SUBCUTANEOUS | 0 refills | Status: AC

## 2023-09-03 NOTE — Telephone Encounter (Signed)
 I have sent in wegovy  and recommend to start with lowest dose which I have sent in.

## 2023-10-30 ENCOUNTER — Other Ambulatory Visit (HOSPITAL_COMMUNITY): Payer: Self-pay

## 2023-11-17 ENCOUNTER — Telehealth: Payer: Self-pay

## 2023-11-17 NOTE — Telephone Encounter (Signed)
 Copied from CRM #[id number]. Topic: Clinical - Medication Prior Auth >> Nov 17, 2023  4:45 PM China J wrote: Reason for CRM: Per the pharmacy, the patient's semaglutide -weight management (WEGOVY ) 1.1 MG is needing prior authorization.  Please call [phone] for an update.

## 2023-11-19 ENCOUNTER — Telehealth: Payer: Self-pay

## 2023-11-19 ENCOUNTER — Other Ambulatory Visit (HOSPITAL_COMMUNITY): Payer: Self-pay

## 2023-11-19 NOTE — Telephone Encounter (Signed)
 Pharmacy Patient Advocate Encounter   Received notification from Pt Calls Messages that prior authorization for Wegovy  1mg /0.38ml is required/requested.   Insurance verification completed.   The patient is insured through [facility].   Per test claim: PA required; PA submitted to above mentioned insurance via Latent Key/confirmation #/EOC [id number] Status is pending

## 2023-11-20 ENCOUNTER — Other Ambulatory Visit (HOSPITAL_COMMUNITY): Payer: Self-pay

## 2023-11-20 NOTE — Telephone Encounter (Signed)
 Pharmacy Patient Advocate Encounter  Received notification from [facility] that Prior Authorization for Wegovy   1mg /0.9ml has been APPROVED from 11/19/23 to 06/16/24   PA #/Case ID/Reference #: [id number]  Left a message at [facility] to notify of the approval

## 2023-12-10 ENCOUNTER — Encounter: Payer: Self-pay | Admitting: Internal Medicine

## 2023-12-10 ENCOUNTER — Ambulatory Visit (INDEPENDENT_AMBULATORY_CARE_PROVIDER_SITE_OTHER): Admitting: Internal Medicine

## 2023-12-10 ENCOUNTER — Ambulatory Visit

## 2023-12-10 VITALS — BP 120/80 | HR 65 | Temp 98.2°F | Ht 65.0 in | Wt 212.0 lb

## 2023-12-10 DIAGNOSIS — M5442 Lumbago with sciatica, left side: Secondary | ICD-10-CM

## 2023-12-10 MED ORDER — PREDNISONE 20 MG PO TABS: 40.0000 mg | ORAL_TABLET | Freq: Every day | ORAL | 0 refills | Status: AC

## 2023-12-10 MED ORDER — TRETINOIN 0.1 % EX CREA: TOPICAL_CREAM | CUTANEOUS | 0 refills | Status: AC

## 2023-12-10 MED ORDER — TRAMADOL HCL 50 MG PO TABS
50.0000 mg | ORAL_TABLET | Freq: Three times a day (TID) | ORAL | 0 refills | Status: AC | PRN
Start: 2023-12-10 — End: 2023-12-15

## 2023-12-10 MED ORDER — VITAMIN D (ERGOCALCIFEROL) 1.25 MG (50000 UNIT) PO CAPS: 50000.0000 [IU] | ORAL_CAPSULE | ORAL | 0 refills | Status: AC

## 2023-12-10 NOTE — Patient Instructions (Addendum)
 We will check the x-ray today.  We have sent in tramadol  to use for pain.  We have sent in prednisone  to ;help calm this down take 2 pills daily for 5 days.

## 2023-12-10 NOTE — Assessment & Plan Note (Signed)
 Symptoms ongoing and worsening for 3 weeks. Ordered x-ray to assess for change. Rx prednisone  and tramadol  to see if we can improve symptoms.

## 2023-12-10 NOTE — Progress Notes (Signed)
   Subjective:   Patient ID: Caroline Carlson, female    DOB: 30-Apr-1980, 43 y.o.   MRN: [id number]  Discussed the use of AI scribe software for clinical note transcription with the patient, who gave verbal consent to proceed.  History of Present Illness Caroline Carlson is a 43 year old female who presents with worsening low back pain.  Her back pain began several weeks ago without any specific incident or injury. The pain is primarily located in the low back, described as very uncomfortable and worsening over time. It is exacerbated by sitting and lying down, causing significant discomfort in the buttock area, but does not radiate down the legs. The pain is constant in the middle of the back and is uncomfortable across the whole lower back.  She has been using muscle relaxers and over-the-counter medications like naproxen  and Aleve , which only provide partial relief, allowing her to tolerate it enough to work. Heat application provides temporary relief. Previous treatments, including injections over a year ago, had been effective in managing her pain until recently.  No recent falls, injuries, or car accidents. No numbness, tingling, or burning sensation in the legs. She has experienced upper back pain in the past, which resolved with massage and topical treatments, but now the pain has shifted to the lower back.  Review of Systems  Constitutional:  Positive for activity change. Negative for appetite change, chills, fatigue, fever and unexpected weight change.  Respiratory: Negative.    Cardiovascular: Negative.   Gastrointestinal: Negative.   Musculoskeletal:  Positive for arthralgias, back pain and myalgias. Negative for gait problem and joint swelling.  Skin: Negative.   Neurological: Negative.     Objective:  Physical Exam Constitutional:      Appearance: She is well-developed.  HENT:     Head: Normocephalic and atraumatic.  Cardiovascular:     Rate and Rhythm: Normal rate  and regular rhythm.  Pulmonary:     Effort: Pulmonary effort is normal. No respiratory distress.     Breath sounds: Normal breath sounds. No wheezing or rales.  Abdominal:     General: Bowel sounds are normal. There is no distension.     Palpations: Abdomen is soft.     Tenderness: There is no abdominal tenderness.  Musculoskeletal:        General: Tenderness present.     Cervical back: Normal range of motion.     Comments: Tenderness midline low lumbar/sacral and bilateral SI joints  Skin:    General: Skin is warm and dry.  Neurological:     Mental Status: She is alert and oriented to person, place, and time.     Coordination: Coordination normal.     Vitals:   12/10/23 1110  BP: 120/80  Pulse: 65  Temp: 98.2 F (36.8 C)  TempSrc: Oral  SpO2: 96%  Weight: 212 lb (96.2 kg)  Height: 5' 5 (1.651 m)    Assessment and Plan Assessment & Plan Low back pain   Chronic low back pain has recently worsened. Previous injections relieved symptoms, indicating a recurring underlying issue. Possible causes include disc pathology or structural changes. Ordered a lumbar spine x-ray to evaluate these possibilities. Prescribed tramadol  for pain management and a short course of prednisone  to reduce inflammation.

## 2023-12-17 ENCOUNTER — Ambulatory Visit: Payer: Self-pay | Admitting: Internal Medicine

## 2024-01-02 ENCOUNTER — Other Ambulatory Visit: Payer: Self-pay | Admitting: Internal Medicine

## 2024-01-22 ENCOUNTER — Encounter: Payer: 59 | Admitting: Internal Medicine

## 2024-01-23 ENCOUNTER — Ambulatory Visit: Admitting: Internal Medicine

## 2024-01-23 ENCOUNTER — Encounter: Payer: Self-pay | Admitting: Internal Medicine

## 2024-01-23 ENCOUNTER — Other Ambulatory Visit: Payer: Self-pay | Admitting: Internal Medicine

## 2024-01-23 VITALS — BP 120/70 | HR 63 | Temp 98.3°F | Ht 65.0 in | Wt 212.8 lb

## 2024-01-23 DIAGNOSIS — L819 Disorder of pigmentation, unspecified: Secondary | ICD-10-CM | POA: Insufficient documentation

## 2024-01-23 DIAGNOSIS — R232 Flushing: Secondary | ICD-10-CM | POA: Insufficient documentation

## 2024-01-23 DIAGNOSIS — L709 Acne, unspecified: Secondary | ICD-10-CM | POA: Diagnosis not present

## 2024-01-23 DIAGNOSIS — Z1231 Encounter for screening mammogram for malignant neoplasm of breast: Secondary | ICD-10-CM

## 2024-01-23 DIAGNOSIS — Z0000 Encounter for general adult medical examination without abnormal findings: Secondary | ICD-10-CM

## 2024-01-23 DIAGNOSIS — G8929 Other chronic pain: Secondary | ICD-10-CM | POA: Diagnosis not present

## 2024-01-23 DIAGNOSIS — E559 Vitamin D deficiency, unspecified: Secondary | ICD-10-CM | POA: Diagnosis not present

## 2024-01-23 DIAGNOSIS — E7800 Pure hypercholesterolemia, unspecified: Secondary | ICD-10-CM | POA: Diagnosis not present

## 2024-01-23 DIAGNOSIS — M542 Cervicalgia: Secondary | ICD-10-CM

## 2024-01-23 DIAGNOSIS — R7303 Prediabetes: Secondary | ICD-10-CM

## 2024-01-23 DIAGNOSIS — M545 Low back pain, unspecified: Secondary | ICD-10-CM

## 2024-01-23 LAB — LIPID PANEL
Cholesterol: 241 mg/dL — ABNORMAL HIGH (ref 28–200)
HDL: 61.3 mg/dL
LDL Cholesterol: 160 mg/dL — ABNORMAL HIGH (ref 10–99)
NonHDL: 179.62
Total CHOL/HDL Ratio: 4
Triglycerides: 96 mg/dL (ref 10.0–149.0)
VLDL: 19.2 mg/dL (ref 0.0–40.0)

## 2024-01-23 LAB — COMPREHENSIVE METABOLIC PANEL WITH GFR
ALT: 19 U/L (ref 3–35)
AST: 19 U/L (ref 5–37)
Albumin: 4.2 g/dL (ref 3.5–5.2)
Alkaline Phosphatase: 84 U/L (ref 39–117)
BUN: 13 mg/dL (ref 6–23)
CO2: 24 meq/L (ref 19–32)
Calcium: 9.5 mg/dL (ref 8.4–10.5)
Chloride: 107 meq/L (ref 96–112)
Creatinine, Ser: 0.81 mg/dL (ref 0.40–1.20)
GFR: 88.61 mL/min
Glucose, Bld: 96 mg/dL (ref 70–99)
Potassium: 4 meq/L (ref 3.5–5.1)
Sodium: 140 meq/L (ref 135–145)
Total Bilirubin: 0.4 mg/dL (ref 0.2–1.2)
Total Protein: 7.3 g/dL (ref 6.0–8.3)

## 2024-01-23 LAB — CBC
HCT: 39.2 % (ref 36.0–46.0)
Hemoglobin: 13.2 g/dL (ref 12.0–15.0)
MCHC: 33.8 g/dL (ref 30.0–36.0)
MCV: 86.6 fl (ref 78.0–100.0)
Platelets: 308 K/uL (ref 150.0–400.0)
RBC: 4.52 Mil/uL (ref 3.87–5.11)
RDW: 14.4 % (ref 11.5–15.5)
WBC: 8.9 K/uL (ref 4.0–10.5)

## 2024-01-23 LAB — VITAMIN B12: Vitamin B-12: 951 pg/mL — ABNORMAL HIGH (ref 211–911)

## 2024-01-23 LAB — VITAMIN D 25 HYDROXY (VIT D DEFICIENCY, FRACTURES): VITD: 33.13 ng/mL (ref 30.00–100.00)

## 2024-01-23 LAB — HEMOGLOBIN A1C: Hgb A1c MFr Bld: 6.1 % (ref 4.6–6.5)

## 2024-01-23 MED ORDER — FINACEA 15 % EX FOAM: 0.1500 g | Freq: Two times a day (BID) | CUTANEOUS | 6 refills | Status: AC

## 2024-01-23 MED ORDER — HYDROQUINONE 4 % EX CREA: TOPICAL_CREAM | Freq: Two times a day (BID) | CUTANEOUS | 0 refills | Status: AC

## 2024-01-23 MED ORDER — ZEPBOUND 15 MG/0.5ML ~~LOC~~ SOAJ: 15.0000 mg | SUBCUTANEOUS | 3 refills | Status: AC

## 2024-01-23 MED ORDER — ESTRADIOL 0.025 MG/24HR TD PTTW: 1.0000 | MEDICATED_PATCH | TRANSDERMAL | 12 refills | Status: AC

## 2024-01-23 NOTE — Assessment & Plan Note (Signed)
 Refer to PT and neurosurgery due to more problems with this in the last few months.

## 2024-01-23 NOTE — Assessment & Plan Note (Signed)
 Checking vitamin D  and adjust as needed.

## 2024-01-23 NOTE — Assessment & Plan Note (Signed)
 Refer to neurosurgery as well as PT. She is using flexeril  and tramadol  for pain but having a lot of flares.

## 2024-01-23 NOTE — Progress Notes (Signed)
 "  Subjective:   Patient ID: Caroline Carlson, female    DOB: 1980/03/22, 44 y.o.   MRN: [id number]  The patient is here for physical. Pertinent topics discussed: Discussed the use of AI scribe software for clinical note transcription with the patient, who gave verbal consent to proceed.  History of Present Illness MEMORY HEINRICHS is a 44 year old female with chronic back pain and fibromyalgia who presents with worsening back and neck pain.  She has been experiencing persistent back pain that has worsened over the past year, with episodes of stiffness and tightness affecting her muscles. The pain radiates to her neck and down into her elbow. Previous medications provided some relief, but the pain has returned and lasted for almost a week. She recalls having an x-ray of her back in the past. She has a history of fibromyalgia.  She is experiencing severe hot flashes, acne, and increased hair growth. She had a hysterectomy in the past, so she is not having periods, but she is unsure if she is in menopause. The hot flashes cause profuse sweating, especially at night, disrupting her sleep. Friends and family have suggested these might be hot flashes.  She has been using Wegovy  for weight management but feels it is not effective in controlling her appetite or cravings for sweets. Initially, Wegovy  caused a bad cough, which improved with prednisone . She is trying to manage her hunger with protein shakes and is making efforts to maintain her protein intake. She is not sleeping well due to pain and hunger.  She reports chronic constipation, for which she takes magnesium  every night, and occasionally uses a magnesium  drink for relief. She is also taking a diabetic vitamin pack from [employer] to maintain her vitamin intake.  She describes experiencing anxiety, which her daughter has noticed. She finds herself sighing frequently, a behavior she has become more aware of recently. She is unsure of the cause but  acknowledges it might be related to hormonal changes.  She has persistent acne and hyperpigmentation spots on her face and chest, which have not resolved despite trying various treatments. She mentions a specific spot on her face that has been present for a couple of years and another on her chest.  No chest pain, tightness, or pressure, and her breathing is okay. She denies any new skin spots or moles.  PMH, [facility], social history reviewed and updated  Review of Systems  Constitutional:  Positive for activity change.  HENT: Negative.    Eyes: Negative.   Respiratory:  Negative for cough, chest tightness and shortness of breath.   Cardiovascular:  Negative for chest pain, palpitations and leg swelling.  Gastrointestinal:  Negative for abdominal distention, abdominal pain, constipation, diarrhea, nausea and vomiting.  Endocrine: Positive for heat intolerance.  Musculoskeletal:  Positive for arthralgias, back pain and neck pain.  Skin:  Positive for color change.  Neurological: Negative.   Psychiatric/Behavioral:  Positive for dysphoric mood. The patient is nervous/anxious.     Objective:  Physical Exam Constitutional:      Appearance: She is well-developed.  HENT:     Head: Normocephalic and atraumatic.  Cardiovascular:     Rate and Rhythm: Normal rate and regular rhythm.  Pulmonary:     Effort: Pulmonary effort is normal. No respiratory distress.     Breath sounds: Normal breath sounds. No wheezing or rales.  Abdominal:     General: Bowel sounds are normal. There is no distension.     Palpations: Abdomen is  soft.     Tenderness: There is no abdominal tenderness.  Musculoskeletal:     Cervical back: Normal range of motion.  Skin:    General: Skin is warm and dry.     Comments: Acne on the face  Neurological:     Mental Status: She is alert and oriented to person, place, and time.     Coordination: Coordination normal.     Vitals:   01/23/24 1033  BP: 120/70  Pulse: 63   Temp: 98.3 F (36.8 C)  TempSrc: Oral  SpO2: 97%  Weight: 212 lb 12.8 oz (96.5 kg)  Height: 5' 5 (1.651 m)    Assessment & Plan:   "

## 2024-01-23 NOTE — Assessment & Plan Note (Signed)
 Rx azelaic acid  for acne and using hormonal treatment to help as well.

## 2024-01-23 NOTE — Assessment & Plan Note (Signed)
 Rx estrogen patches and she will let us  know in 1 month if effective and if not we will increase the dose.

## 2024-01-23 NOTE — Assessment & Plan Note (Signed)
 Rx hydroquinone  4% to use on the areas on her face.

## 2024-01-23 NOTE — Patient Instructions (Addendum)
 We have sent in the estrogen patches to change twice weekly and give this 4 weeks to see if this helps.  We have sent in the cream to use on the dark places daily or twice a day.   We are checking the labs today.  We will get you in with PT and the back specialist.

## 2024-01-23 NOTE — Assessment & Plan Note (Addendum)
 BMi 35.4 and complicated by pre-diabetes. Rx zepbound  as she feels wegovy  is not helping her.

## 2024-01-23 NOTE — Assessment & Plan Note (Signed)
 Checking HgA1c and adjust as needed.

## 2024-01-23 NOTE — Assessment & Plan Note (Signed)
Checking lipid panel. Adjust as needed.  

## 2024-01-23 NOTE — Assessment & Plan Note (Signed)
 Flu shot declines. Tetanus due. Mammogram ordered, pap smear not indicated. Counseled about sun safety and mole surveillance. Counseled about the dangers of distracted driving. Given 10 year screening recommendations.

## 2024-01-26 ENCOUNTER — Ambulatory Visit: Payer: Self-pay | Admitting: Internal Medicine

## 2024-01-28 ENCOUNTER — Other Ambulatory Visit: Payer: Self-pay | Admitting: Internal Medicine

## 2024-01-28 DIAGNOSIS — R7303 Prediabetes: Secondary | ICD-10-CM

## 2024-01-28 MED ORDER — ROSUVASTATIN CALCIUM 20 MG PO TABS: 20.0000 mg | ORAL_TABLET | Freq: Every day | ORAL | 3 refills | Status: AC

## 2024-02-03 ENCOUNTER — Encounter: Payer: Self-pay | Admitting: Dietician

## 2024-02-03 ENCOUNTER — Encounter: Attending: Internal Medicine | Admitting: Dietician

## 2024-02-03 VITALS — Wt 216.0 lb

## 2024-02-03 DIAGNOSIS — R7303 Prediabetes: Secondary | ICD-10-CM | POA: Diagnosis present

## 2024-02-03 NOTE — Progress Notes (Signed)
 Medical Nutrition Therapy  Appointment Start time:  1650  Appointment End time:  1735  Referral diagnosis: prediabetes Preferred learning style: no preference indicated Learning readiness: ready   NUTRITION ASSESSMENT   Anthropometrics  Ht: 65in Wt 12/12/21: 237 lbs Wt 01/31/22: 241 lbs Wt 04/01/22: 241 lbs Wt 02/03/24: 216 lb   Clinical Medical Hx: anemia, anxiety, blood transfusion, depression, HLD, nerve/muscle disease Medications: semaglutide  Labs: 01/23/24: chol 241, LDL 160, A1c 6.1 Notable Signs/Symptoms: constipation Food Allergies: shellfish  Lifestyle & Dietary Hx  Pt reports she is still on wegovy . Pt reports she was in tirzepatide  for a while and found it to help more, but switched back to semaglutide . Pt reports when she was on tirzepatide  she was working out more consistently and eating healthier.   Pt reports she feels her fibromyalgia has been worse. Pt reports she has been dealing with increased back pain preventing her from exercising as much as she would like.   Pt reports her kids are now 25, 21, and 23. Pt reports she is still driving them around a lot, including taking 2 of them to [employer] daily for classes, and usually sits in the car during.   Pt reports she is still skipping meals frequently. Pt reports she hasn't been cooking much due to back pain.   Pt reports her dad passed and she feels she fell off track, but then got back on track and feels she is off track again.   Pt reports she has concerns for her cholesterol.   Estimated daily fluid intake: 64-72 oz Supplements: vitamin D , magnesium  Sleep: 5 hours, pt states she has been sleeping better.  Stress / self-care: high stress Current average weekly physical activity: ADLs, gym in inconsistent right now due to pain, went 3 times last week.   24-Hr Dietary Recall First Meal: none Snack: granola bar Second Meal: none Snack: none Third Meal: chinese food OR mexican restaurant Snack: none   Beverages: water, occasional cranberry juice or ginger ale   NUTRITION DIAGNOSIS  NB-1.1 Food and nutrition-related knowledge deficit As related to prediabetes.  As evidenced by pt report and diet history.   NUTRITION INTERVENTION  Nutrition education (E-1) on the following topics:  Importance of consistency with eating including having smaller, more frequent meals and snacks when appetite is low. Include a complex carb and protein source. Discussed impact of adequate hydration on health and assessed pts current water intake and goals. Discussed nutritional side effects of wegovy  medication with pt and strategies to maintain enough protein intake with reduced appetite. Physical Activity: Aim for 60 minutes of physical activity daily. Regular physical activity promotes overall health-including helping to reduce risk for heart disease and diabetes, promoting mental health, and helping us  sleep better.  Discussed vitamin D  status, sources, supplementation.   LDL Lowering Nutrition Therapy Soluble fiber and impact on lowering cholesterol: Sources: Oats, barley, beans, lentils, fruits (apples, oranges), vegetables (carrots, broccoli), and flaxseeds. Benefits: Soluble fiber reduces the absorption of cholesterol into your bloodstream. Choose Healthy Unsaturated Fats and Omega 3's. Sources: Olive oil, canola oil, avocados, nuts, and Fatty fish (salmon, trout), walnuts, flaxseeds, and chia seeds. Benefits: Helps raise HDL cholesterol and lower LDL cholesterol. Omega-3s help reduce triglycerides and raise HDL cholesterol. Limit Saturated Fats: Sources: Red meat, full-fat dairy products, butter, and coconut oil. Benefits: Reducing intake helps lower LDL cholesterol levels. Eat Plenty of Fruits and Vegetables. Benefits: High in fiber and antioxidants, which help lower LDL cholesterol. Eat Whole Grains. Sources: [employer],  whole wheat bread, quinoa, and whole grain pasta. Benefits: Whole grains contain  more fiber and nutrients than refined grains. Regular Physical Activity: Aim for at least 30 minutes of moderate-intensity exercise most days of the week.   Handouts Provided Include  No handouts provided on this follow-up  Learning Style & Readiness for Change Teaching method utilized: Visual & Auditory  Demonstrated degree of understanding via: Teach Back  Barriers to learning/adherence to lifestyle change: none  Goals Established by Pt  Assessment/Continuation of Previous Goals: Exercise 5 days per week. - goal not met, continue.  Aim to make 1/2 of your plate vegetables and/or fruit at least 2x/day - in progress Make simple meals at home more often than eating out. - in progress Aim to eat within 1-2 hours of waking up and every 3-5 hours following. - in progress  New Goal:  Eat at least 2 meals per day.   Ask your kids to hold you accountable on nutrition (packing snacks).    MONITORING & EVALUATION Dietary intake, weekly physical activity, and follow up in 6 weeks  Next Steps  Patient is to call for questions.

## 2024-02-03 NOTE — Patient Instructions (Signed)
 LDL Lowering Nutrition Therapy Soluble fiber and impact on lowering cholesterol: Sources: Oats, barley, beans, lentils, fruits (apples, oranges), vegetables (carrots, broccoli), and flaxseeds. Benefits: Soluble fiber reduces the absorption of cholesterol into your bloodstream. Choose Healthy Unsaturated Fats and Omega 3's. Sources: Olive oil, canola oil, avocados, nuts, and Fatty fish (salmon, trout), walnuts, flaxseeds, and chia seeds. Benefits: Helps raise HDL cholesterol and lower LDL cholesterol. Omega-3s help reduce triglycerides and raise HDL cholesterol. Limit Saturated Fats: Sources: Red meat, full-fat dairy products, butter, and coconut oil. Benefits: Reducing intake helps lower LDL cholesterol levels. Eat Plenty of Fruits and Vegetables. Benefits: High in fiber and antioxidants, which help lower LDL cholesterol. Eat Whole Grains. Sources: Brown rice, whole wheat bread, quinoa, and whole grain pasta. Benefits: Whole grains contain more fiber and nutrients than refined grains. Regular Physical Activity: Aim for at least 30 minutes of moderate-intensity exercise most days of the week.

## 2024-02-11 ENCOUNTER — Ambulatory Visit
Admission: RE | Admit: 2024-02-11 | Discharge: 2024-02-11 | Disposition: A | Source: Ambulatory Visit | Attending: Internal Medicine | Admitting: Internal Medicine

## 2024-02-11 DIAGNOSIS — Z1231 Encounter for screening mammogram for malignant neoplasm of breast: Secondary | ICD-10-CM

## 2024-02-17 ENCOUNTER — Other Ambulatory Visit: Payer: Self-pay | Admitting: Internal Medicine

## 2024-02-17 DIAGNOSIS — R928 Other abnormal and inconclusive findings on diagnostic imaging of breast: Secondary | ICD-10-CM

## 2024-02-18 ENCOUNTER — Other Ambulatory Visit: Payer: Self-pay | Admitting: Internal Medicine

## 2024-02-18 ENCOUNTER — Inpatient Hospital Stay: Admission: RE | Admit: 2024-02-18 | Discharge: 2024-02-18 | Attending: Internal Medicine | Admitting: Internal Medicine

## 2024-02-18 DIAGNOSIS — R928 Other abnormal and inconclusive findings on diagnostic imaging of breast: Secondary | ICD-10-CM

## 2024-02-19 ENCOUNTER — Other Ambulatory Visit: Payer: Self-pay | Admitting: Internal Medicine

## 2024-02-19 DIAGNOSIS — R928 Other abnormal and inconclusive findings on diagnostic imaging of breast: Secondary | ICD-10-CM

## 2024-02-20 ENCOUNTER — Telehealth: Payer: Self-pay

## 2024-02-20 NOTE — Telephone Encounter (Signed)
 Copied from CRM [id number]. Topic: Clinical - Prescription Issue >> Feb 20, 2024  9:14 AM Tonda B wrote: Reason for CRM: patient needs a prior authorization tirzepatide  (ZEPBOUND ) 15 MG/0.5ML Pen please call pt back [phone]

## 2024-02-25 ENCOUNTER — Encounter

## 2024-03-16 ENCOUNTER — Encounter: Admitting: Dietician
